# Patient Record
Sex: Male | Born: 1950 | Race: White | Hispanic: No | Marital: Married | Smoking: Former smoker
Health system: Southern US, Community
[De-identification: ages and names within clinical notes are randomized; demographics above are authoritative.]

## PROBLEM LIST (undated history)

## (undated) DIAGNOSIS — I10 Essential (primary) hypertension: Secondary | ICD-10-CM

## (undated) DIAGNOSIS — K429 Umbilical hernia without obstruction or gangrene: Secondary | ICD-10-CM

## (undated) DIAGNOSIS — E039 Hypothyroidism, unspecified: Secondary | ICD-10-CM

## (undated) DIAGNOSIS — E785 Hyperlipidemia, unspecified: Secondary | ICD-10-CM

## (undated) DIAGNOSIS — M1990 Unspecified osteoarthritis, unspecified site: Secondary | ICD-10-CM

## (undated) DIAGNOSIS — T7840XA Allergy, unspecified, initial encounter: Secondary | ICD-10-CM

## (undated) DIAGNOSIS — K469 Unspecified abdominal hernia without obstruction or gangrene: Secondary | ICD-10-CM

## (undated) DIAGNOSIS — I2510 Atherosclerotic heart disease of native coronary artery without angina pectoris: Secondary | ICD-10-CM

## (undated) DIAGNOSIS — N183 Chronic kidney disease, stage 3 unspecified: Secondary | ICD-10-CM

## (undated) HISTORY — DX: Hypothyroidism, unspecified: E03.9

## (undated) HISTORY — DX: Unspecified osteoarthritis, unspecified site: M19.90

## (undated) HISTORY — DX: Umbilical hernia without obstruction or gangrene: K42.9

## (undated) HISTORY — PX: KNEE CARTILAGE SURGERY: SHX688

## (undated) HISTORY — DX: Atherosclerotic heart disease of native coronary artery without angina pectoris: I25.10

## (undated) HISTORY — DX: Hyperlipidemia, unspecified: E78.5

## (undated) HISTORY — DX: Allergy, unspecified, initial encounter: T78.40XA

## (undated) HISTORY — DX: Chronic kidney disease, stage 3 unspecified: N18.30

## (undated) HISTORY — DX: Essential (primary) hypertension: I10

## (undated) HISTORY — DX: Unspecified abdominal hernia without obstruction or gangrene: K46.9

---

## 2006-11-14 ENCOUNTER — Emergency Department: Payer: Self-pay | Admitting: Emergency Medicine

## 2006-11-14 ENCOUNTER — Other Ambulatory Visit: Payer: Self-pay

## 2015-01-17 DIAGNOSIS — K429 Umbilical hernia without obstruction or gangrene: Secondary | ICD-10-CM

## 2015-01-17 HISTORY — DX: Umbilical hernia without obstruction or gangrene: K42.9

## 2015-03-04 ENCOUNTER — Encounter: Payer: Self-pay | Admitting: Family Medicine

## 2015-03-04 ENCOUNTER — Ambulatory Visit (INDEPENDENT_AMBULATORY_CARE_PROVIDER_SITE_OTHER): Payer: Medicare Other | Admitting: Family Medicine

## 2015-03-04 ENCOUNTER — Ambulatory Visit
Admission: RE | Admit: 2015-03-04 | Discharge: 2015-03-04 | Disposition: A | Discharge status: Home / Self Care | Payer: Medicare Other | Source: Ambulatory Visit | Attending: Family Medicine | Admitting: Family Medicine

## 2015-03-04 VITALS — BP 196/100 | HR 69 | Temp 97.5°F | Ht 71.6 in | Wt 229.0 lb

## 2015-03-04 DIAGNOSIS — K429 Umbilical hernia without obstruction or gangrene: Secondary | ICD-10-CM

## 2015-03-04 DIAGNOSIS — Z1159 Encounter for screening for other viral diseases: Secondary | ICD-10-CM | POA: Diagnosis not present

## 2015-03-04 DIAGNOSIS — Z13220 Encounter for screening for lipoid disorders: Secondary | ICD-10-CM | POA: Diagnosis not present

## 2015-03-04 DIAGNOSIS — I10 Essential (primary) hypertension: Secondary | ICD-10-CM

## 2015-03-04 DIAGNOSIS — Z125 Encounter for screening for malignant neoplasm of prostate: Secondary | ICD-10-CM

## 2015-03-04 DIAGNOSIS — Z114 Encounter for screening for human immunodeficiency virus [HIV]: Secondary | ICD-10-CM | POA: Diagnosis not present

## 2015-03-04 DIAGNOSIS — M160 Bilateral primary osteoarthritis of hip: Secondary | ICD-10-CM | POA: Diagnosis not present

## 2015-03-04 DIAGNOSIS — M25551 Pain in right hip: Secondary | ICD-10-CM | POA: Diagnosis present

## 2015-03-04 MED ORDER — MELOXICAM 15 MG PO TABS: 15.0000 mg | ORAL_TABLET | Freq: Every day | ORAL | Status: DC

## 2015-03-04 MED ORDER — LISINOPRIL 10 MG PO TABS: 10.0000 mg | ORAL_TABLET | Freq: Every day | ORAL | Status: DC

## 2015-03-04 NOTE — Assessment & Plan Note (Signed)
Concern for significant arthritis. Will obtain x-ray and start exercises and mobic. If significant arthritis, will refer to orthopedist.

## 2015-03-04 NOTE — Progress Notes (Signed)
BP 196/100 mmHg  Pulse 69  Temp(Src) 97.5 F (36.4 C)  Ht 5' 11.6" (1.819 m)  Wt 229 lb (103.874 kg)  BMI 31.39 kg/m2  SpO2 96%   Subjective:    Patient ID: Kenneth Johnston, male    DOB: 1951-01-12, 65 y.o.   MRN: [id number]  HPI: MCKADE Johnston is a 65 y.o. male who presents today to establish care. Hasn't seen a doctor in a very very long time.  Chief Complaint  Patient presents with  . Back Pain  . Hip Pain   HIP PAIN Duration: 4-5 months Involved hip: right and into the back Mechanism of injury: unknown Location: posterior Onset: sudden  Severity: 7/10  Quality: tight, sharp and tearing Frequency: constant Radiation: yes into his back Aggravating factors: walking and bending   Alleviating factors: chiropractry and laying in the recliner  Status: worse Treatments attempted: rest, ice, heat, APAP, ibuprofen, aleve and chiropractor   Relief with NSAIDs?: no Weakness with weight bearing: no Weakness with walking: no Paresthesias / decreased sensation: no Swelling: no Redness:no Fevers: no  ELEVATED BLOOD PRESSURE Duration of elevated BP: unknown BP monitoring frequency: not checking Previous BP meds: none Recent stressors: no Family history of hypertension: no Recurrent headaches: no Visual changes: no Palpitations: no  Dyspnea: yes Chest pain: no Lower extremity edema: no Dizzy/lightheaded: yes going from sitting to standing Transient ischemic attacks:no  HERNIA Duration: x 8 years Location:  belly button Painful: no Discomfort: yes Bulge: yes Quality:  tearing Onset: sudden Severity: moderate Context: bigger and better Aggravating factors: lifting  Relevant past medical, surgical, family and social history reviewed and updated as indicated. Interim medical history since our last visit reviewed. Allergies and medications reviewed and updated.  Review of Systems  Constitutional: Negative.   Respiratory: Negative.   Cardiovascular:  Negative.   Musculoskeletal: Positive for back pain, arthralgias and gait problem. Negative for myalgias, joint swelling, neck pain and neck stiffness.  Psychiatric/Behavioral: Negative.     Per HPI unless specifically indicated above     Objective:    BP 196/100 mmHg  Pulse 69  Temp(Src) 97.5 F (36.4 C)  Ht 5' 11.6" (1.819 m)  Wt 229 lb (103.874 kg)  BMI 31.39 kg/m2  SpO2 96%  Wt Readings from Last 3 Encounters:  03/04/15 229 lb (103.874 kg)    Physical Exam  Constitutional: He is oriented to person, place, and time. He appears well-developed and well-nourished. No distress.  HENT:  Head: Normocephalic and atraumatic.  Right Ear: Hearing normal.  Left Ear: Hearing normal.  Nose: Nose normal.  Eyes: Conjunctivae and lids are normal. Right eye exhibits no discharge. Left eye exhibits no discharge. No scleral icterus.  Cardiovascular: Normal rate, regular rhythm, normal heart sounds and intact distal pulses.  Exam reveals no gallop and no friction rub.   No murmur heard. Pulmonary/Chest: Effort normal and breath sounds normal. No respiratory distress. He has no wheezes. He has no rales. He exhibits no tenderness.  Abdominal:  Hernia at the belly button   Musculoskeletal: Normal range of motion.  Neurological: He is alert and oriented to person, place, and time.  Skin: Skin is warm, dry and intact. No rash noted. No erythema. No pallor.  Psychiatric: He has a normal mood and affect. His speech is normal and behavior is normal. Judgment and thought content normal. Cognition and memory are normal.  Nursing note and vitals reviewed. Hip Exam: Right     Tenderness to palpation:  Greater trochanter: no      Anterior superior iliac spine: yes     Anterior hip: yes     Iliac crest: yes     Iliac tubercle: yes     Pubic tubercle: yes     SI joint: yes      Range of Motion:     Flexion: Decreased    Extension: Decreased    Abduction: Decreased    Adduction: Decreased     Internal rotation: Decreased    External rotation: Decreased     Muscle Strength:  5/5 bilaterally     Special Tests:    Ober test: negative    FABER test:positive    No results found for this or any previous visit.    Assessment & Plan:   Problem List Items Addressed This Visit      Cardiovascular and Mediastinum   HTN (hypertension)    Poorly controlled today. Will start him on lisinopril and recheck in 2 weeks. Labs checked today.      Relevant Medications   lisinopril (PRINIVIL,ZESTRIL) 10 MG tablet   Other Relevant Orders   CBC with Differential/Platelet   Comprehensive metabolic panel   Microalbumin, Urine Waived   TSH     Other   Pain in right hip - Primary    Concern for significant arthritis. Will obtain x-ray and start exercises and mobic. If significant arthritis, will refer to orthopedist.      Relevant Orders   DG HIP UNILAT WITH PELVIS 2-3 VIEWS RIGHT   Umbilical hernia    Not strangulated. Would like to see surgery. Referral generated today.      Relevant Orders   Ambulatory referral to General Surgery    Other Visit Diagnoses    Screening for HIV without presence of risk factors        Labs drawn today. No risk. Await results.     Need for hepatitis C screening test        Labs drawn today. No risk. Await results.     Relevant Orders    HIV antibody    Hepatitis C Antibody    Screening for prostate cancer        Labs drawn today.  Await results.     Relevant Orders    PSA    Screening for cholesterol level        Labs drawn today. Await results.     Relevant Orders    Lipid Panel w/o Chol/HDL Ratio        Follow up plan: Return in about 2 weeks (around 03/18/2015) for recheck BP.

## 2015-03-04 NOTE — Patient Instructions (Signed)
Generic Hip Exercises RANGE OF MOTION (ROM) AND STRETCHING EXERCISES  These exercises may help you when beginning to rehabilitate your injury. Doing them too aggressively can worsen your condition. Complete them slowly and gently. Your symptoms may resolve with or without further involvement from your physician, physical therapist or athletic trainer. While completing these exercises, remember:   Restoring tissue flexibility helps normal motion to return to the joints. This allows healthier, less painful movement and activity.  An effective stretch should be held for at least 30 seconds.  A stretch should never be painful. You should only feel a gentle lengthening or release in the stretched tissue. If these stretches worsen your symptoms even when done gently, consult your physician, physical therapist or athletic trainer. STRETCH - Hamstrings, Supine   Lie on your back. Loop a belt or towel over the ball of your right / left foot.  Straighten your right / left knee and slowly pull on the belt to raise your leg. Do not allow the right / left knee to bend. Keep your opposite leg flat on the floor.  Raise the leg until you feel a gentle stretch behind your right / left knee or thigh. Hold this position for __________ seconds. Repeat __________ times. Complete this stretch __________ times per day.  STRETCH - Hip Rotators   Lie on your back on a firm surface. Grasp your right / left knee with your right / left hand and your ankle with your opposite hand.  Keeping your hips and shoulders firmly planted, gently pull your right / left knee and rotate your lower leg toward your opposite shoulder until you feel a stretch in your buttocks.  Hold this stretch for __________ seconds. Repeat this stretch __________ times. Complete this stretch __________ times per day. STRETCH - Hamstrings/Adductors, V-Sit   Sit on the floor with your legs extended in a large "V," keeping your knees straight.  With  your head and chest upright, bend at your waist reaching for your right foot to stretch your left adductors.  You should feel a stretch in your left inner thigh. Hold for __________ seconds.  Return to the upright position to relax your leg muscles.  Continuing to keep your chest upright, bend straight forward at your waist to stretch your hamstrings.  You should feel a stretch behind both of your thighs and/or knees. Hold for __________ seconds.  Return to the upright position to relax your leg muscles.  Repeat steps 2 through 4 for opposite leg. Repeat __________ times. Complete this exercise __________ times per day.  STRETCHING - Hip Flexors, Lunge  Half kneel with your right / left knee on the floor and your opposite knee bent and directly over your ankle.  Keep good posture with your head over your shoulders. Tighten your buttocks to point your tailbone downward; this will prevent your back from arching too much.  You should feel a gentle stretch in the front of your thigh and/or hip. If you do not feel any resistance, slightly slide your opposite foot forward and then slowly lunge forward so your knee once again lines up over your ankle. Be sure your tailbone remains pointed downward.  Hold this stretch for __________ seconds. Repeat __________ times. Complete this stretch __________ times per day. STRENGTHENING EXERCISES These exercises may help you when beginning to rehabilitate your injury. They may resolve your symptoms with or without further involvement from your physician, physical therapist or athletic trainer. While completing these exercises, remember:     Muscles can gain both the endurance and the strength needed for everyday activities through controlled exercises.  Complete these exercises as instructed by your physician, physical therapist or athletic trainer. Progress the resistance and repetitions only as guided.  You may experience muscle soreness or fatigue, but  the pain or discomfort you are trying to eliminate should never worsen during these exercises. If this pain does worsen, stop and make certain you are following the directions exactly. If the pain is still present after adjustments, discontinue the exercise until you can discuss the trouble with your clinician. STRENGTH - Hip Extensors, Bridge   Lie on your back on a firm surface. Bend your knees and place your feet flat on the floor.  Tighten your buttocks muscles and lift your bottom off the floor until your trunk is level with your thighs. You should feel the muscles in your buttocks and back of your thighs working. If you do not feel these muscles, slide your feet 1-2 inches further away from your buttocks.  Hold this position for __________ seconds.  Slowly lower your hips to the starting position and allow your buttock muscles relax completely before beginning the next repetition.  If this exercise is too easy, you may cross your arms over your chest. Repeat __________ times. Complete this exercise __________ times per day.  STRENGTH - Hip Abductors, Straight Leg Raises  Be aware of your form throughout the entire exercise so that you exercise the correct muscles. Sloppy form means that you are not strengthening the correct muscles.  Lie on your side so that your head, shoulders, knee and hip line up. You may bend your lower knee to help maintain your balance. Your right / left leg should be on top.  Roll your hips slightly forward, so that your hips are stacked directly over each other and your right / left knee is facing forward.  Lift your top leg up 4-6 inches, leading with your heel. Be sure that your foot does not drift forward or that your knee does not roll toward the ceiling.  Hold this position for __________ seconds. You should feel the muscles in your outer hip lifting (you may not notice this until your leg begins to tire).  Slowly lower your leg to the starting position.  Allow the muscles to fully relax before beginning the next repetition. Repeat __________ times. Complete this exercise __________ times per day.  STRENGTH - Hip Adductors, Straight Leg Raises   Lie on your side so that your head, shoulders, knee and hip line up. You may place your upper foot in front to help maintain your balance. Your right / left leg should be on the bottom.  Roll your hips slightly forward, so that your hips are stacked directly over each other and your right / left knee is facing forward.  Tense the muscles in your inner thigh and lift your bottom leg 4-6 inches. Hold this position for __________ seconds.  Slowly lower your leg to the starting position. Allow the muscles to fully relax before beginning the next repetition. Repeat __________ times. Complete this exercise __________ times per day.  STRENGTH - Quadriceps, Straight Leg Raises  Quality counts! Watch for signs that the quadriceps muscle is working to insure you are strengthening the correct muscles and not "cheating" by substituting with healthier muscles.  Lay on your back with your right / left leg extended and your opposite knee bent.  Tense the muscles in the front of your right /   left thigh. You should see either your knee cap slide up or increased dimpling just above the knee. Your thigh may even quiver.  Tighten these muscles even more and raise your leg 4 to 6 inches off the floor. Hold for right / left seconds.  Keeping these muscles tense, lower your leg.  Relax the muscles slowly and completely in between each repetition. Repeat __________ times. Complete this exercise __________ times per day.  STRENGTH - Hip Abductors, Standing  Tie one end of a rubber exercise band/tubing to a secure surface (table, pole) and tie a loop at the other end.  Place the loop around your right / left ankle. Keeping your ankle with the band directly opposite of the secured end, step away until there is tension in  the tube/band.  Hold onto a chair as needed for balance.  Keeping your back upright, your shoulders over your hips, and your toes pointing forward, lift your right / left leg out to your side. Be sure to lift your leg with your hip muscles. Do not "throw" your leg or tip your body to lift your leg.  Slowly and with control, return to the starting position. Repeat exercise __________ times. Complete this exercise __________ times per day.  STRENGTH - Quadriceps, Squats  Stand in a door frame so that your feet and knees are in line with the frame.  Use your hands for balance, not support, on the frame.  Slowly lower your weight, bending at the hips and knees. Keep your lower legs upright so that they are parallel with the door frame. Squat only within the range that does not increase your knee pain. Never let your hips drop below your knees.  Slowly return upright, pushing with your legs, not pulling with your hands.   This information is not intended to replace advice given to you by your health care provider. Make sure you discuss any questions you have with your health care provider.   Document Released: 01/20/2005 Document Revised: 01/23/2014 Document Reviewed: 04/16/2008 Elsevier Interactive Patient Education 2016 [employer]  

## 2015-03-04 NOTE — Assessment & Plan Note (Signed)
Not strangulated. Would like to see surgery. Referral generated today.

## 2015-03-04 NOTE — Assessment & Plan Note (Signed)
Poorly controlled today. Will start him on lisinopril and recheck in 2 weeks. Labs checked today.

## 2015-03-05 ENCOUNTER — Telehealth: Payer: Self-pay | Admitting: Family Medicine

## 2015-03-05 ENCOUNTER — Telehealth: Payer: Self-pay | Admitting: Gastroenterology

## 2015-03-05 DIAGNOSIS — M160 Bilateral primary osteoarthritis of hip: Secondary | ICD-10-CM

## 2015-03-05 LAB — PSA: Prostate Specific Ag, Serum: 1.7 ng/mL (ref 0.0–4.0)

## 2015-03-05 LAB — MICROALBUMIN, URINE WAIVED
CREATININE, URINE WAIVED: 200 mg/dL (ref 10–300)
MICROALB, UR WAIVED: 80 mg/L — AB (ref 0–19)

## 2015-03-05 LAB — LIPID PANEL W/O CHOL/HDL RATIO
CHOLESTEROL TOTAL: 146 mg/dL (ref 100–199)
HDL: 34 mg/dL — ABNORMAL LOW (ref >39)
LDL Calculated: 87 mg/dL (ref 0–99)
Triglycerides: 126 mg/dL (ref 0–149)
VLDL CHOLESTEROL CAL: 25 mg/dL (ref 5–40)

## 2015-03-05 LAB — COMPREHENSIVE METABOLIC PANEL
A/G RATIO: 1.8 (ref 1.1–2.5)
ALK PHOS: 76 IU/L (ref 39–117)
ALT: 15 IU/L (ref 0–44)
AST: 15 IU/L (ref 0–40)
Albumin: 4.4 g/dL (ref 3.6–4.8)
BILIRUBIN TOTAL: 0.3 mg/dL (ref 0.0–1.2)
BUN/Creatinine Ratio: 11 (ref 10–22)
BUN: 12 mg/dL (ref 8–27)
CHLORIDE: 102 mmol/L (ref 96–106)
CO2: 22 mmol/L (ref 18–29)
Calcium: 9.4 mg/dL (ref 8.6–10.2)
Creatinine, Ser: 1.07 mg/dL (ref 0.76–1.27)
GFR calc Af Amer: 83 mL/min/{1.73_m2} (ref >59)
GFR calc non Af Amer: 72 mL/min/{1.73_m2} (ref >59)
GLOBULIN, TOTAL: 2.5 g/dL (ref 1.5–4.5)
Glucose: 91 mg/dL (ref 65–99)
POTASSIUM: 4 mmol/L (ref 3.5–5.2)
SODIUM: 144 mmol/L (ref 134–144)
Total Protein: 6.9 g/dL (ref 6.0–8.5)

## 2015-03-05 LAB — CBC WITH DIFFERENTIAL/PLATELET
BASOS: 1 %
Basophils Absolute: 0 10*3/uL (ref 0.0–0.2)
EOS (ABSOLUTE): 0.1 10*3/uL (ref 0.0–0.4)
EOS: 1 %
HEMATOCRIT: 43.8 % (ref 37.5–51.0)
Hemoglobin: 15.1 g/dL (ref 12.6–17.7)
IMMATURE GRANULOCYTES: 0 %
Immature Grans (Abs): 0 10*3/uL (ref 0.0–0.1)
LYMPHS ABS: 1.5 10*3/uL (ref 0.7–3.1)
Lymphs: 19 %
MCH: 28.6 pg (ref 26.6–33.0)
MCHC: 34.5 g/dL (ref 31.5–35.7)
MCV: 83 fL (ref 79–97)
MONOS ABS: 0.6 10*3/uL (ref 0.1–0.9)
Monocytes: 7 %
NEUTROS PCT: 72 %
Neutrophils Absolute: 6 10*3/uL (ref 1.4–7.0)
PLATELETS: 216 10*3/uL (ref 150–379)
RBC: 5.28 x10E6/uL (ref 4.14–5.80)
RDW: 14.3 % (ref 12.3–15.4)
WBC: 8.2 10*3/uL (ref 3.4–10.8)

## 2015-03-05 LAB — HIV ANTIBODY (ROUTINE TESTING W REFLEX): HIV SCREEN 4TH GENERATION: NONREACTIVE

## 2015-03-05 LAB — HEPATITIS C ANTIBODY

## 2015-03-05 LAB — TSH: TSH: 2.21 u[IU]/mL (ref 0.450–4.500)

## 2015-03-05 NOTE — Telephone Encounter (Signed)
I have called patient to make an appointment with general surgery for Umbilical hernia without obstruction and without gangrene per referral. No answer. I have left a detailed message.

## 2015-03-05 NOTE — Telephone Encounter (Signed)
Please let him know that his labs came back normal. His x-ray shows a lot of arthritis, so I put in the referral for him to see the orthopedist. He should be hearing from them shortly. Thanks!

## 2015-03-05 NOTE — Telephone Encounter (Signed)
Patient notified

## 2015-03-10 ENCOUNTER — Encounter (INDEPENDENT_AMBULATORY_CARE_PROVIDER_SITE_OTHER): Payer: Self-pay

## 2015-03-10 ENCOUNTER — Encounter: Payer: Self-pay | Admitting: Surgery

## 2015-03-10 ENCOUNTER — Ambulatory Visit (INDEPENDENT_AMBULATORY_CARE_PROVIDER_SITE_OTHER): Payer: Medicare Other | Admitting: Surgery

## 2015-03-10 VITALS — BP 188/100 | HR 62 | Temp 98.0°F | Ht 71.0 in | Wt 229.0 lb

## 2015-03-10 DIAGNOSIS — K429 Umbilical hernia without obstruction or gangrene: Secondary | ICD-10-CM | POA: Diagnosis not present

## 2015-03-10 DIAGNOSIS — K469 Unspecified abdominal hernia without obstruction or gangrene: Secondary | ICD-10-CM | POA: Insufficient documentation

## 2015-03-10 DIAGNOSIS — K4020 Bilateral inguinal hernia, without obstruction or gangrene, not specified as recurrent: Secondary | ICD-10-CM

## 2015-03-10 NOTE — Progress Notes (Signed)
Subjective:     Patient ID: Kenneth Johnston, male   DOB: 1950/02/20, 65 y.o.   MRN: [id number]  HPI   65 year old male who comes in today complaining of a umbilical hernia as well as some pain in the right groin. Patient states that he's had this hernia for approximately 7 years. Patient states that occasionally it will cause him pain and bulge out and has some discoloration but always goes back and at the end of the day. Patient states that about 2 months ago he had picked up something heavy from the ground and irritated the umbilical area as well as felt something pop in his right groin. His right groin he has had similar discomfort fillings although not acute pain and it comes up whenever he strains. Patient denies ever having bulging out in the right groin and having depressed back in. Patient denies having any pain that goes down to the testicle or down his leg. Patient had a colonoscopy somewhere between 8-10 years ago and he did not have polyps at that time does not have a family history of colon cancer and has not had any changes in his bowels. Patient denies any fever chills malaise weight loss shortness of breath chest pain abdominal pain nausea vomiting diarrhea constipation or dysuria.  Past Medical History  Diagnosis Date  . Umbilical hernia 2017  . Arthritis   . Allergy    Past Surgical History  Procedure Laterality Date  . Knee cartilage surgery Bilateral    Family History  Problem Relation Age of Onset  . Kidney failure Mother   . Aneurysm Father     brain  . Kidney disease Paternal Grandmother   . Heart attack Paternal Grandfather    Social History   Social History  . Marital Status: Married    Spouse Name: N/A  . Number of Children: N/A  . Years of Education: N/A   Social History Main Topics  . Smoking status: Former Smoker    Quit date: 03/04/1995  . Smokeless tobacco: Never Used  . Alcohol Use: Yes     Comment: on occasion  . Drug Use: No  . Sexual Activity:  Yes   Other Topics Concern  . None   Social History Narrative    Current outpatient prescriptions:  .  lisinopril (PRINIVIL,ZESTRIL) 10 MG tablet, Take 1 tablet (10 mg total) by mouth daily., Disp: 30 tablet, Rfl: 1 No Known Allergies     Review of Systems  Constitutional: Negative for fever, chills, activity change, appetite change, fatigue and unexpected weight change.  HENT: Negative for congestion, postnasal drip and sore throat.   Respiratory: Negative for cough, shortness of breath and wheezing.   Cardiovascular: Negative for chest pain, palpitations and leg swelling.  Gastrointestinal: Negative for nausea, vomiting, abdominal pain, diarrhea, constipation, blood in stool and abdominal distention.  Endocrine: Negative for cold intolerance, heat intolerance, polydipsia, polyphagia and polyuria.  Genitourinary: Positive for testicular pain. Negative for dysuria, hematuria, flank pain and difficulty urinating.  Musculoskeletal: Positive for back pain and arthralgias. Negative for myalgias, joint swelling, gait problem, neck pain and neck stiffness.  Skin: Negative for color change, pallor, rash and wound.  Allergic/Immunologic: Negative for environmental allergies and food allergies.  Neurological: Negative for dizziness, weakness and light-headedness.  Hematological: Negative for adenopathy. Does not bruise/bleed easily.  Psychiatric/Behavioral: Negative for decreased concentration and agitation. The patient is not nervous/anxious.   All other systems reviewed and are negative.      Filed Vitals:  03/10/15 0841  BP: 188/100  Pulse: 62  Temp: 98 F (36.7 C)     Objective:   Physical Exam  Constitutional: He is oriented to person, place, and time. He appears well-developed and well-nourished. No distress.  HENT:  Head: Normocephalic and atraumatic.  Right Ear: External ear normal.  Left Ear: External ear normal.  Nose: Nose normal.  Mouth/Throat: Oropharynx is clear  and moist.  Eyes: Conjunctivae and EOM are normal. Pupils are equal, round, and reactive to light. No scleral icterus.  Neck: Normal range of motion. Neck supple. No JVD present. No tracheal deviation present.  Cardiovascular: Normal rate, regular rhythm, normal heart sounds and intact distal pulses.  Exam reveals no gallop and no friction rub.   No murmur heard. Pulmonary/Chest: Effort normal and breath sounds normal. No respiratory distress. He has no wheezes. He has no rales.  Abdominal: Soft. Bowel sounds are normal. He exhibits no distension and no mass. There is no tenderness. There is no rebound and no guarding.  2cm umbilical hernia easily reducible, slight tenderness with direct pressure  Genitourinary: Penis normal. No penile tenderness.  Right groin with small 3cm defect felt with coughing/straining, no incarceration  Left groin with small 2cm defect felt with coughing, no incarceration   Scrotum and testicles normal without any lesions Penis normal, no lesions.   Musculoskeletal: Normal range of motion. He exhibits no edema or tenderness.  Neurological: He is alert and oriented to person, place, and time. No cranial nerve deficit.  Skin: Skin is warm and dry. No rash noted. No erythema. No pallor.  Psychiatric: He has a normal mood and affect. His behavior is normal. Judgment and thought content normal.  Vitals reviewed.      Assessment:     65 yr old with Incarcerated Umbilical hernia and Bilateral inguinal hernias    Plan:      I have personally reviewed the patient's past medical history only having hypertension with his doctor Dr. Laural Benes is working on just recently started him on lisinopril. His blood pressure has improved minimally but he has no other symptoms. He is to see her in 2 weeks. I have reviewed his  Labs without any abnormalities.   He does have an incarcerated umbilical hernia that bothers him as well as a right groin hernia has been causing him symptoms. I  discussed with him that given his job with high physical activity would recommend repairing both of these. I discussed the risk, benefits, alternatives and expected outcomes including nonoperative management and observation. I discussed the risk of bleeding , infection , recurrence, need for mesh placement, mesh infection which would require removal , mesh migration and risk of anesthesia of heart attack stroke and blood clots.   I also discussed with the patient that sensitive been 10 years since his last colonoscopy I would like for him to have one prior to repairing the hernias. Since his GI doctor is now deceased will refer him to my partner Dr. Servando Snare.   We'll have him return in 3 weeks to see if his hypertension has improved and to set up the surgery

## 2015-03-18 ENCOUNTER — Ambulatory Visit (INDEPENDENT_AMBULATORY_CARE_PROVIDER_SITE_OTHER): Payer: Medicare Other | Admitting: Family Medicine

## 2015-03-18 ENCOUNTER — Encounter: Payer: Self-pay | Admitting: Family Medicine

## 2015-03-18 VITALS — BP 148/90 | HR 60 | Temp 97.3°F | Ht 71.6 in | Wt 228.0 lb

## 2015-03-18 DIAGNOSIS — I10 Essential (primary) hypertension: Secondary | ICD-10-CM

## 2015-03-18 MED ORDER — LISINOPRIL 5 MG PO TABS: 7.5000 mg | ORAL_TABLET | Freq: Every day | ORAL | Status: DC

## 2015-03-18 MED ORDER — MELOXICAM 15 MG PO TABS: 15.0000 mg | ORAL_TABLET | Freq: Every day | ORAL | Status: DC

## 2015-03-18 NOTE — Progress Notes (Signed)
BP 148/90 mmHg  Pulse 60  Temp(Src) 97.3 F (36.3 C)  Ht 5' 11.6" (1.819 m)  Wt 228 lb (103.42 kg)  BMI 31.26 kg/m2  SpO2 96%   Subjective:    Patient ID: Kenneth Johnston, male    DOB: 07/18/1950, 65 y.o.   MRN: [id number]  HPI: Kenneth Johnston is a 65 y.o. male  Chief Complaint  Patient presents with  . Hypertension   HYPERTENSION- Only taking  daily Hypertension status: better  Satisfied with current treatment? yes Duration of hypertension: unknown BP monitoring frequency:  not checking BP medication side effects:  no Medication compliance: excellent compliance Aspirin: no Recurrent headaches: no Visual changes: no Palpitations: no Dyspnea: no Chest pain: no Lower extremity edema: no Dizzy/lightheaded: no  Relevant past medical, surgical, family and social history reviewed and updated as indicated. Interim medical history since our last visit reviewed. Allergies and medications reviewed and updated.  Review of Systems  Constitutional: Negative.   Respiratory: Negative.   Cardiovascular: Negative.   Psychiatric/Behavioral: Negative.     Per HPI unless specifically indicated above     Objective:    BP 148/90 mmHg  Pulse 60  Temp(Src) 97.3 F (36.3 C)  Ht 5' 11.6" (1.819 m)  Wt 228 lb (103.42 kg)  BMI 31.26 kg/m2  SpO2 96%  Wt Readings from Last 3 Encounters:  03/18/15 228 lb (103.42 kg)  03/10/15 229 lb (103.874 kg)  03/04/15 229 lb (103.874 kg)    Physical Exam  Constitutional: He is oriented to person, place, and time. He appears well-developed and well-nourished. No distress.  HENT:  Head: Normocephalic and atraumatic.  Right Ear: Hearing normal.  Left Ear: Hearing normal.  Nose: Nose normal.  Eyes: Conjunctivae and lids are normal. Right eye exhibits no discharge. Left eye exhibits no discharge. No scleral icterus.  Cardiovascular: Normal rate, regular rhythm, normal heart sounds and intact distal pulses.  Exam reveals no gallop and no  friction rub.   No murmur heard. Pulmonary/Chest: Effort normal and breath sounds normal. No respiratory distress. He has no wheezes. He has no rales. He exhibits no tenderness.  Musculoskeletal: Normal range of motion.  Neurological: He is alert and oriented to person, place, and time.  Skin: Skin is warm, dry and intact. No rash noted. He is not diaphoretic. No erythema. No pallor.  Psychiatric: He has a normal mood and affect. His speech is normal and behavior is normal. Judgment and thought content normal. Cognition and memory are normal.  Nursing note and vitals reviewed.   Results for orders placed or performed in visit on 03/04/15  CBC with Differential/Platelet  Result Value Ref Range   WBC 8.2 3.4 - 10.8 x10E3/uL   RBC 5.28 4.14 - 5.80 x10E6/uL   Hemoglobin 15.1 12.6 - 17.7 g/dL   Hematocrit 19.1 47.8 - 51.0 %   MCV 83 79 - 97 fL   MCH 28.6 26.6 - 33.0 pg   MCHC 34.5 31.5 - 35.7 g/dL   RDW 29.5 62.1 - 30.8 %   Platelets 216 150 - 379 x10E3/uL   Neutrophils 72 %   Lymphs 19 %   Monocytes 7 %   Eos 1 %   Basos 1 %   Neutrophils Absolute 6.0 1.4 - 7.0 x10E3/uL   Lymphocytes Absolute 1.5 0.7 - 3.1 x10E3/uL   Monocytes Absolute 0.6 0.1 - 0.9 x10E3/uL   EOS (ABSOLUTE) 0.1 0.0 - 0.4 x10E3/uL   Basophils Absolute 0.0 0.0 - 0.2 x10E3/uL  Immature Granulocytes 0 %   Immature Grans (Abs) 0.0 0.0 - 0.1 x10E3/uL  Comprehensive metabolic panel  Result Value Ref Range   Glucose 91 65 - 99 mg/dL   BUN 12 8 - 27 mg/dL   Creatinine, Ser 4.09 0.76 - 1.27 mg/dL   GFR calc non Af Amer 72 >59 mL/min/1.73   GFR calc Af Amer 83 >59 mL/min/1.73   BUN/Creatinine Ratio 11 10 - 22   Sodium 144 134 - 144 mmol/L   Potassium 4.0 3.5 - 5.2 mmol/L   Chloride 102 96 - 106 mmol/L   CO2 22 18 - 29 mmol/L   Calcium 9.4 8.6 - 10.2 mg/dL   Total Protein 6.9 6.0 - 8.5 g/dL   Albumin 4.4 3.6 - 4.8 g/dL   Globulin, Total 2.5 1.5 - 4.5 g/dL   Albumin/Globulin Ratio 1.8 1.1 - 2.5   Bilirubin Total  0.3 0.0 - 1.2 mg/dL   Alkaline Phosphatase 76 39 - 117 IU/L   AST 15 0 - 40 IU/L   ALT 15 0 - 44 IU/L  HIV antibody  Result Value Ref Range   HIV Screen 4th Generation wRfx Non Reactive Non Reactive  Hepatitis C Antibody  Result Value Ref Range   Hep C Virus Ab <0.1 0.0 - 0.9 s/co ratio  Lipid Panel w/o Chol/HDL Ratio  Result Value Ref Range   Cholesterol, Total 146 100 - 199 mg/dL   Triglycerides 811 0 - 149 mg/dL   HDL 34 (L) >91 mg/dL   VLDL Cholesterol Cal 25 5 - 40 mg/dL   LDL Calculated 87 0 - 99 mg/dL  PSA  Result Value Ref Range   Prostate Specific Ag, Serum 1.7 0.0 - 4.0 ng/mL  Microalbumin, Urine Waived  Result Value Ref Range   Microalb, Ur Waived 80 (H) 0 - 19 mg/L   Creatinine, Urine Waived 200 10 - 300 mg/dL   Microalb/Creat Ratio 30-300 (H) <30 mg/g  TSH  Result Value Ref Range   TSH 2.210 0.450 - 4.500 uIU/mL      Assessment & Plan:   Problem List Items Addressed This Visit      Cardiovascular and Mediastinum   HTN (hypertension) - Primary    Under better control,but still elevated. Will increase to 7.5mg  of lisinopril and check back in 1 month. Checking BMP today.      Relevant Medications   lisinopril (PRINIVIL,ZESTRIL) 5 MG tablet   Other Relevant Orders   Basic metabolic panel       Follow up plan: Return in about 4 weeks (around 04/15/2015) for Wellness exam.

## 2015-03-18 NOTE — Assessment & Plan Note (Addendum)
Under better control,but still elevated. Will increase to 7.5mg  of lisinopril and check back in 1 month. Checking BMP today.

## 2015-03-19 ENCOUNTER — Telehealth: Payer: Self-pay | Admitting: Family Medicine

## 2015-03-19 DIAGNOSIS — M545 Low back pain: Secondary | ICD-10-CM | POA: Diagnosis not present

## 2015-03-19 DIAGNOSIS — I10 Essential (primary) hypertension: Secondary | ICD-10-CM

## 2015-03-19 DIAGNOSIS — M25559 Pain in unspecified hip: Secondary | ICD-10-CM | POA: Diagnosis not present

## 2015-03-19 DIAGNOSIS — G8929 Other chronic pain: Secondary | ICD-10-CM | POA: Diagnosis not present

## 2015-03-19 LAB — BASIC METABOLIC PANEL
BUN / CREAT RATIO: 11 (ref 10–22)
BUN: 15 mg/dL (ref 8–27)
CO2: 23 mmol/L (ref 18–29)
Calcium: 9.3 mg/dL (ref 8.6–10.2)
Chloride: 101 mmol/L (ref 96–106)
Creatinine, Ser: 1.31 mg/dL — ABNORMAL HIGH (ref 0.76–1.27)
GFR calc Af Amer: 65 mL/min/{1.73_m2} (ref >59)
GFR, EST NON AFRICAN AMERICAN: 56 mL/min/{1.73_m2} — AB (ref >59)
Glucose: 91 mg/dL (ref 65–99)
POTASSIUM: 4 mmol/L (ref 3.5–5.2)
SODIUM: 142 mmol/L (ref 134–144)

## 2015-03-19 NOTE — Telephone Encounter (Signed)
Please let him know that his kidney function was up a little bit, probably because he wasn't drinking enough water. I'd like him to come back just for a lab visit in 2 weeks (order in) and have it repeated after drinking 2 big glasses of water. Thanks!

## 2015-03-19 NOTE — Telephone Encounter (Signed)
Patient notified and lab appointment scheduled. 

## 2015-04-02 ENCOUNTER — Other Ambulatory Visit: Payer: Medicare Other

## 2015-04-02 DIAGNOSIS — I10 Essential (primary) hypertension: Secondary | ICD-10-CM

## 2015-04-03 LAB — BASIC METABOLIC PANEL
BUN / CREAT RATIO: 11 (ref 10–22)
BUN: 14 mg/dL (ref 8–27)
CO2: 26 mmol/L (ref 18–29)
CREATININE: 1.29 mg/dL — AB (ref 0.76–1.27)
Calcium: 9.2 mg/dL (ref 8.6–10.2)
Chloride: 102 mmol/L (ref 96–106)
GFR, EST AFRICAN AMERICAN: 66 mL/min/{1.73_m2} (ref >59)
GFR, EST NON AFRICAN AMERICAN: 57 mL/min/{1.73_m2} — AB (ref >59)
GLUCOSE: 110 mg/dL — AB (ref 65–99)
Potassium: 3.9 mmol/L (ref 3.5–5.2)
SODIUM: 143 mmol/L (ref 134–144)

## 2015-04-05 ENCOUNTER — Telehealth: Payer: Self-pay | Admitting: Family Medicine

## 2015-04-05 MED ORDER — AMLODIPINE BESYLATE 2.5 MG PO TABS: 2.5000 mg | ORAL_TABLET | Freq: Every day | ORAL | Status: DC

## 2015-04-05 NOTE — Telephone Encounter (Signed)
Patient notified

## 2015-04-05 NOTE — Telephone Encounter (Signed)
Please let him know that his kidney function got a little better, but not enough. I'd like him to stop his lisinopril and start the new medicine I'm sending through to his pharmacy and we'll recheck his BP and labs at an appointment in 4 weeks (if you could schedule that it'd be great!)

## 2015-04-20 ENCOUNTER — Other Ambulatory Visit: Payer: Self-pay | Admitting: Family Medicine

## 2015-04-30 ENCOUNTER — Encounter: Payer: Self-pay | Admitting: Family Medicine

## 2015-04-30 ENCOUNTER — Ambulatory Visit (INDEPENDENT_AMBULATORY_CARE_PROVIDER_SITE_OTHER): Payer: Medicare Other | Admitting: Family Medicine

## 2015-04-30 VITALS — BP 152/80 | HR 64 | Temp 98.0°F | Ht 71.6 in | Wt 229.2 lb

## 2015-04-30 DIAGNOSIS — I10 Essential (primary) hypertension: Secondary | ICD-10-CM | POA: Diagnosis not present

## 2015-04-30 MED ORDER — AMLODIPINE BESYLATE 5 MG PO TABS: 5.0000 mg | ORAL_TABLET | Freq: Every day | ORAL | Status: DC

## 2015-04-30 MED ORDER — MELOXICAM 15 MG PO TABS: 15.0000 mg | ORAL_TABLET | Freq: Every day | ORAL | Status: DC

## 2015-04-30 NOTE — Progress Notes (Signed)
BP 152/80 mmHg  Pulse 64  Temp(Src) 98 F (36.7 C)  Ht 5' 11.6" (1.819 m)  Wt 229 lb 3.2 oz (103.964 kg)  BMI 31.42 kg/m2  SpO2 97%   Subjective:    Patient ID: Kenneth Johnston, male    DOB: December 31, 1950, 65 y.o.   MRN: [id number]  HPI: Kenneth Johnston is a 65 y.o. male  Chief Complaint  Patient presents with  . Hypertension   HYPERTENSION Hypertension status: uncontrolled  Satisfied with current treatment? yes Duration of hypertension: chronic BP monitoring frequency:  not checking BP medication side effects:  no Medication compliance: excellent compliance Aspirin: no Recurrent headaches: no Visual changes: no Palpitations: no Dyspnea: no Chest pain: no Lower extremity edema: no Dizzy/lightheaded: no   Relevant past medical, surgical, family and social history reviewed and updated as indicated. Interim medical history since our last visit reviewed. Allergies and medications reviewed and updated.  Review of Systems  Constitutional: Negative.   Respiratory: Negative.   Cardiovascular: Negative.   Psychiatric/Behavioral: Negative.     Per HPI unless specifically indicated above     Objective:    BP 152/80 mmHg  Pulse 64  Temp(Src) 98 F (36.7 C)  Ht 5' 11.6" (1.819 m)  Wt 229 lb 3.2 oz (103.964 kg)  BMI 31.42 kg/m2  SpO2 97%  Wt Readings from Last 3 Encounters:  04/30/15 229 lb 3.2 oz (103.964 kg)  03/18/15 228 lb (103.42 kg)  03/10/15 229 lb (103.874 kg)    Physical Exam  Constitutional: He is oriented to person, place, and time. He appears well-developed and well-nourished. No distress.  HENT:  Head: Normocephalic and atraumatic.  Right Ear: Hearing normal.  Left Ear: Hearing normal.  Nose: Nose normal.  Eyes: Conjunctivae and lids are normal. Right eye exhibits no discharge. Left eye exhibits no discharge. No scleral icterus.  Cardiovascular: Normal rate, regular rhythm, normal heart sounds and intact distal pulses.  Exam reveals no gallop and  no friction rub.   No murmur heard. Pulmonary/Chest: Effort normal and breath sounds normal. No respiratory distress. He has no wheezes. He has no rales. He exhibits no tenderness.  Musculoskeletal: Normal range of motion.  Neurological: He is alert and oriented to person, place, and time.  Skin: Skin is warm, dry and intact. No rash noted. No erythema. No pallor.  Psychiatric: He has a normal mood and affect. His speech is normal and behavior is normal. Judgment and thought content normal. Cognition and memory are normal.  Nursing note and vitals reviewed.   Results for orders placed or performed in visit on 04/02/15  Basic metabolic panel  Result Value Ref Range   Glucose 110 (H) 65 - 99 mg/dL   BUN 14 8 - 27 mg/dL   Creatinine, Ser 4.09 (H) 0.76 - 1.27 mg/dL   GFR calc non Af Amer 57 (L) >59 mL/min/1.73   GFR calc Af Amer 66 >59 mL/min/1.73   BUN/Creatinine Ratio 11 10 - 22   Sodium 143 134 - 144 mmol/L   Potassium 3.9 3.5 - 5.2 mmol/L   Chloride 102 96 - 106 mmol/L   CO2 26 18 - 29 mmol/L   Calcium 9.2 8.6 - 10.2 mg/dL      Assessment & Plan:   Problem List Items Addressed This Visit      Cardiovascular and Mediastinum   HTN (hypertension) - Primary    Not under great control. Will increase to  daily and recheck in 1 month. Call with any  concerns. Continue DASH diet, labs rechecked today.      Relevant Medications   amLODipine (NORVASC) 5 MG tablet   Other Relevant Orders   Basic metabolic panel       Follow up plan: Return in about 4 weeks (around 05/28/2015) for BP check.

## 2015-04-30 NOTE — Assessment & Plan Note (Signed)
Not under great control. Will increase to 5mg  daily and recheck in 1 month. Call with any concerns. Continue DASH diet, labs rechecked today.

## 2015-04-30 NOTE — Patient Instructions (Signed)
DASH Eating Plan  DASH stands for "Dietary Approaches to Stop Hypertension." The DASH eating plan is a healthy eating plan that has been shown to reduce high blood pressure (hypertension). Additional health benefits may include reducing the risk of type 2 diabetes mellitus, heart disease, and stroke. The DASH eating plan may also help with weight loss.  WHAT DO I NEED TO KNOW ABOUT THE DASH EATING PLAN?  For the DASH eating plan, you will follow these general guidelines:  · Choose foods with a percent daily value for sodium of less than 5% (as listed on the food label).  · Use salt-free seasonings or herbs instead of table salt or sea salt.  · Check with your health care provider or pharmacist before using salt substitutes.  · Eat lower-sodium products, often labeled as "lower sodium" or "no salt added."  · Eat fresh foods.  · Eat more vegetables, fruits, and low-fat dairy products.  · Choose whole grains. Look for the word "whole" as the first word in the ingredient list.  · Choose fish and skinless chicken or turkey more often than red meat. Limit fish, poultry, and meat to 6 oz (170 g) each day.  · Limit sweets, desserts, sugars, and sugary drinks.  · Choose heart-healthy fats.  · Limit cheese to 1 oz (28 g) per day.  · Eat more home-cooked food and less restaurant, buffet, and fast food.  · Limit fried foods.  · Cook foods using methods other than frying.  · Limit canned vegetables. If you do use them, rinse them well to decrease the sodium.  · When eating at a restaurant, ask that your food be prepared with less salt, or no salt if possible.  WHAT FOODS CAN I EAT?  Seek help from a dietitian for individual calorie needs.  Grains  Whole grain or whole wheat bread. Brown rice. Whole grain or whole wheat pasta. Quinoa, bulgur, and whole grain cereals. Low-sodium cereals. Corn or whole wheat flour tortillas. Whole grain cornbread. Whole grain crackers. Low-sodium crackers.  Vegetables  Fresh or frozen vegetables  (raw, steamed, roasted, or grilled). Low-sodium or reduced-sodium tomato and vegetable juices. Low-sodium or reduced-sodium tomato sauce and paste. Low-sodium or reduced-sodium canned vegetables.   Fruits  All fresh, canned (in natural juice), or frozen fruits.  Meat and Other Protein Products  Ground beef (85% or leaner), grass-fed beef, or beef trimmed of fat. Skinless chicken or turkey. Ground chicken or turkey. Pork trimmed of fat. All fish and seafood. Eggs. Dried beans, peas, or lentils. Unsalted nuts and seeds. Unsalted canned beans.  Dairy  Low-fat dairy products, such as skim or 1% milk, 2% or reduced-fat cheeses, low-fat ricotta or cottage cheese, or plain low-fat yogurt. Low-sodium or reduced-sodium cheeses.  Fats and Oils  Tub margarines without trans fats. Light or reduced-fat mayonnaise and salad dressings (reduced sodium). Avocado. Safflower, olive, or canola oils. Natural peanut or almond butter.  Other  Unsalted popcorn and pretzels.  The items listed above may not be a complete list of recommended foods or beverages. Contact your dietitian for more options.  WHAT FOODS ARE NOT RECOMMENDED?  Grains  White bread. White pasta. White rice. Refined cornbread. Bagels and croissants. Crackers that contain trans fat.  Vegetables  Creamed or fried vegetables. Vegetables in a cheese sauce. Regular canned vegetables. Regular canned tomato sauce and paste. Regular tomato and vegetable juices.  Fruits  Dried fruits. Canned fruit in light or heavy syrup. Fruit juice.  Meat and Other Protein   Products  Fatty cuts of meat. Ribs, chicken wings, bacon, sausage, bologna, salami, chitterlings, fatback, hot dogs, bratwurst, and packaged luncheon meats. Salted nuts and seeds. Canned beans with salt.  Dairy  Whole or 2% milk, cream, half-and-half, and cream cheese. Whole-fat or sweetened yogurt. Full-fat cheeses or blue cheese. Nondairy creamers and whipped toppings. Processed cheese, cheese spreads, or cheese  curds.  Condiments  Onion and garlic salt, seasoned salt, table salt, and sea salt. Canned and packaged gravies. Worcestershire sauce. Tartar sauce. Barbecue sauce. Teriyaki sauce. Soy sauce, including reduced sodium. Steak sauce. Fish sauce. Oyster sauce. Cocktail sauce. Horseradish. Ketchup and mustard. Meat flavorings and tenderizers. Bouillon cubes. Hot sauce. Tabasco sauce. Marinades. Taco seasonings. Relishes.  Fats and Oils  Butter, stick margarine, lard, shortening, ghee, and bacon fat. Coconut, palm kernel, or palm oils. Regular salad dressings.  Other  Pickles and olives. Salted popcorn and pretzels.  The items listed above may not be a complete list of foods and beverages to avoid. Contact your dietitian for more information.  WHERE CAN I FIND MORE INFORMATION?  National Heart, Lung, and [employer] Institute: www.nhlbi.nih.gov/health/health-topics/topics/dash/     This information is not intended to replace advice given to you by your health care provider. Make sure you discuss any questions you have with your health care provider.     Document Released: 12/22/2010 Document Revised: 01/23/2014 Document Reviewed: 11/06/2012  Elsevier Interactive Patient Education ©2016 [employer]

## 2015-05-01 LAB — BASIC METABOLIC PANEL
BUN/Creatinine Ratio: 15 (ref 10–24)
BUN: 17 mg/dL (ref 8–27)
CO2: 25 mmol/L (ref 18–29)
CREATININE: 1.14 mg/dL (ref 0.76–1.27)
Calcium: 9.3 mg/dL (ref 8.6–10.2)
Chloride: 102 mmol/L (ref 96–106)
GFR calc Af Amer: 77 mL/min/{1.73_m2} (ref >59)
GFR, EST NON AFRICAN AMERICAN: 67 mL/min/{1.73_m2} (ref >59)
Glucose: 120 mg/dL — ABNORMAL HIGH (ref 65–99)
Potassium: 4 mmol/L (ref 3.5–5.2)
SODIUM: 143 mmol/L (ref 134–144)

## 2015-05-03 ENCOUNTER — Encounter: Payer: Self-pay | Admitting: Family Medicine

## 2015-06-22 ENCOUNTER — Other Ambulatory Visit: Payer: Self-pay | Admitting: Family Medicine

## 2015-11-04 DIAGNOSIS — M9903 Segmental and somatic dysfunction of lumbar region: Secondary | ICD-10-CM | POA: Diagnosis not present

## 2015-11-04 DIAGNOSIS — M5137 Other intervertebral disc degeneration, lumbosacral region: Secondary | ICD-10-CM | POA: Diagnosis not present

## 2015-11-04 DIAGNOSIS — M545 Low back pain: Secondary | ICD-10-CM | POA: Diagnosis not present

## 2015-11-05 DIAGNOSIS — M545 Low back pain: Secondary | ICD-10-CM | POA: Diagnosis not present

## 2015-11-05 DIAGNOSIS — M9903 Segmental and somatic dysfunction of lumbar region: Secondary | ICD-10-CM | POA: Diagnosis not present

## 2015-11-05 DIAGNOSIS — M5137 Other intervertebral disc degeneration, lumbosacral region: Secondary | ICD-10-CM | POA: Diagnosis not present

## 2016-01-11 ENCOUNTER — Other Ambulatory Visit: Payer: Self-pay | Admitting: Family Medicine

## 2016-01-21 ENCOUNTER — Other Ambulatory Visit: Payer: Self-pay | Admitting: Family Medicine

## 2016-01-23 ENCOUNTER — Other Ambulatory Visit: Payer: Self-pay | Admitting: Family Medicine

## 2016-01-24 ENCOUNTER — Other Ambulatory Visit: Payer: Self-pay | Admitting: Family Medicine

## 2016-02-14 ENCOUNTER — Other Ambulatory Visit: Payer: Self-pay | Admitting: Family Medicine

## 2016-02-15 ENCOUNTER — Other Ambulatory Visit: Payer: Self-pay | Admitting: Family Medicine

## 2016-02-22 ENCOUNTER — Other Ambulatory Visit: Payer: Self-pay | Admitting: Family Medicine

## 2016-04-03 ENCOUNTER — Other Ambulatory Visit: Payer: Self-pay | Admitting: Family Medicine

## 2016-04-04 ENCOUNTER — Other Ambulatory Visit: Payer: Self-pay | Admitting: Family Medicine

## 2016-04-04 NOTE — Telephone Encounter (Signed)
  Last routine OV: 04/30/15 Next OV: None on file.   Please note. Pt had 90 day supply filled on 01/24/16, and a 30 day supply on 02/22/16

## 2016-04-12 ENCOUNTER — Other Ambulatory Visit: Payer: Self-pay | Admitting: Family Medicine

## 2016-04-25 ENCOUNTER — Telehealth: Payer: Self-pay

## 2016-04-25 NOTE — Telephone Encounter (Signed)
Attempted to reach to schedule for Medicare Wellness. Message left for pt to return call.   

## 2016-05-09 ENCOUNTER — Other Ambulatory Visit: Payer: Self-pay | Admitting: Family Medicine

## 2016-05-09 NOTE — Telephone Encounter (Signed)
Left message for patient to call and schedule an appointment.

## 2016-05-09 NOTE — Telephone Encounter (Signed)
Needs appointment

## 2016-05-10 NOTE — Telephone Encounter (Signed)
Called and left a message for patient to return my call to schedule an office visit

## 2016-05-15 NOTE — Telephone Encounter (Signed)
Recieved a fax from the pharmacy for a refill, it was denied, will wait until patient calls to schedule a follow up appointment.

## 2016-05-16 ENCOUNTER — Ambulatory Visit (INDEPENDENT_AMBULATORY_CARE_PROVIDER_SITE_OTHER): Payer: Medicare Other | Admitting: Family Medicine

## 2016-05-16 ENCOUNTER — Encounter: Payer: Self-pay | Admitting: Family Medicine

## 2016-05-16 ENCOUNTER — Other Ambulatory Visit: Payer: Self-pay | Admitting: Family Medicine

## 2016-05-16 VITALS — BP 192/100 | HR 57 | Temp 98.1°F | Ht 72.1 in | Wt 232.8 lb

## 2016-05-16 DIAGNOSIS — Z0000 Encounter for general adult medical examination without abnormal findings: Secondary | ICD-10-CM

## 2016-05-16 DIAGNOSIS — K429 Umbilical hernia without obstruction or gangrene: Secondary | ICD-10-CM | POA: Diagnosis not present

## 2016-05-16 DIAGNOSIS — I10 Essential (primary) hypertension: Secondary | ICD-10-CM | POA: Diagnosis not present

## 2016-05-16 DIAGNOSIS — M160 Bilateral primary osteoarthritis of hip: Secondary | ICD-10-CM | POA: Diagnosis not present

## 2016-05-16 DIAGNOSIS — Z1211 Encounter for screening for malignant neoplasm of colon: Secondary | ICD-10-CM

## 2016-05-16 DIAGNOSIS — Z13220 Encounter for screening for lipoid disorders: Secondary | ICD-10-CM | POA: Diagnosis not present

## 2016-05-16 DIAGNOSIS — K469 Unspecified abdominal hernia without obstruction or gangrene: Secondary | ICD-10-CM | POA: Diagnosis not present

## 2016-05-16 DIAGNOSIS — N390 Urinary tract infection, site not specified: Secondary | ICD-10-CM | POA: Diagnosis not present

## 2016-05-16 DIAGNOSIS — Z125 Encounter for screening for malignant neoplasm of prostate: Secondary | ICD-10-CM

## 2016-05-16 MED ORDER — NAPROXEN 500 MG PO TABS: 500.0000 mg | ORAL_TABLET | Freq: Two times a day (BID) | ORAL | 6 refills | Status: DC

## 2016-05-16 MED ORDER — LISINOPRIL 10 MG PO TABS: 10.0000 mg | ORAL_TABLET | Freq: Every day | ORAL | 1 refills | Status: DC

## 2016-05-16 MED ORDER — AMLODIPINE BESYLATE 5 MG PO TABS: 10.0000 mg | ORAL_TABLET | Freq: Every day | ORAL | 1 refills | Status: DC

## 2016-05-16 NOTE — Assessment & Plan Note (Signed)
Needs BP under control to have surgery- will get BP under control and then get him back into general surgery.

## 2016-05-16 NOTE — Patient Instructions (Addendum)
Preventative Services:  Health Risk Assessment and Personalized Prevention Plan: Done today Bone Mass Measurements: N/A CVD Screening: Done today Colon Cancer Screening: Cologuard ordered today Depression Screening: Done today Diabetes Screening: Done today Glaucoma Screening: See your eye doctor Hepatitis B vaccine: N/A Hepatitis C screening: Up to date HIV Screening: up to date Flu Vaccine: Get in October Lung cancer Screening: N/A Obesity Screening: Done today Pneumonia Vaccines (2): Declined STI Screening: N/A PSA screening: Done today   Health Maintenance, Male A healthy lifestyle and preventive care is important for your health and wellness. Ask your health care provider about what schedule of regular examinations is right for you. What should I know about weight and diet?  Eat a Healthy Diet  Eat plenty of vegetables, fruits, whole grains, low-fat dairy products, and lean protein.  Do not eat a lot of foods high in solid fats, added sugars, or salt. Maintain a Healthy Weight  Regular exercise can help you achieve or maintain a healthy weight. You should:  Do at least 150 minutes of exercise each week. The exercise should increase your heart rate and make you sweat (moderate-intensity exercise).  Do strength-training exercises at least twice a week. Watch Your Levels of Cholesterol and Blood Lipids  Have your blood tested for lipids and cholesterol every 5 years starting at 68 years of age. If you are at high risk for heart disease, you should start having your blood tested when you are 68 years old. You may need to have your cholesterol levels checked more often if:  Your lipid or cholesterol levels are high.  You are older than 68 years of age.  You are at high risk for heart disease. What should I know about cancer screening? Many types of cancers can be detected early and may often be prevented. Lung Cancer  You should be screened every year for lung cancer  if:  You are a current smoker who has smoked for at least 30 years.  You are a former smoker who has quit within the past 15 years.  Talk to your health care provider about your screening options, when you should start screening, and how often you should be screened. Colorectal Cancer  Routine colorectal cancer screening usually begins at 68 years of age and should be repeated every 5-10 years until you are 68 years old. You may need to be screened more often if early forms of precancerous polyps or small growths are found. Your health care provider may recommend screening at an earlier age if you have risk factors for colon cancer.  Your health care provider may recommend using home test kits to check for hidden blood in the stool.  A small camera at the end of a tube can be used to examine your colon (sigmoidoscopy or colonoscopy). This checks for the earliest forms of colorectal cancer. Prostate and Testicular Cancer  Depending on your age and overall health, your health care provider may do certain tests to screen for prostate and testicular cancer.  Talk to your health care provider about any symptoms or concerns you have about testicular or prostate cancer. Skin Cancer  Check your skin from head to toe regularly.  Tell your health care provider about any new moles or changes in moles, especially if:  There is a change in a mole's size, shape, or color.  You have a mole that is larger than a pencil eraser.  Always use sunscreen. Apply sunscreen liberally and repeat throughout the day.  Protect  yourself by wearing long sleeves, pants, a wide-brimmed hat, and sunglasses when outside. What should I know about heart disease, diabetes, and high blood pressure?  If you are 42-80 years of age, have your blood pressure checked every 3-5 years. If you are 80 years of age or older, have your blood pressure checked every year. You should have your blood pressure measured twice-once when  you are at a hospital or clinic, and once when you are not at a hospital or clinic. Record the average of the two measurements. To check your blood pressure when you are not at a hospital or clinic, you can use:  An automated blood pressure machine at a pharmacy.  A home blood pressure monitor.  Talk to your health care provider about your target blood pressure.  If you are between 25-53 years old, ask your health care provider if you should take aspirin to prevent heart disease.  Have regular diabetes screenings by checking your fasting blood sugar level.  If you are at a normal weight and have a low risk for diabetes, have this test once every three years after the age of 2.  If you are overweight and have a high risk for diabetes, consider being tested at a younger age or more often.  A one-time screening for abdominal aortic aneurysm (AAA) by ultrasound is recommended for men aged 65-75 years who are current or former smokers. What should I know about preventing infection? Hepatitis B  If you have a higher risk for hepatitis B, you should be screened for this virus. Talk with your health care provider to find out if you are at risk for hepatitis B infection. Hepatitis C  Blood testing is recommended for:  Everyone born from [birth year] through 1967.  Anyone with known risk factors for hepatitis C. Sexually Transmitted Diseases (STDs)  You should be screened each year for STDs including gonorrhea and chlamydia if:  You are sexually active and are younger than 68 years of age.  You are older than 68 years of age and your health care provider tells you that you are at risk for this type of infection.  Your sexual activity has changed since you were last screened and you are at an increased risk for chlamydia or gonorrhea. Ask your health care provider if you are at risk.  Talk with your health care provider about whether you are at high risk of being infected with HIV. Your health care  provider may recommend a prescription medicine to help prevent HIV infection. What else can I do?  Schedule regular health, dental, and eye exams.  Stay current with your vaccines (immunizations).  Do not use any tobacco products, such as cigarettes, chewing tobacco, and e-cigarettes. If you need help quitting, ask your health care provider.  Limit alcohol intake to no more than 2 drinks per day. One drink equals 12 ounces of beer, 5 ounces of wine, or 1 ounces of hard liquor.  Do not use street drugs.  Do not share needles.  Ask your health care provider for help if you need support or information about quitting drugs.  Tell your health care provider if you often feel depressed.  Tell your health care provider if you have ever been abused or do not feel safe at home. This information is not intended to replace advice given to you by your health care provider. Make sure you discuss any questions you have with your health care provider. Document Released: 07/01/2007 Document Revised: 09/01/2015  Document Reviewed: 10/06/2014 Elsevier Interactive Patient Education  2017 [employer].

## 2016-05-16 NOTE — Assessment & Plan Note (Signed)
Under poor control. Will restart his medicine and recheck in 2 weeks. Call with any concerns.

## 2016-05-16 NOTE — Assessment & Plan Note (Signed)
Needs BP under control to have surgery- will get BP under control and then get him back into general surgery.  

## 2016-05-16 NOTE — Progress Notes (Signed)
BP (!) 192/100   Pulse (!) 57   Temp 98.1 F (36.7 C)   Ht 6' 0.1" (1.831 m)   Wt 232 lb 12.8 oz (105.6 kg)   SpO2 97%   BMI 31.49 kg/m    Subjective:    Patient ID: Kenneth Johnston, male    DOB: 10/05/1950, 66 y.o.   MRN: [id number]  HPI: Kenneth Johnston is a 66 y.o. male presenting on 05/16/2016 for comprehensive medical examination. Current medical complaints include:  HYPERTENSION- has not been taking any of his blood pressure medicine. He ran out and is not sure when.  Hypertension status: exacerbated  Satisfied with current treatment? no Duration of hypertension: chronic BP monitoring frequency:  not checking BP medication side effects:  no Medication compliance: poor compliance Previous BP meds: lisinopril, amlodipine Aspirin: no Recurrent headaches: no Visual changes: no Palpitations: no Dyspnea: no Chest pain: no Lower extremity edema: no Dizzy/lightheaded: no  Interim Problems from his last visit: no  Functional Status Survey: Is the patient deaf or have difficulty hearing?: No Does the patient have difficulty seeing, even when wearing glasses/contacts?: No Does the patient have difficulty concentrating, remembering, or making decisions?: No Does the patient have difficulty walking or climbing stairs?: No Does the patient have difficulty dressing or bathing?: No Does the patient have difficulty doing errands alone such as visiting a doctor's office or shopping?: No  FALL RISK: Fall Risk  05/16/2016 04/30/2015  Falls in the past year? Yes No  Number falls in past yr: 1 -  Injury with Fall? Yes -    Depression Screen Depression screen [facility] 2/9 05/16/2016 04/30/2015 03/18/2015  Decreased Interest 0 0 0  Down, Depressed, Hopeless 0 0 0  PHQ - 2 Score 0 0 0    Advanced Directives SEE APPROPRIATE AREA OF CHART  Past Medical History:  Past Medical History:  Diagnosis Date  . Allergy   . Arthritis   . Umbilical hernia 2017    Surgical History:  Past  Surgical History:  Procedure Laterality Date  . KNEE CARTILAGE SURGERY Bilateral     Medications:  No current outpatient prescriptions on file prior to visit.   No current facility-administered medications on file prior to visit.     Allergies:  Allergies  Allergen Reactions  . Lisinopril Other (See Comments)    Elevated creatinine    Social History:  Social History   Social History  . Marital status: Married    Spouse name: N/A  . Number of children: N/A  . Years of education: N/A   Occupational History  . Not on file.   Social History Main Topics  . Smoking status: Former Smoker    Quit date: 03/04/1995  . Smokeless tobacco: Never Used  . Alcohol use Yes     Comment: on occasion  . Drug use: No  . Sexual activity: Yes   Other Topics Concern  . Not on file   Social History Narrative  . No narrative on file   History  Smoking Status  . Former Smoker  . Quit date: 03/04/1995  Smokeless Tobacco  . Never Used   History  Alcohol Use  . Yes    Comment: on occasion    Family History:  Family History  Problem Relation Age of Onset  . Kidney failure Mother   . Aneurysm Father     brain  . Kidney disease Paternal Grandmother   . Heart attack Paternal Grandfather     Past  medical history, surgical history, medications, allergies, family history and social history reviewed with patient today and changes made to appropriate areas of the chart.   Review of Systems  Constitutional: Negative.   HENT: Negative.   Eyes: Negative.   Respiratory: Negative.   Cardiovascular: Negative.   Gastrointestinal: Negative.   Genitourinary: Negative.   Musculoskeletal: Negative.   Skin: Negative.   Neurological: Negative.   Endo/Heme/Allergies: Positive for environmental allergies. Negative for polydipsia. Does not bruise/bleed easily.  Psychiatric/Behavioral: Negative.    All other ROS negative except what is listed above and in the HPI.      Objective:    BP  (!) 192/100   Pulse (!) 57   Temp 98.1 F (36.7 C)   Ht 6' 0.1" (1.831 m)   Wt 232 lb 12.8 oz (105.6 kg)   SpO2 97%   BMI 31.49 kg/m   Wt Readings from Last 3 Encounters:  05/16/16 232 lb 12.8 oz (105.6 kg)  04/30/15 229 lb 3.2 oz (104 kg)  03/18/15 228 lb (103.4 kg)     Hearing Screening             Right ear:   Fail    Left ear:   Visual Acuity Screening   Right eye Left eye Both eyes  Without correction:  With correction:       Physical Exam  Constitutional: He is oriented to person, place, and time. He appears well-developed and well-nourished. No distress.  HENT:  Head: Normocephalic and atraumatic.  Right Ear: Hearing and external ear normal.  Left Ear: Hearing and external ear normal.  Nose: Nose normal.  Mouth/Throat: Oropharynx is clear and moist. No oropharyngeal exudate.  Eyes: Conjunctivae, EOM and lids are normal. Pupils are equal, round, and reactive to light. Right eye exhibits no discharge. Left eye exhibits no discharge. No scleral icterus.  Neck: Normal range of motion. Neck supple. No JVD present. No tracheal deviation present. No thyromegaly present.  Cardiovascular: Normal rate, regular rhythm, normal heart sounds and intact distal pulses.  Exam reveals no gallop and no friction rub.   No murmur heard. Pulmonary/Chest: Effort normal and breath sounds normal. No stridor. No respiratory distress. He has no wheezes. He has no rales. He exhibits no tenderness.  Abdominal: Soft. Bowel sounds are normal. He exhibits no distension and no mass. There is no tenderness. There is no rebound and no guarding.  Genitourinary:  Genitourinary Comments: Deferred at patient's request  Musculoskeletal: Normal range of motion. He exhibits no edema, tenderness or deformity.  Lymphadenopathy:    He has no cervical adenopathy.  Neurological: He is alert and oriented to person,  place, and time. He has normal reflexes. He displays normal reflexes. No cranial nerve deficit. He exhibits normal muscle tone. Coordination normal.  Skin: Skin is warm, dry and intact. No rash noted. He is not diaphoretic. No erythema. No pallor.  Psychiatric: He has a normal mood and affect. His speech is normal and behavior is normal. Judgment and thought content normal. Cognition and memory are normal.  Nursing note and vitals reviewed.   6CIT Screen 05/16/2016  What Year? 0 points  What month? 0 points  What time? 0 points  Count back from 20 0 points  Months in reverse 0 points  Repeat phrase 4 points  Total Score 4     Results for orders placed or performed in visit  on 04/30/15  Basic metabolic panel  Result Value Ref Range   Glucose 120 (H) 65 - 99 mg/dL   BUN 17 8 - 27 mg/dL   Creatinine, Ser 1.19 0.76 - 1.27 mg/dL   GFR calc non Af Amer 67 >59 mL/min/1.73   GFR calc Af Amer 77 >59 mL/min/1.73   BUN/Creatinine Ratio 15 10 - 24   Sodium 143 134 - 144 mmol/L   Potassium 4.0 3.5 - 5.2 mmol/L   Chloride 102 96 - 106 mmol/L   CO2 25 18 - 29 mmol/L   Calcium 9.3 8.6 - 10.2 mg/dL      Assessment & Plan:   Problem List Items Addressed This Visit      Cardiovascular and Mediastinum   HTN (hypertension)    Under poor control. Will restart his medicine and recheck in 2 weeks. Call with any concerns.       Relevant Medications   amLODipine (NORVASC) 5 MG tablet   lisinopril (PRINIVIL,ZESTRIL) 10 MG tablet   Other Relevant Orders   CBC with Differential/Platelet   Comprehensive metabolic panel   Microalbumin, Urine Waived   TSH   UA/M w/rflx Culture, Routine     Musculoskeletal and Integument   Arthritis of both hips    Will change him from meloxicam to naproxen. Call with any concerns. Recheck 2 weeks.       Relevant Medications   naproxen (NAPROSYN) 500 MG tablet     Other   Umbilical hernia    Needs BP under control to have surgery- will get BP under control  and then get him back into general surgery.       Hernia of abdominal cavity    Needs BP under control to have surgery- will get BP under control and then get him back into general surgery.        Other Visit Diagnoses    Medicare annual wellness visit, subsequent    -  Primary   Preventative care discussed today as below. Call with any concerns. Await results.    Screening for prostate cancer       Labs drawn today. Await results.    Relevant Orders   PSA   Screening for cholesterol level       Labs drawn today. Await results.    Relevant Orders   Lipid Panel w/o Chol/HDL Ratio   Screening for colon cancer       Will do cologuard. Ordered today.   Relevant Orders   Cologuard       Preventative Services:  Health Risk Assessment and Personalized Prevention Plan: Done today Bone Mass Measurements: N/A CVD Screening: Done today Colon Cancer Screening: Cologuard ordered today Depression Screening: Done today Diabetes Screening: Done today Glaucoma Screening: See your eye doctor Hepatitis B vaccine: N/A Hepatitis C screening: Up to date HIV Screening: up to date Flu Vaccine: Get in October Lung cancer Screening: N/A Obesity Screening: Done today Pneumonia Vaccines (2): Declined STI Screening: N/A PSA screening: Done today  Discussed aspirin prophylaxis for myocardial infarction prevention and decision was it was not indicated  LABORATORY TESTING:  Health maintenance labs ordered today as discussed above.   The natural history of prostate cancer and ongoing controversy regarding screening and potential treatment outcomes of prostate cancer has been discussed with the patient. The meaning of a false positive PSA and a false negative PSA has been discussed. He indicates understanding of the limitations of this screening test and wishes to proceed with screening PSA  testing.   IMMUNIZATIONS:   - Tdap: Tetanus vaccination status reviewed: Declined. - Influenza: Postponed  to flu season - Pneumovax: Refused - Prevnar: Refused - Zostavax vaccine: Will check with insurance  SCREENING: - Colonoscopy: Will do cologuard, ordered today.  Discussed with patient purpose of the colonoscopy is to detect colon cancer at curable precancerous or early stages   PATIENT COUNSELING:    Sexuality: Discussed sexually transmitted diseases, partner selection, use of condoms, avoidance of unintended pregnancy  and contraceptive alternatives.   Advised to avoid cigarette smoking.  I discussed with the patient that most people either abstain from alcohol or drink within safe limits (<=14/week and <=4 drinks/occasion for males, <=7/weeks and <= 3 drinks/occasion for females) and that the risk for alcohol disorders and other health effects rises proportionally with the number of drinks per week and how often a drinker exceeds daily limits.  Discussed cessation/primary prevention of drug use and availability of treatment for abuse.   Diet: Encouraged to adjust caloric intake to maintain  or achieve ideal body weight, to reduce intake of dietary saturated fat and total fat, to limit sodium intake by avoiding high sodium foods and not adding table salt, and to maintain adequate dietary potassium and calcium preferably from fresh fruits, vegetables, and low-fat dairy products.    stressed the importance of regular exercise  Injury prevention: Discussed safety belts, safety helmets, smoke detector, smoking near bedding or upholstery.   Dental health: Discussed importance of regular tooth brushing, flossing, and dental visits.   Follow up plan: NEXT PREVENTATIVE PHYSICAL DUE IN 1 YEAR. Return in about 2 weeks (around 05/30/2016) for BP follow up.

## 2016-05-16 NOTE — Assessment & Plan Note (Signed)
Will change him from meloxicam to naproxen. Call with any concerns. Recheck 2 weeks.

## 2016-05-16 NOTE — Addendum Note (Signed)
Addended by: [name] on: 05/16/2016 11:08 AM   Modules accepted: Orders

## 2016-05-17 ENCOUNTER — Telehealth: Payer: Self-pay | Admitting: Family Medicine

## 2016-05-17 LAB — CBC WITH DIFFERENTIAL/PLATELET
BASOS ABS: 0 10*3/uL (ref 0.0–0.2)
Basos: 1 %
EOS (ABSOLUTE): 0.1 10*3/uL (ref 0.0–0.4)
EOS: 2 %
HEMATOCRIT: 45.4 % (ref 37.5–51.0)
Hemoglobin: 15.5 g/dL (ref 13.0–17.7)
IMMATURE GRANULOCYTES: 0 %
Immature Grans (Abs): 0 10*3/uL (ref 0.0–0.1)
Lymphocytes Absolute: 1.3 10*3/uL (ref 0.7–3.1)
Lymphs: 25 %
MCH: 29.5 pg (ref 26.6–33.0)
MCHC: 34.1 g/dL (ref 31.5–35.7)
MCV: 87 fL (ref 79–97)
MONOS ABS: 0.3 10*3/uL (ref 0.1–0.9)
Monocytes: 5 %
NEUTROS PCT: 67 %
Neutrophils Absolute: 3.5 10*3/uL (ref 1.4–7.0)
PLATELETS: 236 10*3/uL (ref 150–379)
RBC: 5.25 x10E6/uL (ref 4.14–5.80)
RDW: 14.3 % (ref 12.3–15.4)
WBC: 5.1 10*3/uL (ref 3.4–10.8)

## 2016-05-17 LAB — COMPREHENSIVE METABOLIC PANEL
ALT: 14 IU/L (ref 0–44)
AST: 12 IU/L (ref 0–40)
Albumin/Globulin Ratio: 1.7 (ref 1.2–2.2)
Albumin: 4.3 g/dL (ref 3.6–4.8)
Alkaline Phosphatase: 62 IU/L (ref 39–117)
BUN/Creatinine Ratio: 18 (ref 10–24)
BUN: 22 mg/dL (ref 8–27)
Bilirubin Total: 0.3 mg/dL (ref 0.0–1.2)
CO2: 24 mmol/L (ref 18–29)
CREATININE: 1.19 mg/dL (ref 0.76–1.27)
Calcium: 9.6 mg/dL (ref 8.6–10.2)
Chloride: 109 mmol/L — ABNORMAL HIGH (ref 96–106)
GFR calc Af Amer: 73 mL/min/{1.73_m2} (ref >59)
GFR, EST NON AFRICAN AMERICAN: 63 mL/min/{1.73_m2} (ref >59)
GLUCOSE: 100 mg/dL — AB (ref 65–99)
Globulin, Total: 2.6 g/dL (ref 1.5–4.5)
Potassium: 4.6 mmol/L (ref 3.5–5.2)
Sodium: 148 mmol/L — ABNORMAL HIGH (ref 134–144)
Total Protein: 6.9 g/dL (ref 6.0–8.5)

## 2016-05-17 LAB — LIPID PANEL W/O CHOL/HDL RATIO
CHOLESTEROL TOTAL: 158 mg/dL (ref 100–199)
HDL: 31 mg/dL — AB (ref >39)
LDL CALC: 104 mg/dL — AB (ref 0–99)
Triglycerides: 114 mg/dL (ref 0–149)
VLDL CHOLESTEROL CAL: 23 mg/dL (ref 5–40)

## 2016-05-17 LAB — TSH: TSH: 3.57 u[IU]/mL (ref 0.450–4.500)

## 2016-05-17 LAB — PSA: Prostate Specific Ag, Serum: 2 ng/mL (ref 0.0–4.0)

## 2016-05-17 NOTE — Telephone Encounter (Signed)
Patient notified

## 2016-05-17 NOTE — Telephone Encounter (Signed)
Please let him know that his labs came back normal, but his urine looks like he might have an infection. We're waiting on the culture and we'll let him know when it comes back.

## 2016-05-19 ENCOUNTER — Telehealth: Payer: Self-pay | Admitting: Family Medicine

## 2016-05-19 MED ORDER — CIPROFLOXACIN HCL 500 MG PO TABS: 500.0000 mg | ORAL_TABLET | Freq: Two times a day (BID) | ORAL | 0 refills | Status: DC

## 2016-05-19 NOTE — Telephone Encounter (Signed)
Please let him know that his urine grew out bacteria, so it looks like he has an infection. I've sent him through an antibiotic to his pharmacy and I'd like him to take it please. Thanks!

## 2016-05-19 NOTE — Telephone Encounter (Signed)
Patient notified

## 2016-05-20 LAB — UA/M W/RFLX CULTURE, ROUTINE
Bilirubin, UA: NEGATIVE
Glucose, UA: NEGATIVE
Ketones, UA: NEGATIVE
Nitrite, UA: POSITIVE — AB
PH UA: 5.5 (ref 5.0–7.5)
Specific Gravity, UA: >1.03 — ABNORMAL HIGH (ref 1.005–1.030)
UUROB: 0.2 mg/dL (ref 0.2–1.0)

## 2016-05-20 LAB — MICROSCOPIC EXAMINATION: WBC, UA: >30 /hpf — AB (ref 0–?)

## 2016-05-20 LAB — MICROALBUMIN, URINE WAIVED
Creatinine, Urine Waived: 300 mg/dL (ref 10–300)
Microalb, Ur Waived: 150 mg/L — ABNORMAL HIGH (ref 0–19)

## 2016-05-20 LAB — URINE CULTURE, REFLEX

## 2016-05-22 ENCOUNTER — Telehealth: Payer: Self-pay | Admitting: Family Medicine

## 2016-05-22 MED ORDER — NITROFURANTOIN MONOHYD MACRO 100 MG PO CAPS: 100.0000 mg | ORAL_CAPSULE | Freq: Two times a day (BID) | ORAL | 0 refills | Status: DC

## 2016-05-22 NOTE — Telephone Encounter (Signed)
Patient notified

## 2016-05-22 NOTE — Telephone Encounter (Signed)
Please let him know that the bacteria he's growing out was resistant to the antibiotic, so I sent him through a different one and he should stop that one. Thanks!

## 2016-05-22 NOTE — Telephone Encounter (Signed)
Called patient, no answer, left message for patient to return my call.  

## 2016-05-30 ENCOUNTER — Ambulatory Visit (INDEPENDENT_AMBULATORY_CARE_PROVIDER_SITE_OTHER): Payer: Medicare Other | Admitting: Family Medicine

## 2016-05-30 ENCOUNTER — Encounter: Payer: Self-pay | Admitting: Family Medicine

## 2016-05-30 VITALS — BP 161/82 | HR 64 | Temp 98.4°F | Wt 234.7 lb

## 2016-05-30 DIAGNOSIS — N3001 Acute cystitis with hematuria: Secondary | ICD-10-CM

## 2016-05-30 DIAGNOSIS — I10 Essential (primary) hypertension: Secondary | ICD-10-CM

## 2016-05-30 NOTE — Assessment & Plan Note (Signed)
Better- will check labs to watch electrolytes and if creatinine doing OK will increase lisinopril to 20. Continue current regimen.

## 2016-05-30 NOTE — Progress Notes (Signed)
BP (!) 161/82 (BP Location: Left Arm, Patient Position: Sitting, Cuff Size: Large)   Pulse 64   Temp 98.4 F (36.9 C)   Wt 234 lb 11.2 oz (106.5 kg)   SpO2 97%   BMI 31.74 kg/m    Subjective:    Patient ID: Kenneth Johnston, male    DOB: Jun 10, 1950, 66 y.o.   MRN: [id number]  HPI: Kenneth Johnston is a 66 y.o. male  Chief Complaint  Patient presents with  . Hypertension   HYPERTENSION Hypertension status: better  Satisfied with current treatment? no Duration of hypertension: chronic BP monitoring frequency:  not checking BP medication side effects:  no Medication compliance: poor compliance Previous BP meds: amlodipine, lisinopril Aspirin: no Recurrent headaches: no Visual changes: no Palpitations: no Dyspnea: no Chest pain: no Lower extremity edema: no Dizzy/lightheaded: no   Relevant past medical, surgical, family and social history reviewed and updated as indicated. Interim medical history since our last visit reviewed. Allergies and medications reviewed and updated.  Review of Systems  Constitutional: Negative.   Respiratory: Negative.   Cardiovascular: Negative.   Psychiatric/Behavioral: Negative.     Per HPI unless specifically indicated above     Objective:    BP (!) 161/82 (BP Location: Left Arm, Patient Position: Sitting, Cuff Size: Large)   Pulse 64   Temp 98.4 F (36.9 C)   Wt 234 lb 11.2 oz (106.5 kg)   SpO2 97%   BMI 31.74 kg/m   Wt Readings from Last 3 Encounters:  05/30/16 234 lb 11.2 oz (106.5 kg)  05/16/16 232 lb 12.8 oz (105.6 kg)  04/30/15 229 lb 3.2 oz (104 kg)    Physical Exam  Constitutional: He is oriented to person, place, and time. He appears well-developed and well-nourished. No distress.  HENT:  Head: Normocephalic and atraumatic.  Right Ear: Hearing normal.  Left Ear: Hearing normal.  Nose: Nose normal.  Eyes: Conjunctivae and lids are normal. Right eye exhibits no discharge. Left eye exhibits no discharge. No  scleral icterus.  Cardiovascular: Normal rate, regular rhythm, normal heart sounds and intact distal pulses.  Exam reveals no gallop and no friction rub.   No murmur heard. Pulmonary/Chest: Effort normal. No respiratory distress. He has no wheezes. He has no rales. He exhibits no tenderness.  Musculoskeletal: Normal range of motion.  Neurological: He is alert and oriented to person, place, and time.  Skin: Skin is warm, dry and intact. No rash noted. No erythema. No pallor.  Psychiatric: He has a normal mood and affect. His speech is normal and behavior is normal. Judgment and thought content normal. Cognition and memory are normal.  Nursing note and vitals reviewed.   Results for orders placed or performed in visit on 05/16/16  Microscopic Examination  Result Value Ref Range   WBC, UA >30 (A) 0 - 5 /hpf   RBC, UA 3-5 (A) 0 - 2 /hpf   Epithelial Cells (non renal) 0-10 0 - 10 /hpf   Mucus, UA Present (A) Not Estab.   Bacteria, UA Few None seen/Few  CBC with Differential/Platelet  Result Value Ref Range   WBC 5.1 3.4 - 10.8 x10E3/uL   RBC 5.25 4.14 - 5.80 x10E6/uL   Hemoglobin 15.5 13.0 - 17.7 g/dL   Hematocrit 62.945.4 52.837.5 - 51.0 %   MCV 87 79 - 97 fL   MCH 29.5 26.6 - 33.0 pg   MCHC 34.1 31.5 - 35.7 g/dL   RDW 41.314.3 24.412.3 - 01.015.4 %  Platelets 236 150 - 379 x10E3/uL   Neutrophils 67 Not Estab. %   Lymphs 25 Not Estab. %   Monocytes 5 Not Estab. %   Eos 2 Not Estab. %   Basos 1 Not Estab. %   Neutrophils Absolute 3.5 1.4 - 7.0 x10E3/uL   Lymphocytes Absolute 1.3 0.7 - 3.1 x10E3/uL   Monocytes Absolute 0.3 0.1 - 0.9 x10E3/uL   EOS (ABSOLUTE) 0.1 0.0 - 0.4 x10E3/uL   Basophils Absolute 0.0 0.0 - 0.2 x10E3/uL   Immature Granulocytes 0 Not Estab. %   Immature Grans (Abs) 0.0 0.0 - 0.1 x10E3/uL  Comprehensive metabolic panel  Result Value Ref Range   Glucose 100 (H) 65 - 99 mg/dL   BUN 22 8 - 27 mg/dL   Creatinine, Ser 1.61 0.76 - 1.27 mg/dL   GFR calc non Af Amer 63 >59  mL/min/1.73   GFR calc Af Amer 73 >59 mL/min/1.73   BUN/Creatinine Ratio 18 10 - 24   Sodium 148 (H) 134 - 144 mmol/L   Potassium 4.6 3.5 - 5.2 mmol/L   Chloride 109 (H) 96 - 106 mmol/L   CO2 24 18 - 29 mmol/L   Calcium 9.6 8.6 - 10.2 mg/dL   Total Protein 6.9 6.0 - 8.5 g/dL   Albumin 4.3 3.6 - 4.8 g/dL   Globulin, Total 2.6 1.5 - 4.5 g/dL   Albumin/Globulin Ratio 1.7 1.2 - 2.2   Bilirubin Total 0.3 0.0 - 1.2 mg/dL   Alkaline Phosphatase 62 39 - 117 IU/L   AST 12 0 - 40 IU/L   ALT 14 0 - 44 IU/L  Lipid Panel w/o Chol/HDL Ratio  Result Value Ref Range   Cholesterol, Total 158 100 - 199 mg/dL   Triglycerides 096 0 - 149 mg/dL   HDL 31 (L) >04 mg/dL   VLDL Cholesterol Cal 23 5 - 40 mg/dL   LDL Calculated 540 (H) 0 - 99 mg/dL  Microalbumin, Urine Waived  Result Value Ref Range   Microalb, Ur Waived 150 (H) 0 - 19 mg/L   Creatinine, Urine Waived 300 10 - 300 mg/dL   Microalb/Creat Ratio 30-300 (H) <30 mg/g  PSA  Result Value Ref Range   Prostate Specific Ag, Serum 2.0 0.0 - 4.0 ng/mL  TSH  Result Value Ref Range   TSH 3.570 0.450 - 4.500 uIU/mL  UA/M w/rflx Culture, Routine  Result Value Ref Range   Specific Gravity, UA >1.030 (H) 1.005 - 1.030   pH, UA 5.5 5.0 - 7.5   Color, UA Yellow Yellow   Appearance Ur Turbid (A) Clear   Leukocytes, UA 1+ (A) Negative   Protein, UA 2+ (A) Negative/Trace   Glucose, UA Negative Negative   Ketones, UA Negative Negative   RBC, UA 3+ (A) Negative   Bilirubin, UA Negative Negative   Urobilinogen, Ur 0.2 0.2 - 1.0 mg/dL   Nitrite, UA Positive (A) Negative   Microscopic Examination See below:    Urinalysis Reflex Comment   Urine Culture, Routine  Result Value Ref Range   Urine Culture, Routine Final report (A)    Urine Culture result 1 Comment (A)    ANTIMICROBIAL SUSCEPTIBILITY Comment       Assessment & Plan:   Problem List Items Addressed This Visit      Cardiovascular and Mediastinum   HTN (hypertension) - Primary     Better- will check labs to watch electrolytes and if creatinine doing OK will increase lisinopril to 20. Continue current regimen.  Relevant Orders   Basic metabolic panel    Other Visit Diagnoses    Acute cystitis with hematuria       Rechecking UA today- await results.    Relevant Orders   UA/M w/rflx Culture, Routine       Follow up plan: Return 2-3 weeks, for Recheck BP.

## 2016-05-31 ENCOUNTER — Telehealth: Payer: Self-pay | Admitting: Family Medicine

## 2016-05-31 LAB — BASIC METABOLIC PANEL WITH GFR
BUN/Creatinine Ratio: 13 (ref 10–24)
BUN: 13 mg/dL (ref 8–27)
CO2: 24 mmol/L (ref 18–29)
Calcium: 9.4 mg/dL (ref 8.6–10.2)
Chloride: 101 mmol/L (ref 96–106)
Creatinine, Ser: 1.04 mg/dL (ref 0.76–1.27)
GFR calc Af Amer: 85 mL/min/1.73
GFR calc non Af Amer: 74 mL/min/1.73
Glucose: 122 mg/dL — ABNORMAL HIGH (ref 65–99)
Potassium: 4 mmol/L (ref 3.5–5.2)
Sodium: 140 mmol/L (ref 134–144)

## 2016-05-31 MED ORDER — LISINOPRIL 20 MG PO TABS: 20.0000 mg | ORAL_TABLET | Freq: Every day | ORAL | 3 refills | Status: DC

## 2016-05-31 MED ORDER — LISINOPRIL 20 MG PO TABS: 10.0000 mg | ORAL_TABLET | Freq: Every day | ORAL | 3 refills | Status: DC

## 2016-05-31 NOTE — Telephone Encounter (Signed)
Kenneth Johnston, can you please let him know that his labs were normal and have him increase his lisinopril to 2 pills a day, I'll send through the 20mg  and I'll see him in 3-4 weeks.

## 2016-05-31 NOTE — Telephone Encounter (Signed)
Called and let patient know what Dr. Laural BenesJohnson said labs and medication. Will route back to Dr. [facility]Johnson [facility]

## 2016-06-02 LAB — UA/M W/RFLX CULTURE, ROUTINE
BILIRUBIN UA: NEGATIVE
GLUCOSE, UA: NEGATIVE
KETONES UA: NEGATIVE
Nitrite, UA: NEGATIVE
PROTEIN UA: NEGATIVE
Specific Gravity, UA: 1.015 (ref 1.005–1.030)
Urobilinogen, Ur: 0.2 mg/dL (ref 0.2–1.0)
pH, UA: 6 (ref 5.0–7.5)

## 2016-06-02 LAB — URINE CULTURE, REFLEX

## 2016-06-02 LAB — MICROSCOPIC EXAMINATION: Bacteria, UA: NONE SEEN

## 2016-06-13 ENCOUNTER — Ambulatory Visit (INDEPENDENT_AMBULATORY_CARE_PROVIDER_SITE_OTHER): Payer: Medicare Other | Admitting: Family Medicine

## 2016-06-13 ENCOUNTER — Encounter: Payer: Self-pay | Admitting: Family Medicine

## 2016-06-13 VITALS — BP 156/78 | HR 64 | Temp 98.4°F | Wt 232.8 lb

## 2016-06-13 DIAGNOSIS — M65352 Trigger finger, left little finger: Secondary | ICD-10-CM | POA: Diagnosis not present

## 2016-06-13 DIAGNOSIS — I10 Essential (primary) hypertension: Secondary | ICD-10-CM | POA: Diagnosis not present

## 2016-06-13 MED ORDER — AMLODIPINE BESYLATE 5 MG PO TABS: 10.0000 mg | ORAL_TABLET | Freq: Every day | ORAL | 1 refills | Status: DC

## 2016-06-13 MED ORDER — LISINOPRIL 10 MG PO TABS: 10.0000 mg | ORAL_TABLET | Freq: Two times a day (BID) | ORAL | 1 refills | Status: DC

## 2016-06-13 NOTE — Assessment & Plan Note (Signed)
Referral to hand surgeon made today.

## 2016-06-13 NOTE — Progress Notes (Signed)
BP (!) 156/78   Pulse 64   Temp 98.4 F (36.9 C)   Wt 232 lb 12.8 oz (105.6 kg)   SpO2 96%   BMI 31.49 kg/m    Subjective:    Patient ID: Kenneth Johnston, male    DOB: 1950-06-08, 66 y.o.   MRN: [id number]  HPI: Kenneth Johnston is a 66 y.o. male  Chief Complaint  Patient presents with  . Hypertension    Patient states that he is not taking Amlodipine, and that when he takes the 20mg  Lisinopril he gets a headache about 10 minutes later, he states that this did not happen when he was taking 10mg  BID  . Dizziness    Patient states that he was in the attic working last week, when he came down he was light headed   HYPERTENSION- starts noticing that he gets a headache about 10 minutes after he takes the medicine, didn't happen with 10mg  BID, has not been taking the amlodipine Hypertension status: stable  Satisfied with current treatment? 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 He would like to do something about this.   Relevant past medical, surgical, family and social history reviewed and updated as indicated. Interim medical history since our last visit reviewed. Allergies and medications reviewed and updated.  Review of Systems  Constitutional: Negative.   Respiratory: Negative.   Cardiovascular: Negative.   Musculoskeletal: Positive for arthralgias. Negative for back pain, gait problem, joint swelling, myalgias, neck pain and neck stiffness.  Psychiatric/Behavioral: Negative.     Per HPI unless specifically indicated above       Objective:    BP (!) 156/78   Pulse 64   Temp 98.4 F (36.9 C)   Wt 232 lb 12.8 oz (105.6 kg)   SpO2 96%   BMI 31.49 kg/m   Wt Readings from Last 3 Encounters:  06/13/16 232 lb 12.8 oz (105.6 kg)  05/30/16 234 lb 11.2 oz (106.5 kg)  05/16/16 232 lb 12.8 oz (105.6 kg)    Physical Exam  Constitutional: He is oriented to person, place, and time. He appears well-developed and well-nourished. No distress.  HENT:  Head: Normocephalic and atraumatic.  Right Ear: Hearing normal.  Left Ear: Hearing normal.  Nose: Nose normal.  Eyes: Conjunctivae and lids are normal. Right eye exhibits no discharge. Left eye exhibits no discharge. No scleral icterus.  Cardiovascular: Normal rate, regular rhythm, normal heart sounds and intact distal pulses.  Exam reveals no gallop and no friction rub.   No murmur heard. Pulmonary/Chest: Effort normal and breath sounds normal. No respiratory distress. He has no wheezes. He has no rales. He exhibits no tenderness.  Musculoskeletal: Normal range of motion.  Neurological: He is alert and oriented to person, place, and time.  Skin: Skin is warm, dry and intact. No rash noted. He is not diaphoretic. No erythema. No pallor.  Psychiatric: He has a normal mood and affect. His speech is normal and behavior is normal. Judgment and thought content normal. Cognition and memory are normal.  Nursing note and vitals reviewed.   Results  for orders placed or performed in visit on 05/30/16  Microscopic Examination  Result Value Ref Range   WBC, UA 0-5 0 - 5 /hpf   RBC, UA 0-2 0 - 2 /hpf   Epithelial Cells (non renal) 0-10 0 - 10 /hpf   Bacteria, UA None seen None seen/Few  Basic metabolic panel  Result Value Ref Range   Glucose 122 (H) 65 - 99 mg/dL   BUN 13 8 - 27 mg/dL   Creatinine, Ser 1.61 0.76 - 1.27 mg/dL   GFR calc non Af Amer 74 >59 mL/min/1.73   GFR calc Af Amer 85 >59 mL/min/1.73   BUN/Creatinine Ratio 13 10 - 24   Sodium 140 134 - 144 mmol/L    Potassium 4.0 3.5 - 5.2 mmol/L   Chloride 101 96 - 106 mmol/L   CO2 24 18 - 29 mmol/L   Calcium 9.4 8.6 - 10.2 mg/dL  UA/M w/rflx Culture, Routine  Result Value Ref Range   Specific Gravity, UA 1.015 1.005 - 1.030   pH, UA 6.0 5.0 - 7.5   Color, UA Yellow Yellow   Appearance Ur Clear Clear   Leukocytes, UA 1+ (A) Negative   Protein, UA Negative Negative/Trace   Glucose, UA Negative Negative   Ketones, UA Negative Negative   RBC, UA Trace (A) Negative   Bilirubin, UA Negative Negative   Urobilinogen, Ur 0.2 0.2 - 1.0 mg/dL   Nitrite, UA Negative Negative   Microscopic Examination See below:    Urinalysis Reflex Comment   Urine Culture, Routine  Result Value Ref Range   Urine Culture, Routine Final report    Urine Culture result 1 Comment       Assessment & Plan:   Problem List Items Addressed This Visit      Cardiovascular and Mediastinum   HTN (hypertension) - Primary    Not under good control feels better on 10mg  lisinopril BID- restart that, add amlodipine. Recheck 3 weeks, Checking BMP today.      Relevant Medications   lisinopril (PRINIVIL,ZESTRIL) 10 MG tablet   amLODipine (NORVASC) 5 MG tablet   Other Relevant Orders   Basic metabolic panel     Musculoskeletal and Integument   Trigger little finger of left hand    Referral to hand surgeon made today.      Relevant Orders   Ambulatory referral to Hand Surgery       Follow up plan: Return 3 weeks, for Follow up BP with BMP.

## 2016-06-13 NOTE — Patient Instructions (Addendum)
Trigger Finger Trigger finger (stenosing tenosynovitis) is a condition that causes a finger to get stuck in a bent position. Each finger has a tough, cord-like tissue that connects muscle to bone (tendon), and each tendon is surrounded by a tunnel of tissue (tendon sheath). To move your finger, your tendon needs to slide freely through the sheath. Trigger finger happens when the tendon or the sheath thickens, making it difficult to move your finger. Trigger finger can affect any finger or a thumb. It may affect more than one finger. Mild cases may clear up with rest and medicine. Severe cases require more treatment. What are the causes? Trigger finger is caused by a thickened finger tendon or tendon sheath. The cause of this thickening is not known. What increases the risk? The following factors may make you more likely to develop this condition:  Doing activities that require a strong grip.  Having rheumatoid arthritis, gout, or diabetes.  Being 40-60 years old.  Being a woman.  What are the signs or symptoms? Symptoms of this condition include:  Pain when bending or straightening your finger.  Tenderness or swelling where your finger attaches to the palm of your hand.  A lump in the palm of your hand or on the inside of your finger.  Hearing a popping sound when you try to straighten your finger.  Feeling a popping, catching, or locking sensation when you try to straighten your finger.  Being unable to straighten your finger.  How is this diagnosed? This condition is diagnosed based on your symptoms and a physical exam. How is this treated? This condition may be treated by:  Resting your finger and avoiding activities that make symptoms worse.  Wearing a finger splint to keep your finger in a slightly bent position.  Taking NSAIDs to relieve pain and swelling.  Injecting medicine (steroids) into the tendon sheath to reduce swelling and irritation. Injections may need to be  repeated.  Having surgery to open the tendon sheath. This may be done if other treatments do not work and you cannot straighten your finger. You may need physical therapy after surgery.  Follow these instructions at home:  Use moist heat to help reduce pain and swelling as told by your health care provider.  Rest your finger and avoid activities that make pain worse. Return to normal activities as told by your health care provider.  If you have a splint, wear it as told by your health care provider.  Take over-the-counter and prescription medicines only as told by your health care provider.  Keep all follow-up visits as told by your health care provider. This is important. Contact a health care provider if:  Your symptoms are not improving with home care. Summary  Trigger finger (stenosing tenosynovitis) causes your finger to get stuck in a bent position, and it can make it difficult and painful to straighten your finger.  This condition develops when a finger tendon or tendon sheath thickens.  Treatment starts with resting, wearing a splint, and taking NSAIDs.  In severe cases, surgery to open the tendon sheath may be needed. This information is not intended to replace advice given to you by your health care provider. Make sure you discuss any questions you have with your health care provider. Document Released: 10/23/2003 Document Revised: 12/14/2015 Document Reviewed: 12/14/2015 Elsevier Interactive Patient Education  2017 [employer]  

## 2016-06-13 NOTE — Assessment & Plan Note (Signed)
Not under good control feels better on 10mg  lisinopril BID- restart that, add amlodipine. Recheck 3 weeks, Checking BMP today.

## 2016-06-14 ENCOUNTER — Encounter: Payer: Self-pay | Admitting: Family Medicine

## 2016-06-14 LAB — BASIC METABOLIC PANEL
BUN / CREAT RATIO: 12 (ref 10–24)
BUN: 15 mg/dL (ref 8–27)
CO2: 21 mmol/L (ref 18–29)
CREATININE: 1.26 mg/dL (ref 0.76–1.27)
Calcium: 9.2 mg/dL (ref 8.6–10.2)
Chloride: 104 mmol/L (ref 96–106)
GFR, EST AFRICAN AMERICAN: 68 mL/min/{1.73_m2} (ref >59)
GFR, EST NON AFRICAN AMERICAN: 59 mL/min/{1.73_m2} — AB (ref >59)
Glucose: 112 mg/dL — ABNORMAL HIGH (ref 65–99)
POTASSIUM: 4.1 mmol/L (ref 3.5–5.2)
SODIUM: 141 mmol/L (ref 134–144)

## 2016-06-19 DIAGNOSIS — M720 Palmar fascial fibromatosis [Dupuytren]: Secondary | ICD-10-CM | POA: Insufficient documentation

## 2016-06-19 DIAGNOSIS — G5620 Lesion of ulnar nerve, unspecified upper limb: Secondary | ICD-10-CM | POA: Insufficient documentation

## 2016-06-19 DIAGNOSIS — G5622 Lesion of ulnar nerve, left upper limb: Secondary | ICD-10-CM | POA: Diagnosis not present

## 2016-07-03 ENCOUNTER — Other Ambulatory Visit: Payer: Self-pay | Admitting: Family Medicine

## 2016-07-03 DIAGNOSIS — Z1212 Encounter for screening for malignant neoplasm of rectum: Secondary | ICD-10-CM | POA: Diagnosis not present

## 2016-07-03 DIAGNOSIS — Z1211 Encounter for screening for malignant neoplasm of colon: Secondary | ICD-10-CM | POA: Diagnosis not present

## 2016-07-03 LAB — COLOGUARD: Cologuard: NEGATIVE

## 2016-07-05 ENCOUNTER — Encounter: Payer: Self-pay | Admitting: Family Medicine

## 2016-07-05 ENCOUNTER — Ambulatory Visit (INDEPENDENT_AMBULATORY_CARE_PROVIDER_SITE_OTHER): Payer: Medicare Other | Admitting: Family Medicine

## 2016-07-05 VITALS — BP 136/70 | HR 61 | Temp 97.7°F | Wt 239.1 lb

## 2016-07-05 DIAGNOSIS — G8929 Other chronic pain: Secondary | ICD-10-CM | POA: Diagnosis not present

## 2016-07-05 DIAGNOSIS — M542 Cervicalgia: Secondary | ICD-10-CM

## 2016-07-05 DIAGNOSIS — M545 Low back pain: Secondary | ICD-10-CM | POA: Diagnosis not present

## 2016-07-05 DIAGNOSIS — I10 Essential (primary) hypertension: Secondary | ICD-10-CM | POA: Diagnosis not present

## 2016-07-05 MED ORDER — AMLODIPINE BESYLATE 5 MG PO TABS: 5.0000 mg | ORAL_TABLET | Freq: Every day | ORAL | 1 refills | Status: DC

## 2016-07-05 MED ORDER — LISINOPRIL 20 MG PO TABS: 20.0000 mg | ORAL_TABLET | Freq: Every day | ORAL | 1 refills | Status: DC

## 2016-07-05 MED ORDER — MELOXICAM 15 MG PO TABS: 15.0000 mg | ORAL_TABLET | Freq: Every day | ORAL | 3 refills | Status: DC

## 2016-07-05 NOTE — Assessment & Plan Note (Signed)
Under good control on recheck. Continue current regimen. Continue to monitor. Call if going up. Recheck 6 months.

## 2016-07-05 NOTE — Progress Notes (Signed)
BP 136/70   Pulse 61   Temp 97.7 F (36.5 C)   Wt 239 lb 1 oz (108.4 kg)   SpO2 97%   BMI 32.33 kg/m    Subjective:    Patient ID: Kenneth Johnston, male    DOB: 04-18-1950, 66 y.o.   MRN: [id number]  HPI: Kenneth Johnston is a 66 y.o. male  Chief Complaint  Patient presents with  . Hypertension   HYPERTENSION Hypertension status: better  Satisfied with current treatment? yes Duration of hypertension: chronic BP monitoring frequency:  daily BP range: 140s at home BP medication side effects:  no Medication compliance: excellent compliance Previous BP meds: lisinopril, amlodipine Aspirin: no Recurrent headaches: no Visual changes: no Palpitations: no Dyspnea: no Chest pain: no Lower extremity edema: no Dizzy/lightheaded: no  Low back and neck pain. Has had surgery in the past, would like to see [facility] for evaluation for the "laser"  Relevant past medical, surgical, family and social history reviewed and updated as indicated. Interim medical history since our last visit reviewed. Allergies and medications reviewed and updated.  Review of Systems  Constitutional: Negative.   Respiratory: Negative.   Cardiovascular: Negative.   Musculoskeletal: Positive for back pain, myalgias, neck pain and neck stiffness. Negative for arthralgias, gait problem and joint swelling.  Psychiatric/Behavioral: Negative.     Per HPI unless specifically indicated above     Objective:    BP 136/70   Pulse 61   Temp 97.7 F (36.5 C)   Wt 239 lb 1 oz (108.4 kg)   SpO2 97%   BMI 32.33 kg/m   Wt Readings from Last 3 Encounters:  07/05/16 239 lb 1 oz (108.4 kg)  06/13/16 232 lb 12.8 oz (105.6 kg)  05/30/16 234 lb 11.2 oz (106.5 kg)    Physical Exam  Constitutional: He is oriented to person, place, and time. He appears well-developed and well-nourished. No distress.  HENT:  Head: Normocephalic and atraumatic.  Right Ear: Hearing normal.  Left Ear: Hearing normal.  Nose: Nose  normal.  Eyes: Conjunctivae and lids are normal. Right eye exhibits no discharge. Left eye exhibits no discharge. No scleral icterus.  Cardiovascular: Normal rate, regular rhythm, normal heart sounds and intact distal pulses.  Exam reveals no gallop and no friction rub.   No murmur heard. Pulmonary/Chest: Effort normal and breath sounds normal. No respiratory distress. He has no wheezes. He has no rales. He exhibits no tenderness.  Musculoskeletal: Normal range of motion.  Neurological: He is alert and oriented to person, place, and time.  Skin: Skin is warm, dry and intact. No rash noted. He is not diaphoretic. No erythema. No pallor.  Psychiatric: He has a normal mood and affect. His speech is normal and behavior is normal. Judgment and thought content normal. Cognition and memory are normal.  Nursing note and vitals reviewed.   Results for orders placed or performed in visit on 06/13/16  Basic metabolic panel  Result Value Ref Range   Glucose 112 (H) 65 - 99 mg/dL   BUN 15 8 - 27 mg/dL   Creatinine, Ser 6.441.26 0.76 - 1.27 mg/dL   GFR calc non Af Amer 59 (L) >59 mL/min/1.73   GFR calc Af Amer 68 >59 mL/min/1.73   BUN/Creatinine Ratio 12 10 - 24   Sodium 141 134 - 144 mmol/L   Potassium 4.1 3.5 - 5.2 mmol/L   Chloride 104 96 - 106 mmol/L   CO2 21 18 - 29 mmol/L  Calcium 9.2 8.6 - 10.2 mg/dL      Assessment & Plan:   Problem List Items Addressed This Visit      Cardiovascular and Mediastinum   HTN (hypertension) - Primary    Under good control on recheck. Continue current regimen. Continue to monitor. Call if going up. Recheck 6 months.       Relevant Medications   lisinopril (PRINIVIL,ZESTRIL) 20 MG tablet   amLODipine (NORVASC) 5 MG tablet   Other Relevant Orders   Basic metabolic panel    Other Visit Diagnoses    Chronic neck pain       Hx of surgery- would like to see [facility] for evaluation for the "laser surgery". Referral generated today.   Relevant Medications    GABAPENTIN PO   meloxicam (MOBIC) 15 MG tablet   Other Relevant Orders   Ambulatory referral to Neurosurgery   Chronic bilateral low back pain without sciatica       Hx of surgery- would like to see [facility] for evaluation for the "laser surgery". Referral generated today.   Relevant Medications   meloxicam (MOBIC) 15 MG tablet   Other Relevant Orders   Ambulatory referral to Neurosurgery       Follow up plan: Return in about 6 months (around 01/04/2017) for Follow up BP.

## 2016-07-06 LAB — BASIC METABOLIC PANEL
BUN / CREAT RATIO: 18 (ref 10–24)
BUN: 27 mg/dL (ref 8–27)
CHLORIDE: 105 mmol/L (ref 96–106)
CO2: 24 mmol/L (ref 20–29)
CREATININE: 1.53 mg/dL — AB (ref 0.76–1.27)
Calcium: 9.5 mg/dL (ref 8.6–10.2)
GFR calc Af Amer: 54 mL/min/{1.73_m2} — ABNORMAL LOW (ref >59)
GFR calc non Af Amer: 46 mL/min/{1.73_m2} — ABNORMAL LOW (ref >59)
GLUCOSE: 99 mg/dL (ref 65–99)
Potassium: 4.7 mmol/L (ref 3.5–5.2)
SODIUM: 143 mmol/L (ref 134–144)

## 2016-07-07 ENCOUNTER — Telehealth: Payer: Self-pay | Admitting: Family Medicine

## 2016-07-07 DIAGNOSIS — N289 Disorder of kidney and ureter, unspecified: Secondary | ICD-10-CM

## 2016-07-07 NOTE — Telephone Encounter (Signed)
Pt.notified

## 2016-07-07 NOTE — Telephone Encounter (Signed)
Called and left message for patient to return my call.

## 2016-07-07 NOTE — Telephone Encounter (Signed)
Please let Kenneth Johnston know that his kidney function went up a little bit again- this may be due to the heat, so I just want him to stop back for a blood test in 3 weeks and drink LOTS AND LOTS of water. Thanks!

## 2016-07-10 ENCOUNTER — Encounter: Payer: Self-pay | Admitting: Family Medicine

## 2016-07-18 ENCOUNTER — Other Ambulatory Visit: Payer: Self-pay | Admitting: Family Medicine

## 2016-08-29 DIAGNOSIS — M542 Cervicalgia: Secondary | ICD-10-CM | POA: Diagnosis not present

## 2016-08-29 DIAGNOSIS — M545 Low back pain: Secondary | ICD-10-CM | POA: Diagnosis not present

## 2016-08-29 DIAGNOSIS — G8929 Other chronic pain: Secondary | ICD-10-CM | POA: Diagnosis not present

## 2016-08-29 DIAGNOSIS — M48062 Spinal stenosis, lumbar region with neurogenic claudication: Secondary | ICD-10-CM | POA: Diagnosis not present

## 2016-09-01 ENCOUNTER — Other Ambulatory Visit: Payer: Self-pay | Admitting: Student

## 2016-09-01 DIAGNOSIS — G9519 Other vascular myelopathies: Secondary | ICD-10-CM

## 2016-09-01 DIAGNOSIS — M48062 Spinal stenosis, lumbar region with neurogenic claudication: Secondary | ICD-10-CM

## 2016-09-13 ENCOUNTER — Ambulatory Visit: Payer: Medicare Other

## 2016-09-20 ENCOUNTER — Ambulatory Visit: Payer: Medicare Other

## 2016-09-20 DIAGNOSIS — N289 Disorder of kidney and ureter, unspecified: Secondary | ICD-10-CM

## 2016-09-21 LAB — BASIC METABOLIC PANEL WITH GFR
BUN/Creatinine Ratio: 16 (ref 10–24)
BUN: 22 mg/dL (ref 8–27)
CO2: 21 mmol/L (ref 20–29)
Calcium: 9.3 mg/dL (ref 8.6–10.2)
Chloride: 103 mmol/L (ref 96–106)
Creatinine, Ser: 1.39 mg/dL — ABNORMAL HIGH (ref 0.76–1.27)
GFR calc Af Amer: 60 mL/min/1.73
GFR calc non Af Amer: 52 mL/min/1.73 — ABNORMAL LOW
Glucose: 108 mg/dL — ABNORMAL HIGH (ref 65–99)
Potassium: 4.3 mmol/L (ref 3.5–5.2)
Sodium: 143 mmol/L (ref 134–144)

## 2016-09-22 ENCOUNTER — Telehealth: Payer: Self-pay | Admitting: Family Medicine

## 2016-09-22 MED ORDER — LISINOPRIL 10 MG PO TABS: 10.0000 mg | ORAL_TABLET | Freq: Every day | ORAL | 3 refills | Status: DC

## 2016-09-22 MED ORDER — AMLODIPINE BESYLATE 10 MG PO TABS: 10.0000 mg | ORAL_TABLET | Freq: Every day | ORAL | 3 refills | Status: DC

## 2016-09-22 NOTE — Telephone Encounter (Signed)
Called and discussed results with Maisie Fushomas. Creatinine still elevated. Will cut down his lisinopril to 10mg  daily and increase his amlodipine to 10mg  daily. Will get him back in about a month to recheck his BP and labs.   Rosey Batheresa, please get him scheduled for an appointment about 2-4 weeks from now. Thanks!

## 2016-09-25 NOTE — Telephone Encounter (Signed)
Patient made appt for 10/09/16 in the A.M.

## 2016-10-09 ENCOUNTER — Ambulatory Visit (INDEPENDENT_AMBULATORY_CARE_PROVIDER_SITE_OTHER): Payer: Medicare Other | Admitting: Family Medicine

## 2016-10-09 ENCOUNTER — Encounter: Payer: Self-pay | Admitting: Family Medicine

## 2016-10-09 VITALS — BP 136/74 | HR 70 | Temp 98.2°F | Wt 241.1 lb

## 2016-10-09 DIAGNOSIS — I10 Essential (primary) hypertension: Secondary | ICD-10-CM

## 2016-10-09 DIAGNOSIS — G8929 Other chronic pain: Secondary | ICD-10-CM

## 2016-10-09 DIAGNOSIS — M5442 Lumbago with sciatica, left side: Secondary | ICD-10-CM

## 2016-10-09 DIAGNOSIS — N289 Disorder of kidney and ureter, unspecified: Secondary | ICD-10-CM | POA: Diagnosis not present

## 2016-10-09 MED ORDER — CYCLOBENZAPRINE HCL 10 MG PO TABS: 10.0000 mg | ORAL_TABLET | Freq: Every day | ORAL | 0 refills | Status: DC

## 2016-10-09 NOTE — Progress Notes (Signed)
BP 136/74   Pulse 70   Temp 98.2 F (36.8 C)   Wt 241 lb 1 oz (109.3 kg)   SpO2 97%   BMI 32.60 kg/m    Subjective:    Patient ID: Kenneth Johnston, male    DOB: 1950/10/23, 66 y.o.   MRN: [id number]  HPI: Kenneth Johnston is a 66 y.o. male  Chief Complaint  Patient presents with  . Hypertension   HYPERTENSION Hypertension status: stable  Satisfied with current treatment? yes Duration of hypertension: chronic BP monitoring frequency:  a few times a week BP range: 120s/80s BP medication side effects:  no Medication compliance: excellent compliance Previous BP meds: lisinopril, amlodipine Aspirin: no Recurrent headaches: no Visual changes: no Palpitations: no Dyspnea: no Chest pain: no Lower extremity edema: no Dizzy/lightheaded: no  BACK PAIN- has some bad osteophytes. Saw ortho and they didn't want to do surgery- recommended PT and considering laser Duration: chronic Mechanism of injury: unknown Location: bilateral and low back Onset: gradual Severity: severe Quality: dull and aching Frequency: constant Radiation: L leg above the knee Aggravating factors: lifting, movement, walking and bending Alleviating factors: rest, ice, heat, laying, NSAIDs and APAP Status: worse Treatments attempted: chiropractor, rest, ice, heat, APAP, ibuprofen and aleve  Relief with NSAIDs?: mild Nighttime pain:  no Paresthesias / decreased sensation:  no Bowel / bladder incontinence:  no Fevers:  no Dysuria / urinary frequency:  no  Relevant past medical, surgical, family and social history reviewed and updated as indicated. Interim medical history since our last visit reviewed. Allergies and medications reviewed and updated.  Review of Systems  Constitutional: Positive for diaphoresis. Negative for activity change, appetite change, chills, fatigue, fever and unexpected weight change.  Respiratory: Positive for shortness of breath. Negative for apnea, cough, choking, chest  tightness, wheezing and stridor.   Cardiovascular: Negative.   Musculoskeletal: Positive for arthralgias and back pain. Negative for gait problem, joint swelling, myalgias, neck pain and neck stiffness.  Psychiatric/Behavioral: Negative.     Per HPI unless specifically indicated above     Objective:    BP 136/74   Pulse 70   Temp 98.2 F (36.8 C)   Wt 241 lb 1 oz (109.3 kg)   SpO2 97%   BMI 32.60 kg/m   Wt Readings from Last 3 Encounters:  10/09/16 241 lb 1 oz (109.3 kg)  07/05/16 239 lb 1 oz (108.4 kg)  06/13/16 232 lb 12.8 oz (105.6 kg)    Physical Exam  Constitutional: He is oriented to person, place, and time. He appears well-developed and well-nourished. No distress.  HENT:  Head: Normocephalic and atraumatic.  Right Ear: Hearing normal.  Left Ear: Hearing normal.  Nose: Nose normal.  Eyes: Conjunctivae and lids are normal. Right eye exhibits no discharge. Left eye exhibits no discharge. No scleral icterus.  Cardiovascular: Normal rate, regular rhythm, normal heart sounds and intact distal pulses.  Exam reveals no gallop and no friction rub.   No murmur heard. Pulmonary/Chest: Effort normal and breath sounds normal. No respiratory distress. He has no wheezes. He has no rales. He exhibits no tenderness.  Neurological: He is alert and oriented to person, place, and time.  Skin: Skin is warm, dry and intact. No rash noted. He is not diaphoretic. No erythema. No pallor.  Psychiatric: He has a normal mood and affect. His speech is normal and behavior is normal. Judgment and thought content normal. Cognition and memory are normal.  Nursing note and vitals reviewed. Back  Exam:    Inspection:  Normal spinal curvature.  No deformity, ecchymosis, erythema, or lesions     Palpation:     Midline spinal tenderness: no      Paralumbar tenderness: yes Right     Parathoracic tenderness: no      Buttocks tenderness: no     Range of Motion:      Flexion: Fingers to Knees       Extension:Decreased     Lateral bending:Decreased    Rotation:Decreased    Neuro Exam:Lower extremity DTRs normal & symmetric.  Strength and sensation intact.    Special Tests:      Straight leg raise:negative  Results for orders placed or performed in visit on 09/20/16  Basic metabolic panel  Result Value Ref Range   Glucose 108 (H) 65 - 99 mg/dL   BUN 22 8 - 27 mg/dL   Creatinine, Ser 6.04 (H) 0.76 - 1.27 mg/dL   GFR calc non Af Amer 52 (L) >59 mL/min/1.73   GFR calc Af Amer 60 >59 mL/min/1.73   BUN/Creatinine Ratio 16 10 - 24   Sodium 143 134 - 144 mmol/L   Potassium 4.3 3.5 - 5.2 mmol/L   Chloride 103 96 - 106 mmol/L   CO2 21 20 - 29 mmol/L   Calcium 9.3 8.6 - 10.2 mg/dL      Assessment & Plan:   Problem List Items Addressed This Visit      Cardiovascular and Mediastinum   HTN (hypertension) - Primary    Under good control on recheck. Awaiting BMP. Call with any concerns.       Relevant Orders   Basic metabolic panel    Other Visit Diagnoses    Chronic bilateral low back pain with left-sided sciatica       With muscle spasm today. Referral to ortho made. Start flexeril. Consider PT. See chiropractry. Call with any concerns.    Relevant Medications   gabapentin (NEURONTIN) 400 MG capsule   cyclobenzaprine (FLEXERIL) 10 MG tablet   Other Relevant Orders   Ambulatory referral to Orthopedic Surgery   Abnormal kidney function       Rechecking BMP today- if creatinine still up, will stop lisinopril. Await results.    Relevant Orders   Basic metabolic panel       Follow up plan: Return in about 6 months (around 04/08/2017) for follow up BP.

## 2016-10-09 NOTE — Assessment & Plan Note (Signed)
Under good control on recheck. Awaiting BMP. Call with any concerns.

## 2016-10-10 ENCOUNTER — Telehealth: Payer: Self-pay | Admitting: Family Medicine

## 2016-10-10 DIAGNOSIS — M5137 Other intervertebral disc degeneration, lumbosacral region: Secondary | ICD-10-CM | POA: Diagnosis not present

## 2016-10-10 DIAGNOSIS — M545 Low back pain: Secondary | ICD-10-CM | POA: Diagnosis not present

## 2016-10-10 DIAGNOSIS — M9903 Segmental and somatic dysfunction of lumbar region: Secondary | ICD-10-CM | POA: Diagnosis not present

## 2016-10-10 LAB — BASIC METABOLIC PANEL
BUN / CREAT RATIO: 14 (ref 10–24)
BUN: 18 mg/dL (ref 8–27)
CO2: 21 mmol/L (ref 20–29)
CREATININE: 1.25 mg/dL (ref 0.76–1.27)
Calcium: 9.6 mg/dL (ref 8.6–10.2)
Chloride: 106 mmol/L (ref 96–106)
GFR, EST AFRICAN AMERICAN: 68 mL/min/{1.73_m2} (ref >59)
GFR, EST NON AFRICAN AMERICAN: 59 mL/min/{1.73_m2} — AB (ref >59)
Glucose: 125 mg/dL — ABNORMAL HIGH (ref 65–99)
Potassium: 4 mmol/L (ref 3.5–5.2)
Sodium: 143 mmol/L (ref 134–144)

## 2016-10-10 NOTE — Telephone Encounter (Signed)
Patient notified

## 2016-10-10 NOTE — Telephone Encounter (Signed)
Please let him know that his kidney function is better- so we'll keep him on his same regimen and see him in 6 months. Thanks!

## 2016-10-11 DIAGNOSIS — M5137 Other intervertebral disc degeneration, lumbosacral region: Secondary | ICD-10-CM | POA: Diagnosis not present

## 2016-10-11 DIAGNOSIS — M545 Low back pain: Secondary | ICD-10-CM | POA: Diagnosis not present

## 2016-10-11 DIAGNOSIS — M9903 Segmental and somatic dysfunction of lumbar region: Secondary | ICD-10-CM | POA: Diagnosis not present

## 2016-10-12 DIAGNOSIS — M9903 Segmental and somatic dysfunction of lumbar region: Secondary | ICD-10-CM | POA: Diagnosis not present

## 2016-10-12 DIAGNOSIS — M5137 Other intervertebral disc degeneration, lumbosacral region: Secondary | ICD-10-CM | POA: Diagnosis not present

## 2016-10-12 DIAGNOSIS — M545 Low back pain: Secondary | ICD-10-CM | POA: Diagnosis not present

## 2016-10-18 ENCOUNTER — Telehealth: Payer: Self-pay | Admitting: Family Medicine

## 2016-10-18 DIAGNOSIS — R0609 Other forms of dyspnea: Principal | ICD-10-CM

## 2016-10-18 NOTE — Telephone Encounter (Signed)
Patient getting out of breat quickly and sweating more profusely in the sun. Patient would like to get a stress test scheduled.  Please Advise.  Thank you

## 2016-10-19 NOTE — Telephone Encounter (Signed)
Routing to provider  

## 2016-10-19 NOTE — Telephone Encounter (Signed)
Kenneth Johnston- just put in this referral, it's not urgent, but if we could do it sooner rather than later that'd be awesome. Thanks!

## 2016-10-20 NOTE — Telephone Encounter (Signed)
Patient scheduled for October 18

## 2016-11-01 NOTE — Progress Notes (Signed)
Cardiology Office Note  Date:  11/02/2016   ID:  Kenneth Johnston, DOB October 04, 1950, MRN [id number]  PCP:  [name], DO   Chief Complaint  Patient presents with  . other    C/o sob, unusual sweating, headaches and bilateral arm pain . Meds reviewed verbally with pt.    HPI:  Kenneth Johnston is a 66 year old gentleman with past medical history of Smoking pipes, stopped 20 years ago Obesity Hypertension Chronic  back pain Who presents by referral from Dr. Laural Benes for evaluation of his shortness of breath on exertion  He reports that he is worked in Pharmacist, hospital for many years, Holiday representative Leads a sedentary lifestyle, not working as much Often will take 6 or 8 weeks off from work, it does not do much No regular exercise program  Reports that over the summer he had persistence of shortness of breath Often associated with working in the heat One episode was working in a attic for several hours at a time, got short of breath when bringing all the tools down the stairs and putting him in his truck Had to sit in the truck air conditioning  Another job was walking back and forth to his truck in the heat, had shortness of breath had to sit in the truck air conditioning again to recover  Symptoms of shortness of breath on exertion but notable over the past 6 months At rest he denies any symptoms Chronic back and neck discomfort  Reports having significant sweating when working in the heat  Denies any diabetes No cholesterol problems, most recent cholesterol 150 Reports blood pressure at home is typically well controlled Recently 132/74 1 of the blood pressure in the 140 range  EKG personally reviewed by myself on todays visit Shows normal sinus rhythm with rate 75 bpm APCs no significant ST or T wave changes   PMH:   has a past medical history of Allergy; Arthritis; Hypertension; and Umbilical hernia (2017).  PSH:    Past Surgical History:  Procedure  Laterality Date  . KNEE CARTILAGE SURGERY Bilateral     Current Outpatient Prescriptions  Medication Sig Dispense Refill  . amLODipine (NORVASC) 10 MG tablet Take 1 tablet (10 mg total) by mouth daily. 30 tablet 3  . cyclobenzaprine (FLEXERIL) 10 MG tablet Take 1 tablet (10 mg total) by mouth at bedtime. 30 tablet 0  . gabapentin (NEURONTIN) 400 MG capsule TK ONE C PO TID    . lisinopril (PRINIVIL,ZESTRIL) 10 MG tablet Take 1 tablet (10 mg total) by mouth daily. 30 tablet 3  . meloxicam (MOBIC) 15 MG tablet Take 1 tablet (15 mg total) by mouth daily. 90 tablet 3   No current facility-administered medications for this visit.      Allergies:   Patient has no known allergies.   Social History:  The patient  reports that he quit smoking about 21 years ago. He has never used smokeless tobacco. He reports that he drinks alcohol. He reports that he does not use drugs.   Family History:   family history includes Aneurysm in his father; Heart attack in his paternal grandfather; Kidney disease in his paternal grandmother; Kidney failure in his mother.    Review of Systems: Review of Systems  Constitutional: Negative.   Respiratory: Positive for shortness of breath.   Cardiovascular: Negative.   Gastrointestinal: Negative.   Musculoskeletal: Negative.   Neurological: Negative.   Psychiatric/Behavioral: Negative.   All other systems reviewed and are negative.  PHYSICAL EXAM: VS:  BP (!) 152/78 (BP Location: Right Arm, Patient Position: Sitting, Cuff Size: Large)   Pulse 75   Ht 6\' 1"  (1.854 m)   Wt 244 lb 4 oz (110.8 kg)   BMI 32.22 kg/m  , BMI Body mass index is 32.22 kg/m. GEN: Well nourished, well developed, in no acute distress  HEENT: normal  Neck: no JVD, carotid bruits, or masses Cardiac: RRR; no murmurs, rubs, or gallops,no edema  Respiratory:  clear to auscultation bilaterally, normal work of breathing GI: soft, nontender, nondistended, + BS MS: no deformity or  atrophy  Skin: warm and dry, no rash Neuro:  Strength and sensation are intact Psych: euthymic mood, full affect    Recent Labs: 05/16/2016: ALT 14; Hemoglobin 15.5; Platelets 236; TSH 3.570 10/09/2016: BUN 18; Creatinine, Ser 1.25; Potassium 4.0; Sodium 143    Lipid Panel Lab Results  Component Value Date   CHOL 158 05/16/2016   HDL 31 (L) 05/16/2016   LDLCALC 104 (H) 05/16/2016   TRIG 114 05/16/2016      Wt Readings from Last 3 Encounters:  11/02/16 244 lb 4 oz (110.8 kg)  10/09/16 241 lb 1 oz (109.3 kg)  07/05/16 239 lb 1 oz (108.4 kg)       ASSESSMENT AND PLAN:  Shortness of breath - Plan: EKG 12-Lead, CT CARDIAC SCORING Suspect secondary to deconditioning and working in the extreme heat for his job A few risk factors for coronary disease, cholesterol is low, no diabetes, remote smoking of a pipe.  Denies any family history  .  Discussed various treatment options including types of stress testing with nuclear and echo stress.  Also discussed other screening options such as CT coronary calcium scoring He is willing to proceed with CT coronary calcium scoring for risk stratification If score is very low, less likely to have underlying ischemia If score is markedly elevated, may warrant further workup with stress testing Recommended a regular walking program for conditioning  Essential hypertension Blood pressure elevated on today's visit , recommended he monitor blood pressure at home and call us if numbers continue to run high  no changes made to the medications.  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 We have [place] [facility] and made an appointment for consideration of surgery  Chronic neck pain Chronic neck and lower back pain Previously seen by orthopedics We have provided him with names of [place]surgeons in [place] for second opinion  History of smoking Remote history of smoking a pipe,  stopped 20 years ago  Disposition:   F/U as needed We will call him with the results of his CT coronary calcium scoring   Total encounter time more than 60 minutes  Greater than 50% was spent in counseling and coordination of care with the patient    Orders Placed This Encounter  Procedures  . CT CARDIAC SCORING  . EKG 12-Lead     Signed, Dossie Arbourim Gollan, M.D., Ph.D. 11/02/2016  [facility] [place] [phone]

## 2016-11-02 ENCOUNTER — Ambulatory Visit (INDEPENDENT_AMBULATORY_CARE_PROVIDER_SITE_OTHER): Payer: Medicare Other | Admitting: Cardiovascular Disease

## 2016-11-02 ENCOUNTER — Encounter: Payer: Self-pay | Admitting: Cardiovascular Disease

## 2016-11-02 VITALS — BP 152/78 | HR 75 | Ht 73.0 in | Wt 244.2 lb

## 2016-11-02 DIAGNOSIS — M65352 Trigger finger, left little finger: Secondary | ICD-10-CM | POA: Diagnosis not present

## 2016-11-02 DIAGNOSIS — Z87891 Personal history of nicotine dependence: Secondary | ICD-10-CM

## 2016-11-02 DIAGNOSIS — R0602 Shortness of breath: Secondary | ICD-10-CM | POA: Diagnosis not present

## 2016-11-02 DIAGNOSIS — M542 Cervicalgia: Secondary | ICD-10-CM

## 2016-11-02 DIAGNOSIS — G8929 Other chronic pain: Secondary | ICD-10-CM | POA: Diagnosis not present

## 2016-11-02 DIAGNOSIS — I10 Essential (primary) hypertension: Secondary | ICD-10-CM

## 2016-11-02 NOTE — Patient Instructions (Addendum)
[facility] [phone]  Monday November 5th at 2:30 PM  Referral for Dupuytren's Contracture Dr. Melvyn Novasrtmann  Medication Instructions:   No medication changes made  Labwork:  No new labs needed  Testing/Procedures:  We will order a CT coronary calcium score for shortness of breath Cost $150.00 Call [phone]   Follow-Up: It was a pleasure seeing you in the office today. Please call us if you have new issues that need to be addressed before your next appt.  [phone]  Your physician wants you to follow-up in: We will call you with the results  If you need a refill on your cardiac medications before your next appointment, please call your pharmacy.

## 2016-11-20 DIAGNOSIS — M720 Palmar fascial fibromatosis [Dupuytren]: Secondary | ICD-10-CM | POA: Diagnosis not present

## 2016-12-05 DIAGNOSIS — M9903 Segmental and somatic dysfunction of lumbar region: Secondary | ICD-10-CM | POA: Diagnosis not present

## 2016-12-05 DIAGNOSIS — M720 Palmar fascial fibromatosis [Dupuytren]: Secondary | ICD-10-CM | POA: Diagnosis not present

## 2016-12-05 DIAGNOSIS — M545 Low back pain: Secondary | ICD-10-CM | POA: Diagnosis not present

## 2016-12-05 DIAGNOSIS — M5137 Other intervertebral disc degeneration, lumbosacral region: Secondary | ICD-10-CM | POA: Diagnosis not present

## 2017-01-04 ENCOUNTER — Ambulatory Visit: Payer: Medicare Other | Admitting: Family Medicine

## 2017-01-27 ENCOUNTER — Other Ambulatory Visit: Payer: Self-pay | Admitting: Family Medicine

## 2017-02-06 ENCOUNTER — Ambulatory Visit (INDEPENDENT_AMBULATORY_CARE_PROVIDER_SITE_OTHER)
Admission: RE | Admit: 2017-02-06 | Discharge: 2017-02-06 | Disposition: A | Discharge status: Home / Self Care | Payer: Self-pay | Source: Ambulatory Visit | Attending: Cardiovascular Disease | Admitting: Cardiovascular Disease

## 2017-02-06 DIAGNOSIS — R0602 Shortness of breath: Secondary | ICD-10-CM

## 2017-02-13 ENCOUNTER — Other Ambulatory Visit: Payer: Self-pay

## 2017-02-13 DIAGNOSIS — Z9189 Other specified personal risk factors, not elsewhere classified: Secondary | ICD-10-CM

## 2017-02-14 ENCOUNTER — Telehealth: Payer: Self-pay | Admitting: *Deleted

## 2017-02-14 DIAGNOSIS — R931 Abnormal findings on diagnostic imaging of heart and coronary circulation: Secondary | ICD-10-CM

## 2017-02-14 MED ORDER — ROSUVASTATIN CALCIUM 5 MG PO TABS: 5.0000 mg | ORAL_TABLET | Freq: Every day | ORAL | 3 refills | Status: DC

## 2017-02-14 NOTE — Telephone Encounter (Signed)
-----   Message from Antonieta Ibaimothy J Gollan, MD sent at 02/12/2017  6:44 PM EST ----- Heavy coronary calcium noted Would recommend stress testing, Myoview Consider scheduling this week given heavy distal left main calcification Okay to schedule at any time he is available this week

## 2017-02-14 NOTE — Telephone Encounter (Signed)
Reviewed results and recommendations with patient and he verbalized understanding. Scheduled him for stress test to be done on Tuesday 02/20/17 at 08:00AM with arrival time of 07:45AM [facility]. Reviewed all instructions for his stress testing and he verbalized understanding. Also reviewed medication Crestor 5 mg once daily and sent that in to his pharmacy of choice. He had no further questions at this time and advised to give us a call back if needed.

## 2017-02-20 ENCOUNTER — Encounter
Admission: RE | Admit: 2017-02-20 | Discharge: 2017-02-20 | Disposition: A | Discharge status: Home / Self Care | Payer: Medicare Other | Source: Ambulatory Visit | Attending: Cardiovascular Disease | Admitting: Cardiovascular Disease

## 2017-02-20 DIAGNOSIS — R931 Abnormal findings on diagnostic imaging of heart and coronary circulation: Secondary | ICD-10-CM

## 2017-02-20 DIAGNOSIS — Z9189 Other specified personal risk factors, not elsewhere classified: Secondary | ICD-10-CM | POA: Insufficient documentation

## 2017-02-20 DIAGNOSIS — R9439 Abnormal result of other cardiovascular function study: Secondary | ICD-10-CM | POA: Insufficient documentation

## 2017-02-20 LAB — NM MYOCAR MULTI W/SPECT W/WALL MOTION / EF
CHL CUP MPHR: 152 {beats}/min
CHL CUP RESTING HR STRESS: 72 {beats}/min
CSEPHR: 58 %
CSEPPHR: 89 {beats}/min
Estimated workload: 1 METS
Exercise duration (min): 0 min
Exercise duration (sec): 0 s
LVDIAVOL: 54 mL (ref 62–150)
LVSYSVOL: 19 mL
NUC STRESS TID: 0.68

## 2017-02-20 MED ORDER — TECHNETIUM TC 99M TETROFOSMIN IV KIT
30.7640 | PACK | Freq: Once | INTRAVENOUS | Status: AC | PRN
  Administered 2017-02-20: 30.764 via INTRAVENOUS

## 2017-02-20 MED ORDER — TECHNETIUM TC 99M TETROFOSMIN IV KIT
12.5600 | PACK | Freq: Once | INTRAVENOUS | Status: AC | PRN
  Administered 2017-02-20: 12.56 via INTRAVENOUS

## 2017-02-20 MED ORDER — REGADENOSON 0.4 MG/5ML IV SOLN
0.4000 mg | Freq: Once | INTRAVENOUS | Status: AC
  Administered 2017-02-20: 0.4 mg via INTRAVENOUS
  Filled 2017-02-20: qty 5

## 2017-02-23 ENCOUNTER — Encounter: Payer: Self-pay | Admitting: Family Medicine

## 2017-03-09 ENCOUNTER — Telehealth: Payer: Self-pay

## 2017-03-09 NOTE — Telephone Encounter (Signed)
Called to schedule medicare annual wellness visit with N[facility]ronek[facility]LP[facility]. Can be scheduled anytime after 05/17/2017

## 2017-04-04 ENCOUNTER — Other Ambulatory Visit: Payer: Self-pay | Admitting: Family Medicine

## 2017-04-05 ENCOUNTER — Other Ambulatory Visit: Payer: Self-pay | Admitting: Family Medicine

## 2017-04-09 ENCOUNTER — Ambulatory Visit: Payer: Medicare Other | Admitting: Family Medicine

## 2017-04-11 ENCOUNTER — Ambulatory Visit: Payer: Medicare Other | Admitting: Family Medicine

## 2017-04-17 ENCOUNTER — Encounter: Payer: Self-pay | Admitting: Family Medicine

## 2017-04-17 ENCOUNTER — Ambulatory Visit: Payer: Medicare Other | Admitting: Family Medicine

## 2017-04-17 VITALS — BP 129/77 | HR 67 | Temp 97.6°F | Wt 247.1 lb

## 2017-04-17 DIAGNOSIS — E782 Mixed hyperlipidemia: Secondary | ICD-10-CM | POA: Diagnosis not present

## 2017-04-17 DIAGNOSIS — M47812 Spondylosis without myelopathy or radiculopathy, cervical region: Secondary | ICD-10-CM | POA: Insufficient documentation

## 2017-04-17 DIAGNOSIS — I10 Essential (primary) hypertension: Secondary | ICD-10-CM

## 2017-04-17 DIAGNOSIS — E785 Hyperlipidemia, unspecified: Secondary | ICD-10-CM | POA: Insufficient documentation

## 2017-04-17 MED ORDER — AMLODIPINE BESYLATE 10 MG PO TABS
ORAL_TABLET | ORAL | 3 refills | Status: DC
Start: 2017-04-17 — End: 2018-06-21

## 2017-04-17 MED ORDER — CYCLOBENZAPRINE HCL 10 MG PO TABS: ORAL_TABLET | ORAL | 6 refills | Status: DC

## 2017-04-17 MED ORDER — ROSUVASTATIN CALCIUM 5 MG PO TABS: 5.0000 mg | ORAL_TABLET | Freq: Every day | ORAL | 3 refills | Status: DC

## 2017-04-17 MED ORDER — LISINOPRIL 10 MG PO TABS: ORAL_TABLET | ORAL | 3 refills | Status: DC

## 2017-04-17 MED ORDER — MELOXICAM 15 MG PO TABS: 15.0000 mg | ORAL_TABLET | Freq: Every day | ORAL | 3 refills | Status: DC

## 2017-04-17 NOTE — Assessment & Plan Note (Signed)
S/P fusion. Surgeon recommended him looking into laser surgery. We will refer to neurosurgery in [place]- referral made today. Continue PRN flexeril and mobic. Call with any concerns.

## 2017-04-17 NOTE — Assessment & Plan Note (Signed)
Under good control. Continue current regimen. Continue to monitor. Call with any concerns. Checking labs today. Await results. Refills given today.

## 2017-04-17 NOTE — Assessment & Plan Note (Signed)
Under good control. Continue current regimen. Continue to monitor. Call with any concerns. Checking labs today. Await results. Refills given today. 

## 2017-04-17 NOTE — Progress Notes (Signed)
BP 129/77 (BP Location: Left Arm, Patient Position: Sitting, Cuff Size: Large)   Pulse 67   Temp 97.6 F (36.4 C) (Oral)   Wt 247 lb 1 oz (112.1 kg)   SpO2 99%   BMI 32.60 kg/m    Subjective:    Patient ID: Kenneth Regalhomas A Diveley, male    DOB: [birth year], 67 y.o.   MRN: [id number]  HPI: Kenneth Johnston is a 67 y.o. male  Chief Complaint  Patient presents with  . Follow-up    blood pressure   HYPERTENSION / HYPERLIPIDEMIA Satisfied with current treatment? yes Duration of hypertension: chronic BP monitoring frequency: not checking BP medication side effects: no Past BP meds: lisinopril, amlodipine Duration of hyperlipidemia: chronic Cholesterol medication side effects: no Cholesterol supplements: none Past cholesterol medications: crestor Medication compliance: excellent compliance Aspirin: no Recent stressors: no Recurrent headaches: no Visual changes: no Palpitations: no Dyspnea: no Chest pain: no Lower extremity edema: no Dizzy/lightheaded: no  Continues with pain in his neck and upper back. Just started back on your flexeril and it's helping. Mobic keeps him moving, but doesn't take the pain away.  Has known djd in his neck. They recommended to look into laser surgery for his neck.   Relevant past medical, surgical, family and social history reviewed and updated as indicated. Interim medical history since our last visit reviewed. Allergies and medications reviewed and updated.  Review of Systems  Constitutional: Negative.   Respiratory: Negative.   Cardiovascular: Negative.   Musculoskeletal: Positive for arthralgias, myalgias, neck pain and neck stiffness. Negative for back pain, gait problem and joint swelling.  Skin: Negative.   Neurological: Negative.   Psychiatric/Behavioral: Negative.     Per HPI unless specifically indicated above     Objective:    BP 129/77 (BP Location: Left Arm, Patient Position: Sitting, Cuff Size: Large)   Pulse 67   Temp  97.6 F (36.4 C) (Oral)   Wt 247 lb 1 oz (112.1 kg)   SpO2 99%   BMI 32.60 kg/m   Wt Readings from Last 3 Encounters:  04/17/17 247 lb 1 oz (112.1 kg)  11/02/16 244 lb 4 oz (110.8 kg)  10/09/16 241 lb 1 oz (109.3 kg)    Physical Exam  Constitutional: He is oriented to person, place, and time. He appears well-developed and well-nourished. No distress.  HENT:  Head: Normocephalic and atraumatic.  Right Ear: Hearing normal.  Left Ear: Hearing normal.  Nose: Nose normal.  Eyes: Conjunctivae and lids are normal. Right eye exhibits no discharge. Left eye exhibits no discharge. No scleral icterus.  Cardiovascular: Normal rate, regular rhythm, normal heart sounds and intact distal pulses. Exam reveals no gallop and no friction rub.  No murmur heard. Pulmonary/Chest: Effort normal and breath sounds normal. No respiratory distress. He has no wheezes. He has no rales. He exhibits no tenderness.  Musculoskeletal: Normal range of motion.  Neurological: He is alert and oriented to person, place, and time.  Skin: Skin is warm, dry and intact. No rash noted. He is not diaphoretic. No erythema. No pallor.  Psychiatric: He has a normal mood and affect. His speech is normal and behavior is normal. Judgment and thought content normal. Cognition and memory are normal.  Nursing note and vitals reviewed.   Results for orders placed or performed during the hospital encounter of 02/20/17  NM Myocar Multi W/Spect W/Wall Motion / EF  Result Value Ref Range   Rest HR 72 bpm   Rest BP 156/70 mmHg  Exercise duration (sec) 0 sec   Percent HR 58 %   Exercise duration (min) 0 min   Estimated workload 1.0 METS   Peak HR 89 bpm   Peak BP 125/63 mmHg   MPHR 152 bpm   TID 0.68    LV sys vol 19 mL   LV dias vol 54 62 - 150 mL      Assessment & Plan:   Problem List Items Addressed This Visit      Cardiovascular and Mediastinum   HTN (hypertension) - Primary    Under good control. Continue current  regimen. Continue to monitor. Call with any concerns. Checking labs today. Await results. Refills given today.      Relevant Medications   amLODipine (NORVASC) 10 MG tablet   lisinopril (PRINIVIL,ZESTRIL) 10 MG tablet   rosuvastatin (CRESTOR) 5 MG tablet   Other Relevant Orders   Comprehensive metabolic panel     Musculoskeletal and Integument   DJD (degenerative joint disease) of cervical spine    S/P fusion. Surgeon recommended him looking into laser surgery. We will refer to neurosurgery in [place]- referral made today. Continue PRN flexeril and mobic. Call with any concerns.       Relevant Medications   meloxicam (MOBIC) 15 MG tablet   cyclobenzaprine (FLEXERIL) 10 MG tablet   Other Relevant Orders   Ambulatory referral to Neurosurgery     Other   Hyperlipidemia    Under good control. Continue current regimen. Continue to monitor. Call with any concerns. Checking labs today. Await results. Refills given today.      Relevant Medications   amLODipine (NORVASC) 10 MG tablet   lisinopril (PRINIVIL,ZESTRIL) 10 MG tablet   rosuvastatin (CRESTOR) 5 MG tablet   Other Relevant Orders   Comprehensive metabolic panel   Lipid Panel w/o Chol/HDL Ratio       Follow up plan: Return in about 6 months (around 10/17/2017) for Physical.

## 2017-04-18 ENCOUNTER — Other Ambulatory Visit: Payer: Self-pay | Admitting: Family Medicine

## 2017-04-18 ENCOUNTER — Encounter: Payer: Self-pay | Admitting: Family Medicine

## 2017-04-18 DIAGNOSIS — N289 Disorder of kidney and ureter, unspecified: Secondary | ICD-10-CM

## 2017-04-18 LAB — COMPREHENSIVE METABOLIC PANEL
A/G RATIO: 1.5 (ref 1.2–2.2)
ALK PHOS: 55 IU/L (ref 39–117)
ALT: 16 IU/L (ref 0–44)
AST: 14 IU/L (ref 0–40)
Albumin: 4.1 g/dL (ref 3.6–4.8)
BILIRUBIN TOTAL: 0.5 mg/dL (ref 0.0–1.2)
BUN/Creatinine Ratio: 11 (ref 10–24)
BUN: 17 mg/dL (ref 8–27)
CHLORIDE: 103 mmol/L (ref 96–106)
CO2: 23 mmol/L (ref 20–29)
Calcium: 9.3 mg/dL (ref 8.6–10.2)
Creatinine, Ser: 1.48 mg/dL — ABNORMAL HIGH (ref 0.76–1.27)
GFR calc Af Amer: 55 mL/min/{1.73_m2} — ABNORMAL LOW (ref >59)
GFR, EST NON AFRICAN AMERICAN: 48 mL/min/{1.73_m2} — AB (ref >59)
GLOBULIN, TOTAL: 2.8 g/dL (ref 1.5–4.5)
Glucose: 77 mg/dL (ref 65–99)
POTASSIUM: 4.2 mmol/L (ref 3.5–5.2)
SODIUM: 145 mmol/L — AB (ref 134–144)
Total Protein: 6.9 g/dL (ref 6.0–8.5)

## 2017-04-18 LAB — LIPID PANEL W/O CHOL/HDL RATIO
Cholesterol, Total: 117 mg/dL (ref 100–199)
HDL: 31 mg/dL — AB (ref >39)
LDL Calculated: 51 mg/dL (ref 0–99)
TRIGLYCERIDES: 174 mg/dL — AB (ref 0–149)
VLDL Cholesterol Cal: 35 mg/dL (ref 5–40)

## 2017-05-09 ENCOUNTER — Other Ambulatory Visit: Payer: Self-pay | Admitting: Family Medicine

## 2017-05-17 ENCOUNTER — Ambulatory Visit (INDEPENDENT_AMBULATORY_CARE_PROVIDER_SITE_OTHER): Payer: Medicare Other

## 2017-05-17 VITALS — BP 120/70 | HR 58 | Temp 98.1°F | Resp 16 | Ht 72.0 in | Wt 247.4 lb

## 2017-05-17 DIAGNOSIS — Z0000 Encounter for general adult medical examination without abnormal findings: Secondary | ICD-10-CM | POA: Diagnosis not present

## 2017-05-17 NOTE — Progress Notes (Signed)
Subjective:   Kenneth Johnston is a 67 y.o. male who presents for Medicare Annual/Subsequent preventive examination.  Review of Systems:   Cardiac Risk Factors include: hypertension;male gender;advanced age (>4men, >58 women);dyslipidemia;obesity (BMI >30kg/m2)     Objective:    Vitals: BP 120/70 (BP Location: Left Arm, Patient Position: Sitting)   Pulse (!) 58   Temp 98.1 F (36.7 C) (Temporal)   Resp 16   Ht 6' (1.829 m)   Wt 247 lb 6.4 oz (112.2 kg)   BMI 33.55 kg/m   Body mass index is 33.55 kg/m.  Advanced Directives 05/17/2017 05/16/2016  Does Patient Have a Medical Advance Directive? Yes No  Type of Advance Directive Living will;Healthcare Power of Attorney -  Copy of Healthcare Power of Attorney in Chart? No - copy requested -  Would patient like information on creating a medical advance directive? - Yes (MAU/Ambulatory/Procedural Areas - Information given)    Tobacco Social History   Tobacco Use  Smoking Status Former Smoker  . Last attempt to quit: 03/04/1995  . Years since quitting: 22.2  Smokeless Tobacco Never Used     Counseling given: Not Answered   Clinical Intake:  Pre-visit preparation completed: Yes  Pain : No/denies pain        How often do you need to have someone help you when you read instructions, pamphlets, or other written materials from your doctor or pharmacy?: 1 - Never What is the last grade level you completed in school?: 1 year college   Interpreter Needed?: No  Information entered by :: Tiffany HIll,LPN   Past Medical History:  Diagnosis Date  . Allergy   . Arthritis   . Hypertension   . Umbilical hernia 2017   Past Surgical History:  Procedure Laterality Date  . KNEE CARTILAGE SURGERY Bilateral    Family History  Problem Relation Age of Onset  . Kidney failure Mother   . Aneurysm Father        brain  . Kidney disease Paternal Grandmother   . Heart attack Paternal Grandfather    Social History    Socioeconomic History  . Marital status: Married    Spouse name: Not on file  . Number of children: Not on file  . Years of education: Not on file  . Highest education level: Not on file  Occupational History  . Not on file  Social Needs  . Financial resource strain: Not hard at all  . Food insecurity:    Worry: Never true    Inability: Never true  . Transportation needs:    Medical: No    Non-medical: No  Tobacco Use  . Smoking status: Former Smoker    Last attempt to quit: 03/04/1995    Years since quitting: 22.2  . Smokeless tobacco: Never Used  Substance and Sexual Activity  . Alcohol use: Yes    Comment: very seldom   . Drug use: No  . Sexual activity: Yes  Lifestyle  . Physical activity:    Days per week: 0 days    Minutes per session: 0 min  . Stress: Not at all  Relationships  . Social connections:    Talks on phone: More than three times a week    Gets together: More than three times a week    Attends religious service: More than 4 times per year    Active member of club or organization: No    Attends meetings of clubs or organizations: Never  Relationship status: Married  Other Topics Concern  . Not on file  Social History Narrative   [employer]     Outpatient Encounter Medications as of 05/17/2017  Medication Sig  . amLODipine (NORVASC) 10 MG tablet TAKE 1 TABLET(10 MG) BY MOUTH DAILY  . cyclobenzaprine (FLEXERIL) 10 MG tablet TAKE 1 TABLET(10 MG) BY MOUTH AT BEDTIME  . gabapentin (NEURONTIN) 400 MG capsule TK ONE C PO TID  . lisinopril (PRINIVIL,ZESTRIL) 10 MG tablet TAKE 1 TABLET(10 MG) BY MOUTH DAILY  . meloxicam (MOBIC) 15 MG tablet Take 1 tablet (15 mg total) by mouth daily.  . rosuvastatin (CRESTOR) 5 MG tablet Take 1 tablet (5 mg total) by mouth daily.  . [DISCONTINUED] cyclobenzaprine (FLEXERIL) 10 MG tablet TAKE 1 TABLET(10 MG) BY MOUTH AT BEDTIME (Patient not taking: Reported on 05/17/2017)   No facility-administered encounter medications on  file as of 05/17/2017.     Activities of Daily Living In your present state of health, do you have any difficulty performing the following activities: 05/17/2017  Hearing? N  Vision? N  Difficulty concentrating or making decisions? N  Walking or climbing stairs? N  Dressing or bathing? N  Doing errands, shopping? N  Preparing Food and eating ? N  Using the Toilet? N  In the past six months, have you accidently leaked urine? N  Do you have problems with loss of bowel control? N  Managing your Medications? N  Managing your Finances? N  Housekeeping or managing your Housekeeping? N  Some recent data might be hidden    Patient Care Team: [name], DO as PCP - General (Family Medicine)   Assessment:   This is a routine wellness examination for [facility].  Exercise Activities and Dietary recommendations Current Exercise Habits: The patient does not participate in regular exercise at present, Exercise limited by: None identified  Goals    . DIET - INCREASE WATER INTAKE     Recommend drinking at least 6-8 glasses of water a day        Fall Risk Fall Risk  05/17/2017 05/16/2016 04/30/2015  Falls in the past year? No Yes No  Number falls in past yr: - 1 -  Injury with Fall? - Yes -   Is the patient's home free of loose throw rugs in walkways, pet beds, electrical cords, etc?   yes      Grab bars in the bathroom? no      Handrails on the stairs?   yes      Adequate lighting?   yes  Timed Get Up and Go Performed:  Completed in 8 seconds with no use of assistive devices, steady gait. No intervention needed at this time.   Depression Screen PHQ 2/9 Scores 05/17/2017 05/16/2016 04/30/2015 03/18/2015  PHQ - 2 Score 0 0 0 0    Cognitive Function     6CIT Screen 05/17/2017 05/16/2016  What Year? 0 points 0 points  What month? 0 points 0 points  What time? 0 points 0 points  Count back from 20 0 points 0 points  Months in reverse 0 points 0 points  Repeat phrase 0 points 4 points  Total  Score 0 4     There is no immunization history on file for this patient.  Qualifies for Shingles Vaccine? Yes, discussed shingrix vaccine   Screening Tests Health Maintenance  Topic Date Due  . TETANUS/TDAP  04/18/2018 (Originally 12/29/1969)  . PNA vac Low Risk Adult (1 of 2 - PCV13) 04/18/2018 (  Originally 12/28/2013)  . INFLUENZA VACCINE  08/16/2017  . COLONOSCOPY  06/03/2026  . Hepatitis C Screening  Completed   Cancer Screenings: Lung: Low Dose CT Chest recommended if Age 59-80 years, 30 pack-year currently smoking OR have quit w/in 15years. Patient does not qualify. Colorectal: completed 06/02/2016  Additional Screenings:  Hepatitis C Screening: completed 03/04/2015      Plan:    I have personally reviewed and addressed the Medicare Annual Wellness questionnaire and have noted the following in the patient's chart:  A. Medical and social history B. Use of alcohol, tobacco or illicit drugs  C. Current medications and supplements D. Functional ability and status E.  Nutritional status F.  Physical activity G. Advance directives H. List of other physicians I.  Hospitalizations, surgeries, and ER visits in previous 12 months J.  Vitals K. Screenings such as hearing and vision if needed, cognitive and depression L. Referrals and appointments   In addition, I have reviewed and discussed with patient certain preventive protocols, quality metrics, and best practice recommendations. A written personalized care plan for preventive services as well as general preventive health recommendations were provided to patient.   Signed,  Marin Roberts, LPN Nurse Health Advisor   Nurse Notes:none

## 2017-05-17 NOTE — Patient Instructions (Signed)
Kenneth Johnston , Thank you for taking time to come for your Medicare Wellness Visit. I appreciate your ongoing commitment to your health goals. Please review the following plan we discussed and let me know if I can assist you in the future.   Screening recommendations/referrals: Colonoscopy: completed 06/02/2016 Recommended yearly ophthalmology/optometry visit for glaucoma screening and checkup Recommended yearly dental visit for hygiene and checkup  Vaccinations: Influenza vaccine: due 09/2017 Pneumococcal vaccine: declined  Tdap vaccine: due, check with your insurance company for coverage  Shingles vaccine: shingrix eligible, check with your insurance company for coverage   Advanced directives: Please bring a copy of your health care power of attorney and living will to the office at your convenience.  Conditions/risks identified: recommend drinking at least 6-8 glasses of water a day.   Next appointment:Follow up on 10/18/2017 with Dr.Johnson. Follow up in one year for your annual wellness exam.   Preventive Care 65 Years and Older, Male Preventive care refers to lifestyle choices and visits with your health care provider that can promote health and wellness. What does preventive care include?  A yearly physical exam. This is also called an annual well check.  Dental exams once or twice a year.  Routine eye exams. Ask your health care provider how often you should have your eyes checked.  Personal lifestyle choices, including:  Daily care of your teeth and gums.  Regular physical activity.  Eating a healthy diet.  Avoiding tobacco and drug use.  Limiting alcohol use.  Practicing safe sex.  Taking low doses of aspirin every day.  Taking vitamin and mineral supplements as recommended by your health care provider. What happens during an annual well check? The services and screenings done by your health care provider during your annual well check will depend on your age,  overall health, lifestyle risk factors, and family history of disease. Counseling  Your health care provider may ask you questions about your:  Alcohol use.  Tobacco use.  Drug use.  Emotional well-being.  Home and relationship well-being.  Sexual activity.  Eating habits.  History of falls.  Memory and ability to understand (cognition).  Work and work Astronomer. Screening  You may have the following tests or measurements:  Height, weight, and BMI.  Blood pressure.  Lipid and cholesterol levels. These may be checked every 5 years, or more frequently if you are over 58 years old.  Skin check.  Lung cancer screening. You may have this screening every year starting at age 21 if you have a 30-pack-year history of smoking and currently smoke or have quit within the past 15 years.  Fecal occult blood test (FOBT) of the stool. You may have this test every year starting at age 72.  Flexible sigmoidoscopy or colonoscopy. You may have a sigmoidoscopy every 5 years or a colonoscopy every 10 years starting at age 31.  Prostate cancer screening. Recommendations will vary depending on your family history and other risks.  Hepatitis C blood test.  Hepatitis B blood test.  Sexually transmitted disease (STD) testing.  Diabetes screening. This is done by checking your blood sugar (glucose) after you have not eaten for a while (fasting). You may have this done every 1-3 years.  Abdominal aortic aneurysm (AAA) screening. You may need this if you are a current or former smoker.  Osteoporosis. You may be screened starting at age 76 if you are at high risk. Talk with your health care provider about your test results, treatment options, and if  necessary, the need for more tests. Vaccines  Your health care provider may recommend certain vaccines, such as:  Influenza vaccine. This is recommended every year.  Tetanus, diphtheria, and acellular pertussis (Tdap, Td) vaccine. You may  need a Td booster every 10 years.  Zoster vaccine. You may need this after age 57.  Pneumococcal 13-valent conjugate (PCV13) vaccine. One dose is recommended after age 74.  Pneumococcal polysaccharide (PPSV23) vaccine. One dose is recommended after age 23. Talk to your health care provider about which screenings and vaccines you need and how often you need them. This information is not intended to replace advice given to you by your health care provider. Make sure you discuss any questions you have with your health care provider. Document Released: 01/29/2015 Document Revised: 09/22/2015 Document Reviewed: 11/03/2014 Elsevier Interactive Patient Education  2017 [place] Prevention in the Home Falls can cause injuries. They can happen to people of all ages. There are many things you can do to make your home safe and to help prevent falls. What can I do on the outside of my home?  Regularly fix the edges of walkways and driveways and fix any cracks.  Remove anything that might make you trip as you walk through a door, such as a raised step or threshold.  Trim any bushes or trees on the path to your home.  Use bright outdoor lighting.  Clear any walking paths of anything that might make someone trip, such as rocks or tools.  Regularly check to see if handrails are loose or broken. Make sure that both sides of any steps have handrails.  Any raised decks and porches should have guardrails on the edges.  Have any leaves, snow, or ice cleared regularly.  Use sand or salt on walking paths during winter.  Clean up any spills in your garage right away. This includes oil or grease spills. What can I do in the bathroom?  Use night lights.  Install grab bars by the toilet and in the tub and shower. Do not use towel bars as grab bars.  Use non-skid mats or decals in the tub or shower.  If you need to sit down in the shower, use a plastic, non-slip stool.  Keep the floor  dry. Clean up any water that spills on the floor as soon as it happens.  Remove soap buildup in the tub or shower regularly.  Attach bath mats securely with double-sided non-slip rug tape.  Do not have throw rugs and other things on the floor that can make you trip. What can I do in the bedroom?  Use night lights.  Make sure that you have a light by your bed that is easy to reach.  Do not use any sheets or blankets that are too big for your bed. They should not hang down onto the floor.  Have a firm chair that has side arms. You can use this for support while you get dressed.  Do not have throw rugs and other things on the floor that can make you trip. What can I do in the kitchen?  Clean up any spills right away.  Avoid walking on wet floors.  Keep items that you use a lot in easy-to-reach places.  If you need to reach something above you, use a strong step stool that has a grab bar.  Keep electrical cords out of the way.  Do not use floor polish or wax that makes floors slippery. If you must  use wax, use non-skid floor wax.  Do not have throw rugs and other things on the floor that can make you trip. What can I do with my stairs?  Do not leave any items on the stairs.  Make sure that there are handrails on both sides of the stairs and use them. Fix handrails that are broken or loose. Make sure that handrails are as long as the stairways.  Check any carpeting to make sure that it is firmly attached to the stairs. Fix any carpet that is loose or worn.  Avoid having throw rugs at the top or bottom of the stairs. If you do have throw rugs, attach them to the floor with carpet tape.  Make sure that you have a light switch at the top of the stairs and the bottom of the stairs. If you do not have them, ask someone to add them for you. What else can I do to help prevent falls?  Wear shoes that:  Do not have high heels.  Have rubber bottoms.  Are comfortable and fit you  well.  Are closed at the toe. Do not wear sandals.  If you use a stepladder:  Make sure that it is fully opened. Do not climb a closed stepladder.  Make sure that both sides of the stepladder are locked into place.  Ask someone to hold it for you, if possible.  Clearly mark and make sure that you can see:  Any grab bars or handrails.  First and last steps.  Where the edge of each step is.  Use tools that help you move around (mobility aids) if they are needed. These include:  Canes.  Walkers.  Scooters.  Crutches.  Turn on the lights when you go into a dark area. Replace any light bulbs as soon as they burn out.  Set up your furniture so you have a clear path. Avoid moving your furniture around.  If any of your floors are uneven, fix them.  If there are any pets around you, be aware of where they are.  Review your medicines with your doctor. Some medicines can make you feel dizzy. This can increase your chance of falling. Ask your doctor what other things that you can do to help prevent falls. This information is not intended to replace advice given to you by your health care provider. Make sure you discuss any questions you have with your health care provider. Document Released: 10/29/2008 Document Revised: 06/10/2015 Document Reviewed: 02/06/2014 Elsevier Interactive Patient Education  2017 [employer].

## 2017-07-06 ENCOUNTER — Other Ambulatory Visit: Payer: Self-pay | Admitting: Family Medicine

## 2017-07-08 ENCOUNTER — Other Ambulatory Visit: Payer: Self-pay | Admitting: Family Medicine

## 2017-07-09 ENCOUNTER — Other Ambulatory Visit: Payer: Self-pay | Admitting: Family Medicine

## 2017-07-10 NOTE — Telephone Encounter (Signed)
[facility] [place]      LOV 04/17/17 with Evorn GongMegan  JohnsonMD    Last refill 4/ 2/19    [facility] [place]

## 2017-07-11 NOTE — Telephone Encounter (Signed)
Naproxen refill Last Refill:not on med list  Last OV: 05/16/16 PCP: Olevia PerchesMegan Johnson DO Pharmacy:[facility] [address]

## 2017-08-24 DIAGNOSIS — M5137 Other intervertebral disc degeneration, lumbosacral region: Secondary | ICD-10-CM | POA: Diagnosis not present

## 2017-08-24 DIAGNOSIS — M9903 Segmental and somatic dysfunction of lumbar region: Secondary | ICD-10-CM | POA: Diagnosis not present

## 2017-08-24 DIAGNOSIS — M545 Low back pain: Secondary | ICD-10-CM | POA: Diagnosis not present

## 2017-08-31 DIAGNOSIS — M545 Low back pain: Secondary | ICD-10-CM | POA: Diagnosis not present

## 2017-08-31 DIAGNOSIS — M9903 Segmental and somatic dysfunction of lumbar region: Secondary | ICD-10-CM | POA: Diagnosis not present

## 2017-08-31 DIAGNOSIS — M5137 Other intervertebral disc degeneration, lumbosacral region: Secondary | ICD-10-CM | POA: Diagnosis not present

## 2017-10-18 ENCOUNTER — Encounter: Payer: Medicare Other | Admitting: Family Medicine

## 2017-10-25 DIAGNOSIS — M9903 Segmental and somatic dysfunction of lumbar region: Secondary | ICD-10-CM | POA: Diagnosis not present

## 2017-10-25 DIAGNOSIS — M545 Low back pain: Secondary | ICD-10-CM | POA: Diagnosis not present

## 2017-10-25 DIAGNOSIS — M5137 Other intervertebral disc degeneration, lumbosacral region: Secondary | ICD-10-CM | POA: Diagnosis not present

## 2017-11-22 DIAGNOSIS — M9903 Segmental and somatic dysfunction of lumbar region: Secondary | ICD-10-CM | POA: Diagnosis not present

## 2017-11-22 DIAGNOSIS — M545 Low back pain: Secondary | ICD-10-CM | POA: Diagnosis not present

## 2017-11-22 DIAGNOSIS — M542 Cervicalgia: Secondary | ICD-10-CM | POA: Diagnosis not present

## 2017-11-22 DIAGNOSIS — M9901 Segmental and somatic dysfunction of cervical region: Secondary | ICD-10-CM | POA: Diagnosis not present

## 2018-01-31 ENCOUNTER — Encounter: Payer: Self-pay | Admitting: Family Medicine

## 2018-01-31 ENCOUNTER — Other Ambulatory Visit: Payer: Self-pay | Admitting: Family Medicine

## 2018-01-31 NOTE — Telephone Encounter (Signed)
Requested medication (s) are due for refill today: yes  Requested medication (s) are on the active medication list: yes  Last refill:  04/17/2017  Future visit scheduled: no  Notes to clinic:  Not delegated    Requested Prescriptions  Pending Prescriptions Disp Refills   cyclobenzaprine (FLEXERIL) 10 MG tablet [Pharmacy Med Name: CYCLOBENZAPRINE 10MG  TABLETS] 30 tablet 6    Sig: TAKE 1 TABLET(10 MG) BY MOUTH AT BEDTIME     Not Delegated - Analgesics:  Muscle Relaxants Failed - 01/31/2018  3:14 AM      Failed - This refill cannot be delegated      Failed - Valid encounter within last 6 months    Recent Outpatient Visits          9 months ago Essential hypertension   [facility] [place], Megan P, DO   1 year ago Essential hypertension   [facility] [place], [place], DO   1 year ago Essential hypertension   [facility] [place], [place], DO   1 year ago Essential hypertension   [facility] [place], [place], DO   1 year ago Essential hypertension   [facility] [place], Megan P, DO      Future Appointments            In 3 months [facility]

## 2018-01-31 NOTE — Telephone Encounter (Signed)
Over due for follow up

## 2018-01-31 NOTE — Telephone Encounter (Signed)
Called pt no answer, printing letter to mail.

## 2018-01-31 NOTE — Telephone Encounter (Signed)
Tried to call primary number again but no answer. Also tried to call wife's number who is listed on DPR but no answer. No VM left, no authorization to do so.

## 2018-02-05 ENCOUNTER — Ambulatory Visit (INDEPENDENT_AMBULATORY_CARE_PROVIDER_SITE_OTHER): Payer: Medicare Other | Admitting: Family Medicine

## 2018-02-05 ENCOUNTER — Encounter: Payer: Self-pay | Admitting: Family Medicine

## 2018-02-05 VITALS — BP 130/75 | HR 68 | Temp 97.8°F | Ht 72.0 in | Wt 255.0 lb

## 2018-02-05 DIAGNOSIS — I10 Essential (primary) hypertension: Secondary | ICD-10-CM | POA: Diagnosis not present

## 2018-02-05 DIAGNOSIS — M47812 Spondylosis without myelopathy or radiculopathy, cervical region: Secondary | ICD-10-CM

## 2018-02-05 DIAGNOSIS — M160 Bilateral primary osteoarthritis of hip: Secondary | ICD-10-CM | POA: Diagnosis not present

## 2018-02-05 DIAGNOSIS — E782 Mixed hyperlipidemia: Secondary | ICD-10-CM | POA: Diagnosis not present

## 2018-02-05 MED ORDER — CYCLOBENZAPRINE HCL 10 MG PO TABS: ORAL_TABLET | ORAL | 6 refills | Status: DC

## 2018-02-05 NOTE — Assessment & Plan Note (Signed)
Doing better with tilt-table. Continue PRN flexeril. Call with any concerns.  

## 2018-02-05 NOTE — Assessment & Plan Note (Signed)
Under good control on current regimen. Continue current regimen. Continue to monitor. Call with any concerns. Refills given. Labs drawn today.   

## 2018-02-05 NOTE — Progress Notes (Signed)
BP 130/75   Pulse 68   Temp 97.8 F (36.6 C) (Oral)   Ht 6' (1.829 m)   Wt 255 lb (115.7 kg)   SpO2 98%   BMI 34.58 kg/m    Subjective:    Patient ID: Kenneth Johnston, male    DOB: 1950/12/21, 68 y.o.   MRN: [id number]  HPI: Kenneth Johnston is a 68 y.o. male  Chief Complaint  Patient presents with  . Hypertension  . Hyperlipidemia   HYPERTENSION / HYPERLIPIDEMIA Satisfied with current treatment? yes Duration of hypertension: chronic BP monitoring frequency: not checking BP medication side effects: no Past BP meds: lisinopril, amlodipine Duration of hyperlipidemia: chronic Cholesterol medication side effects: no Cholesterol supplements: none Past cholesterol medications: crestor Medication compliance: excellent compliance Aspirin: no Recent stressors: no Recurrent headaches: no Visual changes: no Palpitations: no Dyspnea: no Chest pain: no Lower extremity edema: no Dizzy/lightheaded: no  Has been using a tilt-table and it seems to be really helping with his back. Has been feeling so much better he is planning to start playing golf again.   Relevant past medical, surgical, family and social history reviewed and updated as indicated. Interim medical history since our last visit reviewed. Allergies and medications reviewed and updated.  Review of Systems  Constitutional: Negative.   Respiratory: Negative.   Cardiovascular: Negative.   Musculoskeletal: Positive for back pain and myalgias. Negative for arthralgias, gait problem, joint swelling, neck pain and neck stiffness.  Skin: Negative.   Psychiatric/Behavioral: Negative.     Per HPI unless specifically indicated above     Objective:    BP 130/75   Pulse 68   Temp 97.8 F (36.6 C) (Oral)   Ht 6' (1.829 m)   Wt 255 lb (115.7 kg)   SpO2 98%   BMI 34.58 kg/m   Wt Readings from Last 3 Encounters:  02/05/18 255 lb (115.7 kg)  05/17/17 247 lb 6.4 oz (112.2 kg)  04/17/17 247 lb 1 oz (112.1 kg)    Physical Exam Vitals signs and nursing note reviewed.  Constitutional:      General: He is not in acute distress.    Appearance: Normal appearance. He is not ill-appearing, toxic-appearing or diaphoretic.  HENT:     Head: Normocephalic and atraumatic.     Right Ear: External ear normal.     Left Ear: External ear normal.     Nose: Nose normal.     Mouth/Throat:     Mouth: Mucous membranes are moist.     Pharynx: Oropharynx is clear.  Eyes:     General: No scleral icterus.       Right eye: No discharge.        Left eye: No discharge.     Extraocular Movements: Extraocular movements intact.     Conjunctiva/sclera: Conjunctivae normal.     Pupils: Pupils are equal, round, and reactive to light.  Neck:     Musculoskeletal: Normal range of motion and neck supple.  Cardiovascular:     Rate and Rhythm: Normal rate and regular rhythm.     Pulses: Normal pulses.     Heart sounds: Normal heart sounds. No murmur. No friction rub. No gallop.   Pulmonary:     Effort: Pulmonary effort is normal. No respiratory distress.     Breath sounds: Normal breath sounds. No stridor. No wheezing, rhonchi or rales.  Chest:     Chest wall: No tenderness.  Musculoskeletal: Normal range of motion.  Skin:  General: Skin is warm and dry.     Capillary Refill: Capillary refill takes less than 2 seconds.     Coloration: Skin is not jaundiced or pale.     Findings: No bruising, erythema, lesion or rash.  Neurological:     General: No focal deficit present.     Mental Status: He is alert and oriented to person, place, and time. Mental status is at baseline.  Psychiatric:        Mood and Affect: Mood normal.        Behavior: Behavior normal.        Thought Content: Thought content normal.        Judgment: Judgment normal.     Results for orders placed or performed in visit on 04/17/17  Comprehensive metabolic panel  Result Value Ref Range   Glucose 77 65 - 99 mg/dL   BUN 17 8 - 27 mg/dL    Creatinine, Ser 1.97 (H) 0.76 - 1.27 mg/dL   GFR calc non Af Amer 48 (L) >59 mL/min/1.73   GFR calc Af Amer 55 (L) >59 mL/min/1.73   BUN/Creatinine Ratio 11 10 - 24   Sodium 145 (H) 134 - 144 mmol/L   Potassium 4.2 3.5 - 5.2 mmol/L   Chloride 103 96 - 106 mmol/L   CO2 23 20 - 29 mmol/L   Calcium 9.3 8.6 - 10.2 mg/dL   Total Protein 6.9 6.0 - 8.5 g/dL   Albumin 4.1 3.6 - 4.8 g/dL   Globulin, Total 2.8 1.5 - 4.5 g/dL   Albumin/Globulin Ratio 1.5 1.2 - 2.2   Bilirubin Total 0.5 0.0 - 1.2 mg/dL   Alkaline Phosphatase 55 39 - 117 IU/L   AST 14 0 - 40 IU/L   ALT 16 0 - 44 IU/L  Lipid Panel w/o Chol/HDL Ratio  Result Value Ref Range   Cholesterol, Total 117 100 - 199 mg/dL   Triglycerides 588 (H) 0 - 149 mg/dL   HDL 31 (L) >32 mg/dL   VLDL Cholesterol Cal 35 5 - 40 mg/dL   LDL Calculated 51 0 - 99 mg/dL      Assessment & Plan:   Problem List Items Addressed This Visit      Cardiovascular and Mediastinum   HTN (hypertension) - Primary    Under good control on current regimen. Continue current regimen. Continue to monitor. Call with any concerns. Refills given. Labs drawn today.       Relevant Orders   Comprehensive metabolic panel     Musculoskeletal and Integument   Arthritis of both hips    Doing better with tilt-table. Continue PRN flexeril. Call with any concerns.       Relevant Medications   cyclobenzaprine (FLEXERIL) 10 MG tablet   DJD (degenerative joint disease) of cervical spine    Doing better with tilt-table. Continue PRN flexeril. Call with any concerns.       Relevant Medications   cyclobenzaprine (FLEXERIL) 10 MG tablet     Other   Hyperlipidemia    Under good control on current regimen. Continue current regimen. Continue to monitor. Call with any concerns. Refills given. Labs drawn today.       Relevant Orders   Comprehensive metabolic panel   Lipid Panel w/o Chol/HDL Ratio       Follow up plan: Return 3-6 months, for  Wellness/Physical.

## 2018-02-05 NOTE — Assessment & Plan Note (Signed)
Doing better with tilt-table. Continue PRN flexeril. Call with any concerns.

## 2018-02-06 ENCOUNTER — Encounter: Payer: Self-pay | Admitting: Family Medicine

## 2018-02-06 LAB — COMPREHENSIVE METABOLIC PANEL
A/G RATIO: 1.9 (ref 1.2–2.2)
ALBUMIN: 4.5 g/dL (ref 3.8–4.8)
ALK PHOS: 58 IU/L (ref 39–117)
ALT: 28 IU/L (ref 0–44)
AST: 19 IU/L (ref 0–40)
BUN / CREAT RATIO: 12 (ref 10–24)
BUN: 18 mg/dL (ref 8–27)
Bilirubin Total: 0.5 mg/dL (ref 0.0–1.2)
CALCIUM: 10 mg/dL (ref 8.6–10.2)
CO2: 21 mmol/L (ref 20–29)
CREATININE: 1.48 mg/dL — AB (ref 0.76–1.27)
Chloride: 100 mmol/L (ref 96–106)
GFR calc Af Amer: 55 mL/min/{1.73_m2} — ABNORMAL LOW (ref >59)
GFR, EST NON AFRICAN AMERICAN: 48 mL/min/{1.73_m2} — AB (ref >59)
GLOBULIN, TOTAL: 2.4 g/dL (ref 1.5–4.5)
Glucose: 92 mg/dL (ref 65–99)
POTASSIUM: 4.3 mmol/L (ref 3.5–5.2)
Sodium: 138 mmol/L (ref 134–144)
Total Protein: 6.9 g/dL (ref 6.0–8.5)

## 2018-02-06 LAB — LIPID PANEL W/O CHOL/HDL RATIO
Cholesterol, Total: 125 mg/dL (ref 100–199)
HDL: 31 mg/dL — ABNORMAL LOW (ref >39)
LDL CALC: 58 mg/dL (ref 0–99)
Triglycerides: 182 mg/dL — ABNORMAL HIGH (ref 0–149)
VLDL Cholesterol Cal: 36 mg/dL (ref 5–40)

## 2018-02-17 ENCOUNTER — Other Ambulatory Visit: Payer: Self-pay | Admitting: Family Medicine

## 2018-02-18 NOTE — Telephone Encounter (Signed)
Requested Prescriptions  Refused Prescriptions Disp Refills  . lisinopril (PRINIVIL,ZESTRIL) 20 MG tablet [Pharmacy Med Name: LISINOPRIL 20MG  TABLETS] 90 tablet 1    Sig: TAKE 1 TABLET(20 MG) BY MOUTH DAILY     Cardiovascular:  ACE Inhibitors Failed - 02/17/2018  7:25 PM      Failed - Cr in normal range and within 180 days    Creatinine, Ser  Date Value Ref Range Status  02/05/2018 1.48 (H) 0.76 - 1.27 mg/dL Final         Passed - K in normal range and within 180 days    Potassium  Date Value Ref Range Status  02/05/2018 4.3 3.5 - 5.2 mmol/L Final         Passed - Patient is not pregnant      Passed - Last BP in normal range    BP Readings from Last 1 Encounters:  02/05/18 130/75         Passed - Valid encounter within last 6 months    Recent Outpatient Visits          1 week ago Essential hypertension   [facility] [place], Megan P, DO   10 months ago Essential hypertension   [facility] [place], [place], DO   1 year ago Essential hypertension   [facility] [place], [place], DO   1 year ago Essential hypertension   [facility] [place], [place], DO   1 year ago Essential hypertension   [facility] [place], Megan P, DO      Future Appointments            In 3 months  [facility]   In 3 months Johnson, Megan P, DO [facility], [facility]

## 2018-03-17 ENCOUNTER — Other Ambulatory Visit: Payer: Self-pay | Admitting: Family Medicine

## 2018-04-08 ENCOUNTER — Other Ambulatory Visit: Payer: Self-pay | Admitting: Family Medicine

## 2018-04-09 ENCOUNTER — Other Ambulatory Visit: Payer: Self-pay | Admitting: Family Medicine

## 2018-04-10 ENCOUNTER — Other Ambulatory Visit: Payer: Self-pay | Admitting: Family Medicine

## 2018-04-10 NOTE — Telephone Encounter (Signed)
Given 6 refills 2 months ago. Should not be due.

## 2018-05-03 ENCOUNTER — Other Ambulatory Visit: Payer: Self-pay | Admitting: Family Medicine

## 2018-05-12 ENCOUNTER — Other Ambulatory Visit: Payer: Self-pay | Admitting: Family Medicine

## 2018-05-16 ENCOUNTER — Other Ambulatory Visit: Payer: Self-pay | Admitting: Family Medicine

## 2018-05-29 ENCOUNTER — Ambulatory Visit: Payer: Medicare Other

## 2018-06-01 IMAGING — CT CT HEART SCORING
2 series · 16 of 20 positions shown, 18 images · non-contrast
Comparison: None.

CLINICAL DATA: Risk stratification

EXAM:
Coronary Calcium Score
TECHNIQUE: The patient was scanned on a Siemens Somatom 64 slice scanner. Axial
non-contrast 3 mm slices were carried out through the heart. The
data set was analyzed on a dedicated work station and scored using
the Agatson method.

[Series 2: casc 3.0 i36f 2 bestdiast 73 % · axial · 0.41mm/px · z∈[-282,-150]mm · 8 of 58 slices shown, 10 images]
[im 7/58  vessel]
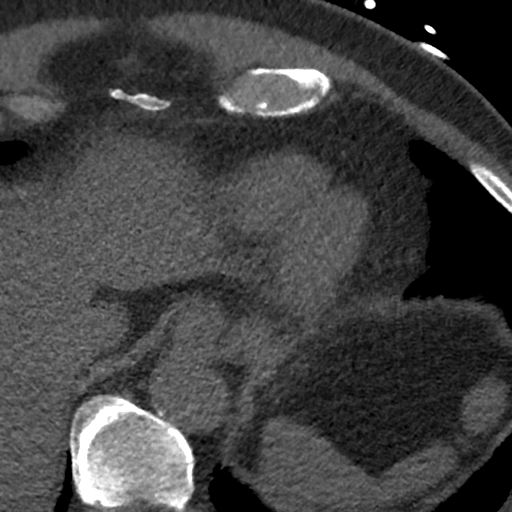
[im 7/58  lung]
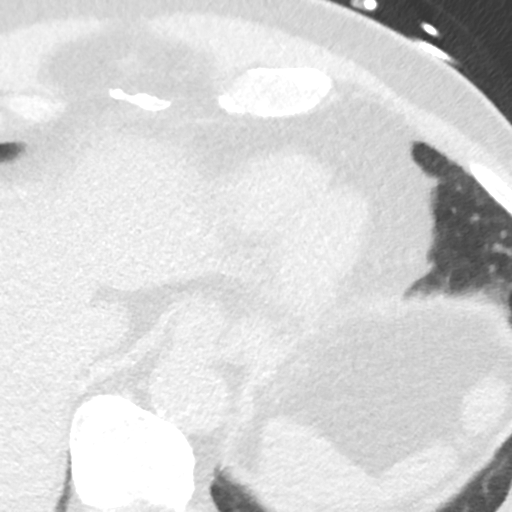
[im 13/58  vessel]
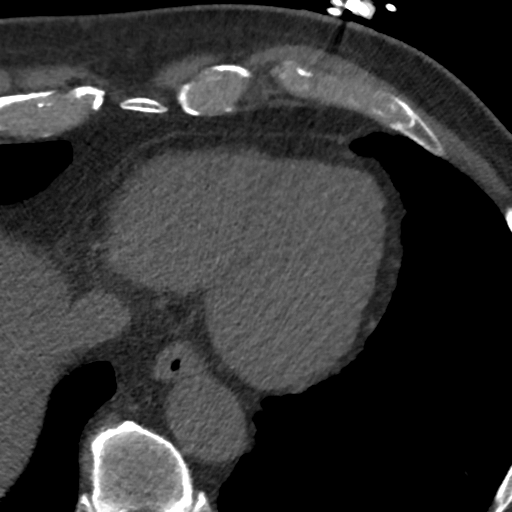
[im 20/58  vessel]
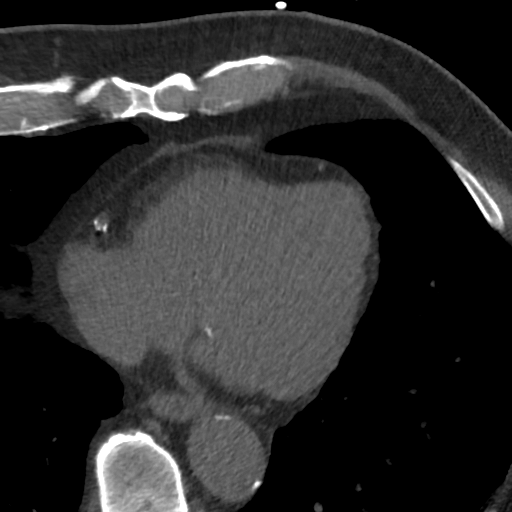
[im 26/58  vessel]
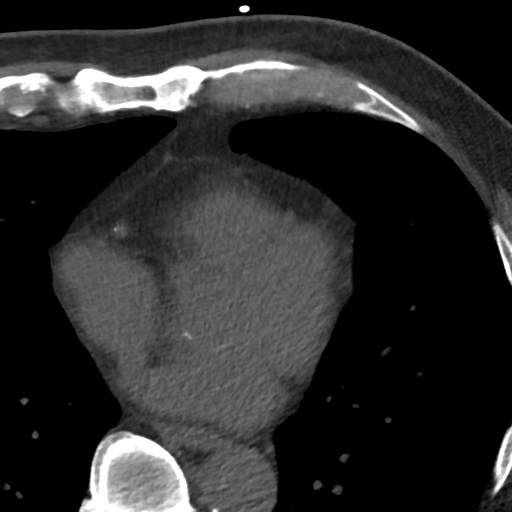
[im 32/58  vessel]
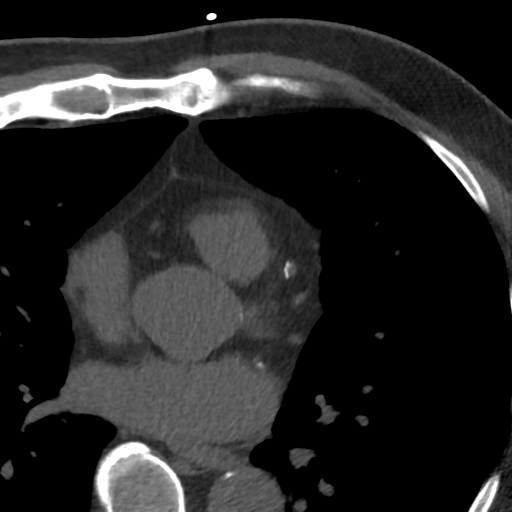
[im 32/58  lung]
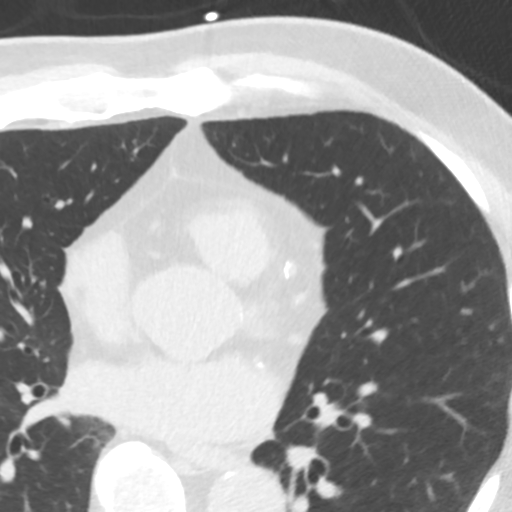
[im 39/58  vessel]
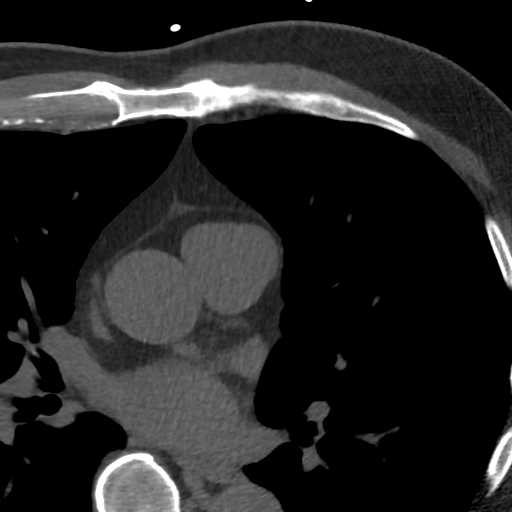
[im 45/58  vessel]
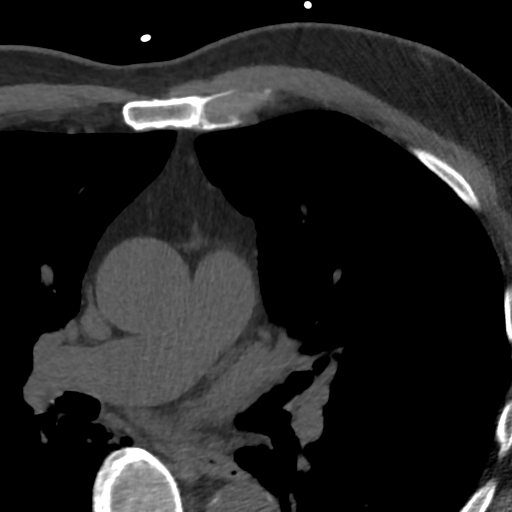
[im 51/58  vessel]
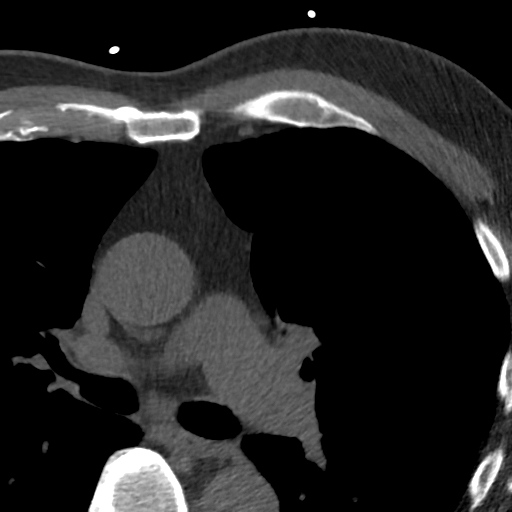

[Series 4: lung st 71 % · axial · 0.75mm/px · z∈[-282,-150]mm · 8 of 58 slices shown]
[im 7/58  lung]
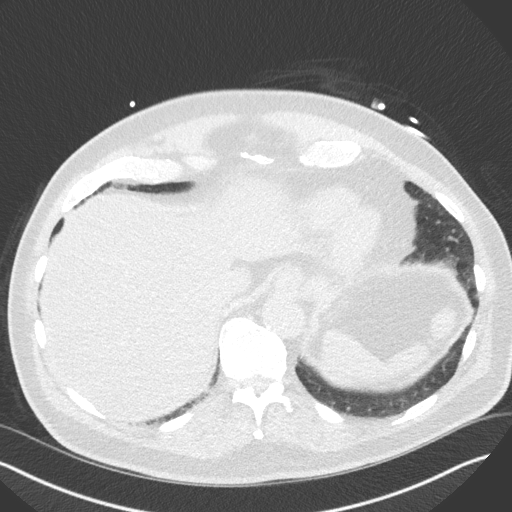
[im 13/58  lung]
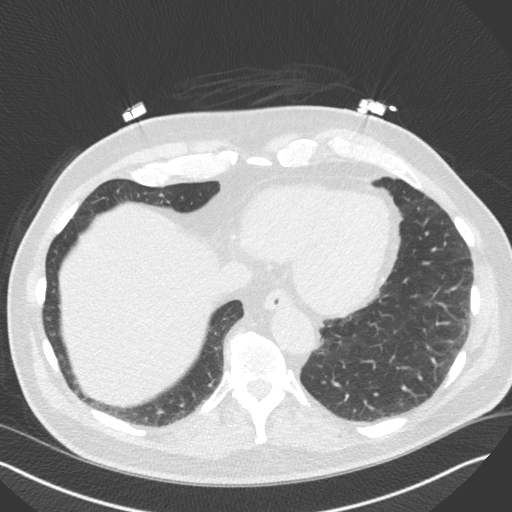
[im 20/58  lung]
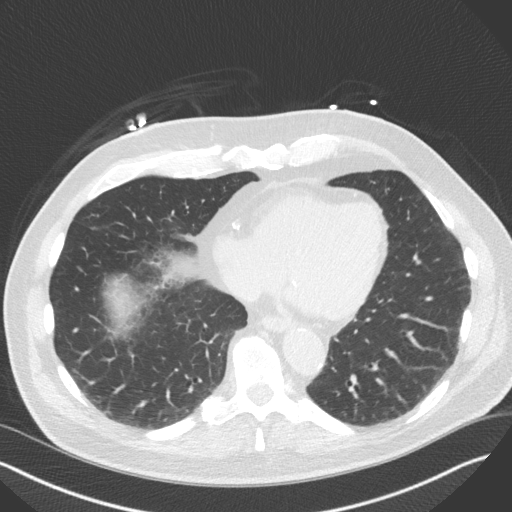
[im 26/58  lung]
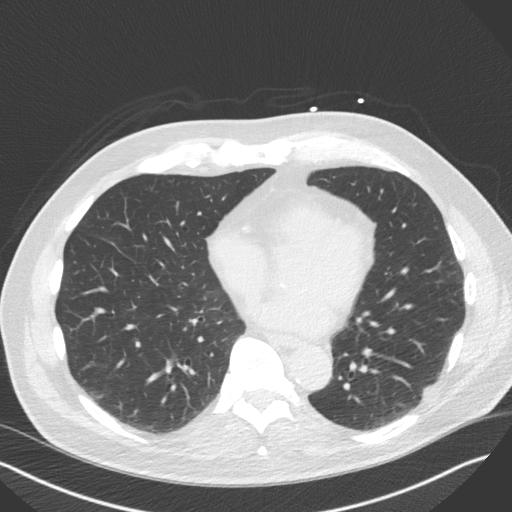
[im 32/58  lung]
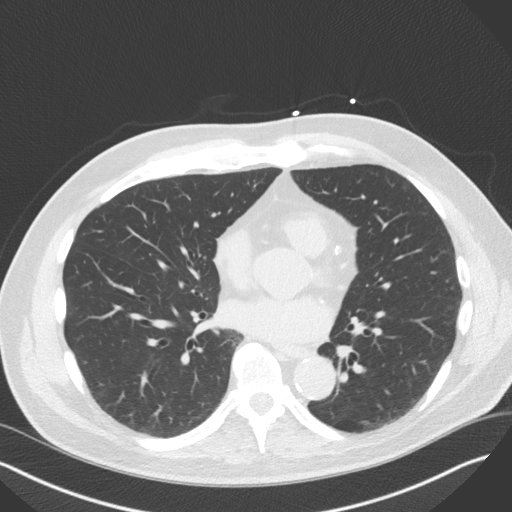
[im 39/58  lung]
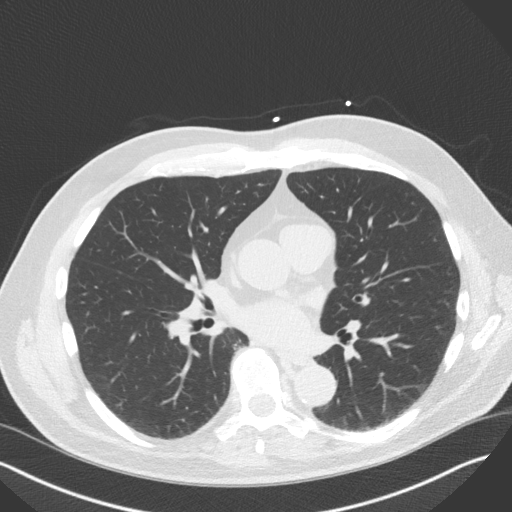
[im 45/58  lung]
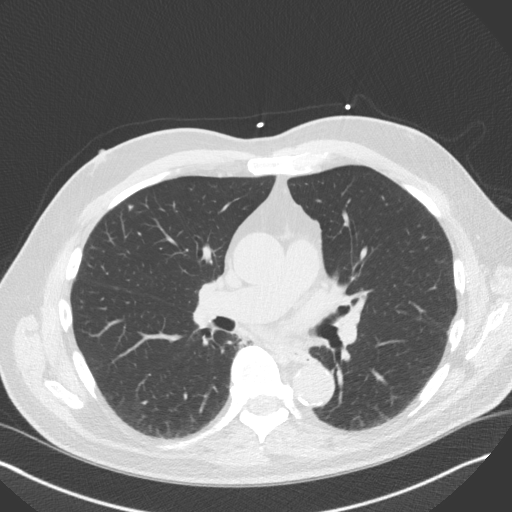
[im 51/58  lung]
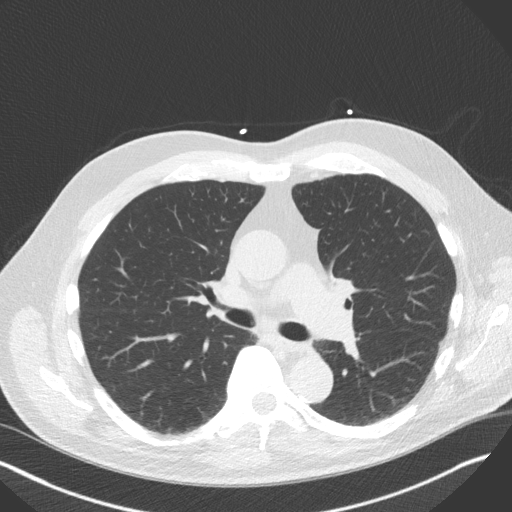

[16 of 20 positions shown; findings below may reference images not displayed]

FINDINGS: Non-cardiac: See separate report from [REDACTED].

Ascending Aorta: Aortic atherosclerosis normal diameter 3.7 cm

Pericardium: Normal

Coronary arteries: Fairly marked distal LM and 3 vessel coronary
calcium noted
IMPRESSION: Coronary calcium score of 582 . This was 79 th percentile for age
and sex matched control.

Keninjo Testen

EXAM:
OVER-READ INTERPRETATION  CT CHEST

The following report is an over-read performed by radiologist Dr.
Pk Mil [REDACTED] on 02/06/2017. This over-read
does not include interpretation of cardiac or coronary anatomy or
pathology. The coronary calcium score interpretation by the
cardiologist is attached.
FINDINGS: Vascular: Visualized aorta is normal caliber. Heart is normal size.
Scattered moderate aortic calcifications in the descending thoracic
aorta.

Mediastinum/Nodes: No adenopathy in the lower mediastinum or hila.

Lungs/Pleura: Visualized lungs clear.  No effusions.

Upper Abdomen: Fatty infiltration of the liver.  No acute findings.

Musculoskeletal: Chest wall soft tissues are unremarkable. No acute
bony abnormality.
IMPRESSION: Mild fatty infiltration of the liver.

Descending aortic atherosclerosis.

## 2018-06-12 ENCOUNTER — Encounter: Payer: Medicare Other | Admitting: Family Medicine

## 2018-06-20 ENCOUNTER — Other Ambulatory Visit: Payer: Self-pay | Admitting: Family Medicine

## 2018-06-20 NOTE — Telephone Encounter (Signed)
Requested medication (s) are due for refill today: Yes  Requested medication (s) are on the active medication list: Yes  Last refill:  2019  Future visit scheduled: Yes  Notes to clinic:  Expired Rx, unable to refill     Requested Prescriptions  Pending Prescriptions Disp Refills   amLODipine (NORVASC) 10 MG tablet [Pharmacy Med Name: AMLODIPINE BESYLATE 10MG  TABLETS] 90 tablet 3    Sig: TAKE 1 TABLET(10 MG) BY MOUTH DAILY     Cardiovascular:  Calcium Channel Blockers Passed - 06/20/2018  3:01 PM      Passed - Last BP in normal range    BP Readings from Last 1 Encounters:  02/05/18 130/75         Passed - Valid encounter within last 6 months    Recent Outpatient Visits          4 months ago Essential hypertension   [facility] [place], [place], DO   1 year ago Essential hypertension   [facility] [place], [place], DO   1 year ago Essential hypertension   [facility] [place], [place], DO   1 year ago Essential hypertension   [facility] [place], [place], DO   2 years ago Essential hypertension   [facility] [place], [place], DO      Future Appointments            In 2 months Laural Benes, Oralia Rud, DO [facility], [facility]

## 2018-06-23 ENCOUNTER — Other Ambulatory Visit: Payer: Self-pay | Admitting: Family Medicine

## 2018-06-23 NOTE — Telephone Encounter (Signed)
Requested Prescriptions  Pending Prescriptions Disp Refills  . meloxicam (MOBIC) 15 MG tablet [Pharmacy Med Name: MELOXICAM 15MG  TABLETS] 90 tablet 3    Sig: TAKE 1 TABLET(15 MG) BY MOUTH DAILY     Analgesics:  COX2 Inhibitors Failed - 06/23/2018 11:53 AM      Failed - HGB in normal range and within 360 days    Hemoglobin  Date Value Ref Range Status  05/16/2016 15.5 13.0 - 17.7 g/dL Final         Failed - Cr in normal range and within 360 days    Creatinine, Ser  Date Value Ref Range Status  02/05/2018 1.48 (H) 0.76 - 1.27 mg/dL Final         Passed - Patient is not pregnant      Passed - Valid encounter within last 12 months    Recent Outpatient Visits          4 months ago Essential hypertension   [place], [place], DO   1 year ago Essential hypertension   [place], [place], DO   1 year ago Essential hypertension   [place], [place], DO   1 year ago Essential hypertension   [place], [place], DO   2 years ago Essential hypertension   [place], [place], DO      Future Appointments            In 2 months Wynetta Emery, Barb Merino, DO [facility], [facility]

## 2018-06-24 ENCOUNTER — Other Ambulatory Visit: Payer: Self-pay | Admitting: Family Medicine

## 2018-06-24 ENCOUNTER — Other Ambulatory Visit: Payer: Self-pay | Admitting: Cardiovascular Disease

## 2018-07-09 ENCOUNTER — Other Ambulatory Visit: Payer: Self-pay | Admitting: Family Medicine

## 2018-07-23 ENCOUNTER — Other Ambulatory Visit: Payer: Self-pay | Admitting: Family Medicine

## 2018-08-11 ENCOUNTER — Other Ambulatory Visit: Payer: Self-pay | Admitting: Family Medicine

## 2018-08-12 NOTE — Telephone Encounter (Signed)
Requested Prescriptions  Pending Prescriptions Disp Refills  . lisinopril (ZESTRIL) 10 MG tablet [Pharmacy Med Name: LISINOPRIL 10MG  TABLETS] 90 tablet 0    Sig: TAKE 1 TABLET(10 MG) BY MOUTH DAILY     Cardiovascular:  ACE Inhibitors Failed - 08/11/2018  4:50 PM      Failed - Cr in normal range and within 180 days    Creatinine, Ser  Date Value Ref Range Status  02/05/2018 1.48 (H) 0.76 - 1.27 mg/dL Final         Failed - K in normal range and within 180 days    Potassium  Date Value Ref Range Status  02/05/2018 4.3 3.5 - 5.2 mmol/L Final         Failed - Valid encounter within last 6 months    Recent Outpatient Visits          6 months ago Essential hypertension   [place], Megan P, DO   1 year ago Essential hypertension   [place], [place], DO   1 year ago Essential hypertension   [place], [place], DO   2 years ago Essential hypertension   [place], [place], DO   2 years ago Essential hypertension   [place], [place], DO      Future Appointments            In 2 weeks Johnson, Megan P, DO [place], [place] - Patient is not pregnant      Passed - Last BP in normal range    BP Readings from Last 1 Encounters:  02/05/18 130/75

## 2018-08-21 ENCOUNTER — Telehealth: Payer: Self-pay | Admitting: Family Medicine

## 2018-08-21 NOTE — Telephone Encounter (Signed)
Called pt to reschedule no answer left vm to call back to reschedule

## 2018-08-29 ENCOUNTER — Encounter: Payer: Medicare Other | Admitting: Family Medicine

## 2018-10-04 ENCOUNTER — Ambulatory Visit (INDEPENDENT_AMBULATORY_CARE_PROVIDER_SITE_OTHER): Payer: Medicare Other | Admitting: Family Medicine

## 2018-10-04 ENCOUNTER — Other Ambulatory Visit: Payer: Self-pay

## 2018-10-04 ENCOUNTER — Encounter: Payer: Self-pay | Admitting: Family Medicine

## 2018-10-04 VITALS — BP 141/83 | HR 64 | Temp 98.4°F

## 2018-10-04 DIAGNOSIS — Z7189 Other specified counseling: Secondary | ICD-10-CM | POA: Diagnosis not present

## 2018-10-04 DIAGNOSIS — Z0000 Encounter for general adult medical examination without abnormal findings: Secondary | ICD-10-CM | POA: Diagnosis not present

## 2018-10-04 DIAGNOSIS — I10 Essential (primary) hypertension: Secondary | ICD-10-CM | POA: Diagnosis not present

## 2018-10-04 DIAGNOSIS — R8271 Bacteriuria: Secondary | ICD-10-CM | POA: Diagnosis not present

## 2018-10-04 DIAGNOSIS — R0609 Other forms of dyspnea: Secondary | ICD-10-CM

## 2018-10-04 DIAGNOSIS — R8281 Pyuria: Secondary | ICD-10-CM | POA: Diagnosis not present

## 2018-10-04 DIAGNOSIS — Z125 Encounter for screening for malignant neoplasm of prostate: Secondary | ICD-10-CM | POA: Diagnosis not present

## 2018-10-04 DIAGNOSIS — M47812 Spondylosis without myelopathy or radiculopathy, cervical region: Secondary | ICD-10-CM | POA: Diagnosis not present

## 2018-10-04 DIAGNOSIS — E782 Mixed hyperlipidemia: Secondary | ICD-10-CM

## 2018-10-04 MED ORDER — AMLODIPINE BESYLATE 10 MG PO TABS: 10.0000 mg | ORAL_TABLET | Freq: Every day | ORAL | 1 refills | Status: DC

## 2018-10-04 MED ORDER — ROSUVASTATIN CALCIUM 5 MG PO TABS: ORAL_TABLET | ORAL | 1 refills | Status: DC

## 2018-10-04 MED ORDER — LISINOPRIL 10 MG PO TABS: 10.0000 mg | ORAL_TABLET | Freq: Every day | ORAL | 1 refills | Status: DC

## 2018-10-04 MED ORDER — MELOXICAM 15 MG PO TABS
ORAL_TABLET | ORAL | 3 refills | Status: DC
Start: 2018-10-04 — End: 2019-06-23

## 2018-10-04 MED ORDER — CYCLOBENZAPRINE HCL 10 MG PO TABS: ORAL_TABLET | ORAL | 6 refills | Status: DC

## 2018-10-04 NOTE — Assessment & Plan Note (Signed)
A voluntary discussion about advance care planning including the explanation and discussion of advance directives was extensively discussed  with the patient for 10 minutes with patient.  Explanation about the health care proxy and Living will was reviewed and packet with forms with explanation of how to fill them out was given.  During this discussion, the patient was able to identify a health care proxy and has filled out the paperwork required.  Patient was offered a separate [place] visit for further assistance with forms.

## 2018-10-04 NOTE — Assessment & Plan Note (Signed)
Under good control on current regimen. Continue current regimen. Continue to monitor. Call with any concerns. Refills given. Labs drawn today.   

## 2018-10-04 NOTE — Patient Instructions (Addendum)
Preventative Services:  Health Risk Assessment and Personalized Prevention Plan: Done today Bone Mass Measurements: N/A CVD Screening: Done today Colon Cancer Screening: Up to date Depression Screening: Done today Diabetes Screening: Done today Glaucoma Screening: See your Eye Doctor Hepatitis B vaccine: N/A Hepatitis C screening: Up to date HIV Screening: Up to date Flu Vaccine: Declined Lung cancer Screening: N/A Obesity Screening: Done today Pneumonia Vaccines (2): Declined STI Screening: N/A PSA screening: Done today   Health Maintenance After Age 70 After age 70, you are at a higher risk for certain long-term diseases and infections as well as injuries from falls. Falls are a major cause of broken bones and head injuries in people who are older than age 70. Getting regular preventive care can help to keep you healthy and well. Preventive care includes getting regular testing and making lifestyle changes as recommended by your health care provider. Talk with your health care provider about:  Which screenings and tests you should have. A screening is a test that checks for a disease when you have no symptoms.  A diet and exercise plan that is right for you. What should I know about screenings and tests to prevent falls? Screening and testing are the best ways to find a health problem early. Early diagnosis and treatment give you the best chance of managing medical conditions that are common after age 70. Certain conditions and lifestyle choices may make you more likely to have a fall. Your health care provider may recommend:  Regular vision checks. Poor vision and conditions such as cataracts can make you more likely to have a fall. If you wear glasses, make sure to get your prescription updated if your vision changes.  Medicine review. Work with your health care provider to regularly review all of the medicines you are taking, including over-the-counter medicines. Ask your health  care provider about any side effects that may make you more likely to have a fall. Tell your health care provider if any medicines that you take make you feel dizzy or sleepy.  Osteoporosis screening. Osteoporosis is a condition that causes the bones to get weaker. This can make the bones weak and cause them to break more easily.  Blood pressure screening. Blood pressure changes and medicines to control blood pressure can make you feel dizzy.  Strength and balance checks. Your health care provider may recommend certain tests to check your strength and balance while standing, walking, or changing positions.  Foot health exam. Foot pain and numbness, as well as not wearing proper footwear, can make you more likely to have a fall.  Depression screening. You may be more likely to have a fall if you have a fear of falling, feel emotionally low, or feel unable to do activities that you used to do.  Alcohol use screening. Using too much alcohol can affect your balance and may make you more likely to have a fall. What actions can I take to lower my risk of falls? General instructions  Talk with your health care provider about your risks for falling. Tell your health care provider if: ? You fall. Be sure to tell your health care provider about all falls, even ones that seem minor. ? You feel dizzy, sleepy, or off-balance.  Take over-the-counter and prescription medicines only as told by your health care provider. These include any supplements.  Eat a healthy diet and maintain a healthy weight. A healthy diet includes low-fat dairy products, low-fat (lean) meats, and fiber from whole grains,  beans, and lots of fruits and vegetables. Home safety  Remove any tripping hazards, such as rugs, cords, and clutter.  Install safety equipment such as grab bars in bathrooms and safety rails on stairs.  Keep rooms and walkways well-lit. Activity   Follow a regular exercise program to stay fit. This will  help you maintain your balance. Ask your health care provider what types of exercise are appropriate for you.  If you need a cane or walker, use it as recommended by your health care provider.  Wear supportive shoes that have nonskid soles. Lifestyle  Do not drink alcohol if your health care provider tells you not to drink.  If you drink alcohol, limit how much you have: ? 0-1 drink a day for women. ? 0-2 drinks a day for men.  Be aware of how much alcohol is in your drink. In the [place] one drink equals one typical bottle of beer (12 oz), one-half glass of wine (5 oz), or one shot of hard liquor (1 oz).  Do not use any products that contain nicotine or tobacco, such as cigarettes and e-cigarettes. If you need help quitting, ask your health care provider. Summary  Having a healthy lifestyle and getting preventive care can help to protect your health and wellness after age 5.  Screening and testing are the best way to find a health problem early and help you avoid having a fall. Early diagnosis and treatment give you the best chance for managing medical conditions that are more common for people who are older than age 90.  Falls are a major cause of broken bones and head injuries in people who are older than age 41. Take precautions to prevent a fall at home.  Work with your health care provider to learn what changes you can make to improve your health and wellness and to prevent falls. This information is not intended to replace advice given to you by your health care provider. Make sure you discuss any questions you have with your health care provider. Document Released: 11/15/2016 Document Revised: 04/25/2018 Document Reviewed: 11/15/2016 Elsevier Patient Education  2020 [employer].

## 2018-10-04 NOTE — Assessment & Plan Note (Signed)
Borderline. Will continue current regimen. Continue to monitor. Call with any concerns. Refills given. Checking labs today.

## 2018-10-04 NOTE — Progress Notes (Signed)
BP (!) 141/83   Pulse 64   Temp 98.4 F (36.9 C)   SpO2 96%    Subjective:    Patient ID: Kenneth Johnston, male    DOB: December 05, 1950, 68 y.o.   MRN: [id number]  HPI: Kenneth Johnston is a 68 y.o. male presenting on 10/04/2018 for comprehensive medical examination. Current medical complaints include:  Had an episode of syncope about 2 months ago, was not hypotensive (170/97). Did not go to the ER when this happened. He has been having some SOB with exertion. Having diaphoresis. No chest pain.  HYPERTENSION / HYPERLIPIDEMIA Satisfied with current treatment? yes Duration of hypertension: chronic BP monitoring frequency: not checking BP medication side effects: no Past BP meds: lisinopril, amlodipine Duration of hyperlipidemia: chronic Cholesterol medication side effects: no Cholesterol supplements: none Past cholesterol medications: crestor Medication compliance: excellent compliance Aspirin: no Recent stressors: no Recurrent headaches: no Visual changes: no Palpitations: no Dyspnea: yes Chest pain: no Lower extremity edema: no Dizzy/lightheaded: no  He currently lives with: wife Interim Problems from his last visit: yes  Functional Status Survey: Is the patient deaf or have difficulty hearing?: No Does the patient have difficulty seeing, even when wearing glasses/contacts?: No Does the patient have difficulty concentrating, remembering, or making decisions?: No Does the patient have difficulty walking or climbing stairs?: Yes(Shortness of breath) Does the patient have difficulty dressing or bathing?: No Does the patient have difficulty doing errands alone such as visiting a doctor's office or shopping?: No  FALL RISK: Fall Risk  10/04/2018 02/05/2018 05/17/2017 05/16/2016 04/30/2015  Falls in the past year? 0 0 No Yes No  Number falls in past yr: 0 - - 1 -  Injury with Fall? 0 - - Yes -  Follow up - Falls evaluation completed - - -    Depression Screen Depression  screen [facility] 2/9 10/04/2018 05/17/2017 05/16/2016 04/30/2015 03/18/2015  Decreased Interest 0 0 0 0 0  Down, Depressed, Hopeless 0 0 0 0 0  PHQ - 2 Score 0 0 0 0 0    Advanced Directives <no information>  Past Medical History:  Past Medical History:  Diagnosis Date  . Allergy   . Arthritis   . Hernia of abdominal cavity 03/10/2015  . Hypertension   . Umbilical hernia 2017  . Umbilical hernia 03/04/2015    Surgical History:  Past Surgical History:  Procedure Laterality Date  . KNEE CARTILAGE SURGERY Bilateral     Medications:  No current outpatient medications on file prior to visit.   No current facility-administered medications on file prior to visit.     Allergies:  No Known Allergies  Social History:  Social History   Socioeconomic History  . Marital status: Married    Spouse name: Not on file  . Number of children: Not on file  . Years of education: Not on file  . Highest education level: Not on file  Occupational History  . Not on file  Social Needs  . Financial resource strain: Not hard at all  . Food insecurity    Worry: Never true    Inability: Never true  . Transportation needs    Medical: No    Non-medical: No  Tobacco Use  . Smoking status: Former Smoker    Quit date: 03/04/1995    Years since quitting: 23.6  . Smokeless tobacco: Never Used  Substance and Sexual Activity  . Alcohol use: Yes    Comment: very seldom   . Drug use: No  .  Sexual activity: Yes  Lifestyle  . Physical activity    Days per week: 0 days    Minutes per session: 0 min  . Stress: Not at all  Relationships  . Social connections    Talks on phone: More than three times a week    Gets together: More than three times a week    Attends religious service: More than 4 times per year    Active member of club or organization: No    Attends meetings of clubs or organizations: Never    Relationship status: Married  . Intimate partner violence    Fear of current or ex partner: No     Emotionally abused: No    Physically abused: No    Forced sexual activity: No  Other Topics Concern  . Not on file  Social History Narrative   [place]    Social History   Tobacco Use  Smoking Status Former Smoker  . Quit date: 03/04/1995  . Years since quitting: 23.6  Smokeless Tobacco Never Used   Social History   Substance and Sexual Activity  Alcohol Use Yes   Comment: very seldom     Family History:  Family History  Problem Relation Age of Onset  . Kidney failure Mother   . Aneurysm Father        brain  . Kidney disease Paternal Grandmother   . Heart attack Paternal Grandfather     Past medical history, surgical history, medications, allergies, family history and social history reviewed with patient today and changes made to appropriate areas of the chart.   Review of Systems  Constitutional: Positive for diaphoresis. Negative for chills, fever, malaise/fatigue and weight loss.  HENT: Negative.   Eyes: Negative.   Respiratory: Positive for shortness of breath. Negative for cough, hemoptysis, sputum production and wheezing.   Cardiovascular: Negative.  Negative for chest pain, palpitations, orthopnea, claudication, leg swelling and PND.  Gastrointestinal: Positive for constipation. Negative for abdominal pain, blood in stool, diarrhea, heartburn, melena, nausea and vomiting.  Genitourinary: Negative.   Musculoskeletal: Negative.   Skin: Negative.   Neurological: Positive for headaches. Negative for dizziness, tingling, tremors, sensory change, speech change, focal weakness, seizures, loss of consciousness and weakness.  Endo/Heme/Allergies: Positive for environmental allergies. Negative for polydipsia. Bruises/bleeds easily.  Psychiatric/Behavioral: Negative.     All other ROS negative except what is listed above and in the HPI.      Objective:    BP (!) 141/83   Pulse 64   Temp 98.4 F (36.9 C)   SpO2 96%   Wt Readings from Last 3 Encounters:  02/05/18  255 lb (115.7 kg)  05/17/17 247 lb 6.4 oz (112.2 kg)  04/17/17 247 lb 1 oz (112.1 kg)    Physical Exam Vitals signs and nursing note reviewed.  Constitutional:      General: He is not in acute distress.    Appearance: Normal appearance. He is obese. He is not ill-appearing, toxic-appearing or diaphoretic.  HENT:     Head: Normocephalic and atraumatic.     Right Ear: Tympanic membrane, ear canal and external ear normal. There is no impacted cerumen.     Left Ear: Tympanic membrane, ear canal and external ear normal. There is no impacted cerumen.     Nose: Nose normal. No congestion or rhinorrhea.     Mouth/Throat:     Mouth: Mucous membranes are moist.     Pharynx: Oropharynx is clear. No oropharyngeal exudate or posterior oropharyngeal erythema.  Eyes:     General: No scleral icterus.       Right eye: No discharge.        Left eye: No discharge.     Extraocular Movements: Extraocular movements intact.     Conjunctiva/sclera: Conjunctivae normal.     Pupils: Pupils are equal, round, and reactive to light.  Neck:     Musculoskeletal: Normal range of motion and neck supple. No neck rigidity or muscular tenderness.     Vascular: No carotid bruit.  Cardiovascular:     Rate and Rhythm: Normal rate and regular rhythm.     Pulses: Normal pulses.     Heart sounds: No murmur. No friction rub. No gallop.   Pulmonary:     Effort: Pulmonary effort is normal. No respiratory distress.     Breath sounds: Normal breath sounds. No stridor. No wheezing, rhonchi or rales.  Chest:     Chest wall: No tenderness.  Abdominal:     General: Abdomen is flat. Bowel sounds are normal. There is no distension.     Palpations: Abdomen is soft. There is no mass.     Tenderness: There is no abdominal tenderness. There is no right CVA tenderness, left CVA tenderness, guarding or rebound.     Hernia: No hernia is present.  Genitourinary:    Comments: Genital exam deferred with shared decision making  Musculoskeletal:        General: No swelling, tenderness, deformity or signs of injury.     Right lower leg: No edema.     Left lower leg: No edema.  Lymphadenopathy:     Cervical: No cervical adenopathy.  Skin:    General: Skin is warm and dry.     Capillary Refill: Capillary refill takes less than 2 seconds.     Coloration: Skin is not jaundiced or pale.     Findings: No bruising, erythema, lesion or rash.  Neurological:     General: No focal deficit present.     Mental Status: He is alert and oriented to person, place, and time.     Cranial Nerves: No cranial nerve deficit.     Sensory: No sensory deficit.     Motor: No weakness.     Coordination: Coordination normal.     Gait: Gait normal.     Deep Tendon Reflexes: Reflexes normal.  Psychiatric:        Mood and Affect: Mood normal.        Behavior: Behavior normal.        Thought Content: Thought content normal.        Judgment: Judgment normal.     6CIT Screen 10/04/2018 05/17/2017 05/16/2016  What Year? 0 points 0 points 0 points  What month? 0 points 0 points 0 points  What time? 0 points 0 points 0 points  Count back from 20 0 points 0 points 0 points  Months in reverse 0 points 0 points 0 points  Repeat phrase 2 points 0 points 4 points  Total Score 2 0 4     Results for orders placed or performed in visit on 02/05/18  Comprehensive metabolic panel  Result Value Ref Range   Glucose 92 65 - 99 mg/dL   BUN 18 8 - 27 mg/dL   Creatinine, Ser 1.48 (H) 0.76 - 1.27 mg/dL   GFR calc non Af Amer 48 (L) >59 mL/min/1.73   GFR calc Af Amer 55 (L) >59 mL/min/1.73   BUN/Creatinine Ratio 12 10 - 24  Sodium 138 134 - 144 mmol/L   Potassium 4.3 3.5 - 5.2 mmol/L   Chloride 100 96 - 106 mmol/L   CO2 21 20 - 29 mmol/L   Calcium 10.0 8.6 - 10.2 mg/dL   Total Protein 6.9 6.0 - 8.5 g/dL   Albumin 4.5 3.8 - 4.8 g/dL   Globulin, Total 2.4 1.5 - 4.5 g/dL   Albumin/Globulin Ratio 1.9 1.2 - 2.2   Bilirubin Total 0.5 0.0 - 1.2 mg/dL    Alkaline Phosphatase 58 39 - 117 IU/L   AST 19 0 - 40 IU/L   ALT 28 0 - 44 IU/L  Lipid Panel w/o Chol/HDL Ratio  Result Value Ref Range   Cholesterol, Total 125 100 - 199 mg/dL   Triglycerides 161182 (H) 0 - 149 mg/dL   HDL 31 (L) >09>39 mg/dL   VLDL Cholesterol Cal 36 5 - 40 mg/dL   LDL Calculated 58 0 - 99 mg/dL      Assessment & Plan:   Problem List Items Addressed This Visit      Cardiovascular and Mediastinum   HTN (hypertension)    Borderline. Will continue current regimen. Continue to monitor. Call with any concerns. Refills given. Checking labs today.      Relevant Medications   amLODipine (NORVASC) 10 MG tablet   lisinopril (ZESTRIL) 10 MG tablet   rosuvastatin (CRESTOR) 5 MG tablet   Other Relevant Orders   CBC with Differential/Platelet   Comprehensive metabolic panel   Microalbumin, Urine Waived   TSH   UA/M w/rflx Culture, Routine     Musculoskeletal and Integument   DJD (degenerative joint disease) of cervical spine    Stable. Refill flexeril and meloxicam given today. Call with any concerns.       Relevant Medications   cyclobenzaprine (FLEXERIL) 10 MG tablet   meloxicam (MOBIC) 15 MG tablet   Other Relevant Orders   CBC with Differential/Platelet   Comprehensive metabolic panel   TSH   UA/M w/rflx Culture, Routine     Other   Hyperlipidemia    Under good control on current regimen. Continue current regimen. Continue to monitor. Call with any concerns. Refills given. Labs drawn today.       Relevant Medications   amLODipine (NORVASC) 10 MG tablet   lisinopril (ZESTRIL) 10 MG tablet   rosuvastatin (CRESTOR) 5 MG tablet   Other Relevant Orders   CBC with Differential/Platelet   Comprehensive metabolic panel   Lipid Panel w/o Chol/HDL Ratio   TSH   UA/M w/rflx Culture, Routine   Advance directive discussed with patient    A voluntary discussion about advance care planning including the explanation and discussion of advance directives was  extensively discussed  with the patient for 10 minutes with patient.  Explanation about the health care proxy and Living will was reviewed and packet with forms with explanation of how to fill them out was given.  During this discussion, the patient was able to identify a health care proxy and has filled out the paperwork required.  Patient was offered a separate Advance Care Planning visit for further assistance with forms.          Other Visit Diagnoses    Encounter for Medicare annual wellness exam    -  Primary   Preventative care discussed today as below.    Routine general medical examination at a health care facility       Vaccines declined. Screening labs checked today. Colonoscopy up to date. Continue diet  and exercise. Call with any concerns.   Dyspnea on exertion       Worsening over the past few months- EKG normal, lungs clear. Will get him into see cardiology. Referral generated today. Checking labs today.   Relevant Orders   EKG 12-Lead (Completed)   Ambulatory referral to Cardiology   CBC with Differential/Platelet   Comprehensive metabolic panel   TSH   UA/M w/rflx Culture, Routine   Screening for prostate cancer       Labs drawn today.   Relevant Orders   PSA       Preventative Services:  Health Risk Assessment and Personalized Prevention Plan: Done today Bone Mass Measurements: N/A CVD Screening: Done today Colon Cancer Screening: Up to date Depression Screening: Done today Diabetes Screening: Done today Glaucoma Screening: See your Eye Doctor Hepatitis B vaccine: N/A Hepatitis C screening: Up to date HIV Screening: Up to date Flu Vaccine: Declined Lung cancer Screening: N/A Obesity Screening: Done today Pneumonia Vaccines (2): Declined STI Screening: N/A PSA screening: Done today  Discussed aspirin prophylaxis for myocardial infarction prevention and decision was made to start ASA  LABORATORY TESTING:  Health maintenance labs ordered today as  discussed above.   The natural history of prostate cancer and ongoing controversy regarding screening and potential treatment outcomes of prostate cancer has been discussed with the patient. The meaning of a false positive PSA and a false negative PSA has been discussed. He indicates understanding of the limitations of this screening test and wishes to proceed with screening PSA testing.   IMMUNIZATIONS:   - Tdap: Tetanus vaccination status reviewed: Refused. - Influenza: Refused - Pneumovax: Refused - Prevnar: Refused - Zostavax vaccine: Refused  SCREENING: - Colonoscopy: Up to date  Discussed with patient purpose of the colonoscopy is to detect colon cancer at curable precancerous or early stages   PATIENT COUNSELING:    Sexuality: Discussed sexually transmitted diseases, partner selection, use of condoms, avoidance of unintended pregnancy  and contraceptive alternatives.   Advised to avoid cigarette smoking.  I discussed with the patient that most people either abstain from alcohol or drink within safe limits (<=14/week and <=4 drinks/occasion for males, <=7/weeks and <= 3 drinks/occasion for females) and that the risk for alcohol disorders and other health effects rises proportionally with the number of drinks per week and how often a drinker exceeds daily limits.  Discussed cessation/primary prevention of drug use and availability of treatment for abuse.   Diet: Encouraged to adjust caloric intake to maintain  or achieve ideal body weight, to reduce intake of dietary saturated fat and total fat, to limit sodium intake by avoiding high sodium foods and not adding table salt, and to maintain adequate dietary potassium and calcium preferably from fresh fruits, vegetables, and low-fat dairy products.    stressed the importance of regular exercise  Injury prevention: Discussed safety belts, safety helmets, smoke detector, smoking near bedding or upholstery.   Dental health: Discussed  importance of regular tooth brushing, flossing, and dental visits.   Follow up plan: NEXT PREVENTATIVE PHYSICAL DUE IN 1 YEAR. Return in about 6 months (around 04/03/2019).

## 2018-10-04 NOTE — Assessment & Plan Note (Signed)
Stable. Refill flexeril and meloxicam given today. Call with any concerns.

## 2018-10-05 LAB — COMPREHENSIVE METABOLIC PANEL
ALT: 33 IU/L (ref 0–44)
AST: 27 IU/L (ref 0–40)
Albumin/Globulin Ratio: 2 (ref 1.2–2.2)
Albumin: 4.7 g/dL (ref 3.8–4.8)
Alkaline Phosphatase: 59 IU/L (ref 39–117)
BUN/Creatinine Ratio: 15 (ref 10–24)
BUN: 22 mg/dL (ref 8–27)
Bilirubin Total: 0.5 mg/dL (ref 0.0–1.2)
CO2: 18 mmol/L — ABNORMAL LOW (ref 20–29)
Calcium: 10 mg/dL (ref 8.6–10.2)
Chloride: 106 mmol/L (ref 96–106)
Creatinine, Ser: 1.43 mg/dL — ABNORMAL HIGH (ref 0.76–1.27)
GFR calc Af Amer: 57 mL/min/{1.73_m2} — ABNORMAL LOW (ref >59)
GFR calc non Af Amer: 50 mL/min/{1.73_m2} — ABNORMAL LOW (ref >59)
Globulin, Total: 2.4 g/dL (ref 1.5–4.5)
Glucose: 110 mg/dL — ABNORMAL HIGH (ref 65–99)
Potassium: 4.5 mmol/L (ref 3.5–5.2)
Sodium: 143 mmol/L (ref 134–144)
Total Protein: 7.1 g/dL (ref 6.0–8.5)

## 2018-10-05 LAB — CBC WITH DIFFERENTIAL/PLATELET
Basophils Absolute: 0.1 10*3/uL (ref 0.0–0.2)
Basos: 1 %
EOS (ABSOLUTE): 0.3 10*3/uL (ref 0.0–0.4)
Eos: 4 %
Hematocrit: 49.2 % (ref 37.5–51.0)
Hemoglobin: 16.9 g/dL (ref 13.0–17.7)
Immature Grans (Abs): 0 10*3/uL (ref 0.0–0.1)
Immature Granulocytes: 0 %
Lymphocytes Absolute: 1.8 10*3/uL (ref 0.7–3.1)
Lymphs: 19 %
MCH: 29.2 pg (ref 26.6–33.0)
MCHC: 34.3 g/dL (ref 31.5–35.7)
MCV: 85 fL (ref 79–97)
Monocytes Absolute: 0.8 10*3/uL (ref 0.1–0.9)
Monocytes: 8 %
Neutrophils Absolute: 6.5 10*3/uL (ref 1.4–7.0)
Neutrophils: 68 %
Platelets: 160 10*3/uL (ref 150–450)
RBC: 5.79 x10E6/uL (ref 4.14–5.80)
RDW: 13 % (ref 11.6–15.4)
WBC: 9.5 10*3/uL (ref 3.4–10.8)

## 2018-10-05 LAB — LIPID PANEL W/O CHOL/HDL RATIO
Cholesterol, Total: 131 mg/dL (ref 100–199)
HDL: 31 mg/dL — ABNORMAL LOW (ref >39)
LDL Chol Calc (NIH): 63 mg/dL (ref 0–99)
Triglycerides: 224 mg/dL — ABNORMAL HIGH (ref 0–149)
VLDL Cholesterol Cal: 37 mg/dL (ref 5–40)

## 2018-10-05 LAB — TSH: TSH: 5.02 u[IU]/mL — ABNORMAL HIGH (ref 0.450–4.500)

## 2018-10-05 LAB — PSA: Prostate Specific Ag, Serum: 1.9 ng/mL (ref 0.0–4.0)

## 2018-10-08 ENCOUNTER — Telehealth: Payer: Self-pay | Admitting: Family Medicine

## 2018-10-08 ENCOUNTER — Telehealth: Payer: Self-pay | Admitting: Cardiovascular Disease

## 2018-10-08 DIAGNOSIS — R7989 Other specified abnormal findings of blood chemistry: Secondary | ICD-10-CM

## 2018-10-08 LAB — UA/M W/RFLX CULTURE, ROUTINE
Bilirubin, UA: NEGATIVE
Glucose, UA: NEGATIVE
Ketones, UA: NEGATIVE
Nitrite, UA: NEGATIVE
Specific Gravity, UA: 1.025 (ref 1.005–1.030)
Urobilinogen, Ur: 0.2 mg/dL (ref 0.2–1.0)
pH, UA: 5 (ref 5.0–7.5)

## 2018-10-08 LAB — URINE CULTURE, REFLEX

## 2018-10-08 LAB — MICROSCOPIC EXAMINATION: WBC, UA: >30 /hpf — AB (ref 0–5)

## 2018-10-08 LAB — MICROALBUMIN, URINE WAIVED
Creatinine, Urine Waived: 200 mg/dL (ref 10–300)
Microalb, Ur Waived: 80 mg/L — ABNORMAL HIGH (ref 0–19)

## 2018-10-08 MED ORDER — NITROFURANTOIN MONOHYD MACRO 100 MG PO CAPS: 100.0000 mg | ORAL_CAPSULE | Freq: Two times a day (BID) | ORAL | 0 refills | Status: DC

## 2018-10-08 NOTE — Telephone Encounter (Signed)
Please let him know that his labs look stable. He has a UTI, so I've sent him an antibiotic to his pharmacy. His thyroid was also slightly underactive- so I'd like him to just come back in a month to see if it's a fluke or an actual problem with his thyroid. Thanks! (order in)

## 2018-10-08 NOTE — Telephone Encounter (Signed)
Patient notified and verbalized understanding. Lab appointment scheduled. 

## 2018-10-15 NOTE — Progress Notes (Signed)
Virtual Visit via Video Note   This visit type was conducted due to national recommendations for restrictions regarding the COVID-19 Pandemic (e.g. social distancing) in an effort to limit this patient's exposure and mitigate transmission in our community.  Due to his co-morbid illnesses, this patient is at least at moderate risk for complications without adequate follow up.  This format is felt to be most appropriate for this patient at this time.  All issues noted in this document were discussed and addressed.  A limited physical exam was performed with this format.  Please refer to the patient's chart for his consent to telehealth for [facility].   I connected with  Lovie Macadamia on 10/16/18 by a video enabled telemedicine application and verified that I am speaking with the correct person using two identifiers. I discussed the limitations of evaluation and management by telemedicine. The patient expressed understanding and agreed to proceed.   Evaluation Performed:  Follow-up visit  Date:  10/16/2018   ID:  Kenneth Johnston, DOB 21-Apr-1950, MRN [id number]  Patient Location:  [address] [place] [address]   Provider location:   [facility], [place] office  PCP:  Valerie Roys, DO  Cardiologist:  Patsy Baltimore   Chief Complaint:  Sweating, SOB   History of Present Illness:    Kenneth Johnston is a 68 y.o. male who presents via audio/video conferencing for a telehealth visit today.   The patient does not symptoms concerning for COVID-19 infection (fever, chills, cough, or new SHORTNESS OF BREATH).   Patient has a past medical history of Smoking pipes, stopped 20 years ago Obesity Hypertension Chronic  back pain Coronary calcium score of 582 . This was 24 th percentile for age and sex matched control. Who presents by referral from Dr. 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  The left ventricular ejection fraction is hyperdynamic (>65%).  This is a low risk study.  Poor excercise capacity; study was converted from exercise due to fatigue after 3:37 minutes.  Frequent PVCs and brief run of SVT noted during exercise. Correlate clinically.   Prior CV studies:   The following studies were reviewed today:   Past Medical History:  Diagnosis Date  . Allergy   . Arthritis   . Hernia of abdominal cavity 03/10/2015  . Hypertension   . Umbilical hernia 8250  . Umbilical hernia 5/39/7673   Past Surgical History:  Procedure Laterality Date  . KNEE CARTILAGE SURGERY Bilateral       Allergies:   Patient has no known allergies.   Social History   Tobacco Use  . Smoking status: Former Smoker    Quit date: 03/04/1995    Years since quitting: 23.6  . Smokeless tobacco:  Never Used  Substance Use Topics  . Alcohol use: Yes    Comment: very seldom   . Drug use: No     Current Outpatient Medications on File Prior to Visit  Medication Sig Dispense Refill  . amLODipine (NORVASC) 10 MG tablet Take 1 tablet (10 mg total) by mouth daily. 90 tablet 1  . cyclobenzaprine (FLEXERIL) 10 MG tablet TAKE 1 TABLET(10 MG) BY MOUTH AT BEDTIME PRN 30 tablet 6  . lisinopril (ZESTRIL) 10 MG tablet Take 1 tablet (10 mg total) by mouth daily. 90  tablet 1  . meloxicam (MOBIC) 15 MG tablet TAKE 1 TABLET(15 MG) BY MOUTH DAILY 90 tablet 3  . nitrofurantoin, macrocrystal-monohydrate, (MACROBID) 100 MG capsule Take 1 capsule (100 mg total) by mouth 2 (two) times daily. 14 capsule 0  . rosuvastatin (CRESTOR) 5 MG tablet TAKE 1 TABLET(5 MG) BY MOUTH DAILY 90 tablet 1   No current facility-administered medications on file prior to visit.      Family Hx: The patient's family history includes Aneurysm in his father; Heart attack in his paternal grandfather; Kidney disease in his paternal grandmother; Kidney failure in his mother.  ROS:   Please see the history of present illness.    Review of Systems  Constitutional: Positive for diaphoresis.  HENT: Negative.   Respiratory: Positive for shortness of breath.   Cardiovascular: Negative.   Gastrointestinal: Negative.   Musculoskeletal: Negative.   Neurological: Negative.   Psychiatric/Behavioral: Negative.   All other systems reviewed and are negative.    Labs/Other Tests and Data Reviewed:    Recent Labs: 10/04/2018: ALT 33; BUN 22; Creatinine, Ser 1.43; Hemoglobin 16.9; Platelets 160; Potassium 4.5; Sodium 143; TSH 5.020   Recent Lipid Panel Lab Results  Component Value Date/Time   CHOL 131 10/04/2018 10:38 AM   TRIG 224 (H) 10/04/2018 10:38 AM   HDL 31 (L) 10/04/2018 10:38 AM   LDLCALC 63 10/04/2018 10:38 AM    Wt Readings from Last 3 Encounters:  02/05/18 255 lb (115.7 kg)  05/17/17 247 lb 6.4 oz (112.2 kg)  04/17/17 247 lb 1 oz (112.1 kg)     Exam:    Vital Signs: Vital signs may also be detailed in the HPI There were no vitals taken for this visit.  Wt Readings from Last 3 Encounters:  02/05/18 255 lb (115.7 kg)  05/17/17 247 lb 6.4 oz (112.2 kg)  04/17/17 247 lb 1 oz (112.1 kg)   Temp Readings from Last 3 Encounters:  10/04/18 98.4 F (36.9 C)  02/05/18 97.8 F (36.6 C) (Oral)  05/17/17 98.1 F (36.7 C) (Temporal)   BP Readings from Last 3 Encounters:   10/04/18 (!) 141/83  02/05/18 130/75  05/17/17 120/70   Pulse Readings from Last 3 Encounters:  10/04/18 64  02/05/18 68  05/17/17 (!) 58     Well nourished, well developed male in no acute distress. Constitutional:  oriented to person, place, and time. No distress.    ASSESSMENT & PLAN:    Problem List Items Addressed This Visit      Cardiology Problems   HTN (hypertension)   Hyperlipidemia    Other Visit Diagnoses    Coronary artery calcification seen on CAT scan    -  Primary   Shortness of breath       History of smoking         Shortness of breath Likely multifactorial including prior smoking history, obesity, deconditioning We did run stress test for similar symptoms  February 2019 with no ischemia. Symptoms sound about the same as before Now with more sweating, fatigue -May benefit from baseline PFTs, echocardiogram He will meet with primary care for repeat lab work first We did discuss other options if symptoms get worse including cardiac CTA/with contrast --Certainly needs to work on his conditioning, weight loss Unclear if symptoms exacerbated in the heat, humidity as he works outdoors  Fatigue Suggested we at some point check his testosterone level He is taking B12, could take vitamin D  Morbid obesity We have encouraged continued exercise, careful diet management in an effort to lose weight.   COVID-19 Education: The signs and symptoms of COVID-19 were discussed with the patient and how to seek care for testing (follow up with PCP or arrange E-visit).  The importance of social distancing was discussed today.  Patient Risk:   After full review of this patients clinical status, I feel that they are at least moderate risk at this time.  Time:   Today, I have spent 25 minutes with the patient with telehealth technology discussing the cardiac and medical problems/diagnoses detailed above   Additional 10 min spent reviewing the chart prior to patient visit  today   Medication Adjustments/Labs and Tests Ordered: Current medicines are reviewed at length with the patient today.  Concerns regarding medicines are outlined above.   Tests Ordered: No tests ordered   Medication Changes: No changes made   Disposition: Follow-up in 12 months   Signed, Julien Nordmann, MD  [facility] [address], [place], [place] [address]

## 2018-10-16 ENCOUNTER — Telehealth (INDEPENDENT_AMBULATORY_CARE_PROVIDER_SITE_OTHER): Payer: Medicare Other | Admitting: Cardiovascular Disease

## 2018-10-16 DIAGNOSIS — I10 Essential (primary) hypertension: Secondary | ICD-10-CM

## 2018-10-16 DIAGNOSIS — I2510 Atherosclerotic heart disease of native coronary artery without angina pectoris: Secondary | ICD-10-CM

## 2018-10-16 DIAGNOSIS — R0602 Shortness of breath: Secondary | ICD-10-CM | POA: Diagnosis not present

## 2018-10-16 DIAGNOSIS — E782 Mixed hyperlipidemia: Secondary | ICD-10-CM | POA: Diagnosis not present

## 2018-10-16 DIAGNOSIS — Z87891 Personal history of nicotine dependence: Secondary | ICD-10-CM

## 2018-10-16 NOTE — Patient Instructions (Addendum)
Medication Instructions:  No changes  If you need a refill on your cardiac medications before your next appointment, please call your pharmacy.    Lab work: No new labs needed   If you have labs (blood work) drawn today and your tests are completely normal, you will receive your results only by: . MyChart Message (if you have MyChart) OR . A paper copy in the mail If you have any lab test that is abnormal or we need to change your treatment, we will call you to review the results.   Testing/Procedures: No new testing needed   Follow-Up: At [facility], you and your health needs are our priority.  As part of our continuing mission to provide you with exceptional heart care, we have created designated Provider Care Teams.  These Care Teams include your primary Cardiologist (physician) and Advanced Practice Providers (APPs -  Physician Assistants and Nurse Practitioners) who all work together to provide you with the care you need, when you need it.  . You will need a follow up appointment in 6 months (late March/ early April) .   Please call our office 2 months in advance to schedule this appointment.  (Call in early January to try to schedule)  . Providers on your designated Care Team:   . Christopher Berge, NP . Ryan Dunn, PA-C . Jacquelyn Visser, PA-C  Any Other Special Instructions Will Be Listed Below (If Applicable).  For educational health videos Log in to : [email or web address] Or : [email or web address], password : [email or web address]   

## 2018-10-18 ENCOUNTER — Other Ambulatory Visit: Payer: Self-pay | Admitting: Family Medicine

## 2018-11-07 ENCOUNTER — Other Ambulatory Visit: Payer: Medicare Other

## 2018-11-07 ENCOUNTER — Other Ambulatory Visit: Payer: Self-pay

## 2018-11-07 ENCOUNTER — Encounter: Payer: Self-pay | Admitting: Family Medicine

## 2018-11-07 ENCOUNTER — Ambulatory Visit (INDEPENDENT_AMBULATORY_CARE_PROVIDER_SITE_OTHER): Payer: Medicare Other | Admitting: Family Medicine

## 2018-11-07 VITALS — BP 119/75 | HR 76 | Temp 98.7°F

## 2018-11-07 DIAGNOSIS — R7989 Other specified abnormal findings of blood chemistry: Secondary | ICD-10-CM | POA: Diagnosis not present

## 2018-11-07 DIAGNOSIS — M25472 Effusion, left ankle: Secondary | ICD-10-CM

## 2018-11-07 DIAGNOSIS — E039 Hypothyroidism, unspecified: Secondary | ICD-10-CM | POA: Diagnosis not present

## 2018-11-07 MED ORDER — SULFAMETHOXAZOLE-TRIMETHOPRIM 800-160 MG PO TABS: 1.0000 | ORAL_TABLET | Freq: Two times a day (BID) | ORAL | 0 refills | Status: DC

## 2018-11-07 NOTE — Progress Notes (Signed)
   BP 119/75   Pulse 76   Temp 98.7 F (37.1 C)   SpO2 96%    Subjective:    Patient ID: Kenneth Johnston, male    DOB: 12/27/1950, 68 y.o.   MRN: [id number]  HPI: Kenneth Johnston is a 68 y.o. male  Chief Complaint  Patient presents with  . Ankle Pain    Patient complains of swelling and pain in his left ankle,. started in his toes about a week ago and has now moved to his ankle. Redness also   Presenting today with about a week of what started as toe swelling and tenderness and now has moved into left ankle. Having redness and swelling spreading up the left ankle the past few days that is tender. Did have some swelling of the other foot and ankle last week. No known injury, recent travel or sedentary behaviors, hx of blood clots, CP, SOB, fevers, drainage from area. Has not been trying anything aside from ice OTC for relief.   Relevant past medical, surgical, family and social history reviewed and updated as indicated. Interim medical history since our last visit reviewed. Allergies and medications reviewed and updated.  Review of Systems  Per HPI unless specifically indicated above     Objective:    BP 119/75   Pulse 76   Temp 98.7 F (37.1 C)   SpO2 96%   Wt Readings from Last 3 Encounters:  02/05/18 255 lb (115.7 kg)  05/17/17 247 lb 6.4 oz (112.2 kg)  04/17/17 247 lb 1 oz (112.1 kg)    Physical Exam Vitals signs and nursing note reviewed.  Constitutional:      Appearance: Normal appearance.  HENT:     Head: Atraumatic.  Eyes:     Extraocular Movements: Extraocular movements intact.     Conjunctiva/sclera: Conjunctivae normal.  Neck:     Musculoskeletal: Normal range of motion and neck supple.  Cardiovascular:     Rate and Rhythm: Normal rate and regular rhythm.  Pulmonary:     Effort: Pulmonary effort is normal.     Breath sounds: Normal breath sounds.  Musculoskeletal: Normal range of motion.        General: Swelling (mild left ankle and foot swelling)  present.  Skin:    General: Skin is warm and dry.     Findings: Erythema (2-3 inch erythematous area of medial left ankle, ttp) present.  Neurological:     General: No focal deficit present.     Mental Status: He is oriented to person, place, and time.  Psychiatric:        Mood and Affect: Mood normal.        Thought Content: Thought content normal.        Judgment: Judgment normal.     Results for orders placed or performed in visit on 11/07/18  TSH  Result Value Ref Range   TSH 7.090 (H) 0.450 - 4.500 uIU/mL      Assessment & Plan:   Problem List Items Addressed This Visit    None    Visit Diagnoses    Left ankle swelling    -  Primary   Suspect some cellulitis to the area. Will tx with bactrim, epsom salt soaks, leg elevation. F/u if not improving or worsening       Follow up plan: Return if symptoms worsen or fail to improve.

## 2018-11-08 LAB — TSH: TSH: 7.09 u[IU]/mL — ABNORMAL HIGH (ref 0.450–4.500)

## 2018-11-10 ENCOUNTER — Telehealth: Payer: Self-pay | Admitting: Family Medicine

## 2018-11-10 DIAGNOSIS — E039 Hypothyroidism, unspecified: Secondary | ICD-10-CM

## 2018-11-10 NOTE — Telephone Encounter (Signed)
Please let him know that his thyroid got a little worse, so I'd like to start him on some thyroid medicine. If he's OK with that, let me know and I'll send it to his pharmacy and we'll recheck his levels in 6 weeks.

## 2018-11-11 DIAGNOSIS — E039 Hypothyroidism, unspecified: Secondary | ICD-10-CM | POA: Insufficient documentation

## 2018-11-11 MED ORDER — LEVOTHYROXINE SODIUM 25 MCG PO TABS: 25.0000 ug | ORAL_TABLET | Freq: Every day | ORAL | 2 refills | Status: DC

## 2018-11-11 NOTE — Telephone Encounter (Signed)
Patient notified of labs. [place] with starting medicine, lab appointment scheduled for early December. Routing back to provider to send in RX.  Patient states that he saw Kenneth Johnston last week about his ankle and is being treated for cellulitis. Patient states he was told to call back today to let her know how he was doing. Patient states he is still having ankle swelling and pain and that it is not much better since he started the antibiotic on Thursday.

## 2018-11-13 NOTE — Telephone Encounter (Signed)
Pt calling to find out why he hasn't heard anything back about what he needs to do about his cellulitis. Pt is concerned because swelling is still there as is pain and no better on medication.

## 2018-11-14 NOTE — Telephone Encounter (Signed)
If he's no better with the antibiotic- it may not be a cellulitis. He likely needs to be seen

## 2018-11-14 NOTE — Telephone Encounter (Signed)
Appt scheduled

## 2018-11-15 ENCOUNTER — Encounter: Payer: Self-pay | Admitting: Family Medicine

## 2018-11-15 ENCOUNTER — Other Ambulatory Visit: Payer: Self-pay

## 2018-11-15 ENCOUNTER — Ambulatory Visit (INDEPENDENT_AMBULATORY_CARE_PROVIDER_SITE_OTHER): Payer: Medicare Other | Admitting: Nurse Practitioner

## 2018-11-15 VITALS — BP 139/80 | HR 76 | Temp 97.7°F

## 2018-11-15 DIAGNOSIS — L03116 Cellulitis of left lower limb: Secondary | ICD-10-CM | POA: Insufficient documentation

## 2018-11-15 MED ORDER — DOXYCYCLINE HYCLATE 100 MG PO TABS: 100.0000 mg | ORAL_TABLET | Freq: Two times a day (BID) | ORAL | 0 refills | Status: DC

## 2018-11-15 MED ORDER — PREDNISONE 10 MG PO TABS: 30.0000 mg | ORAL_TABLET | Freq: Every day | ORAL | 0 refills | Status: AC

## 2018-11-15 NOTE — Assessment & Plan Note (Signed)
Completed treatment with Bactrim with slight improvement reported.  Will switch to Doxycycline and add on short burst Prednisone. Recommend monitoring area and if worsening warmth, pain, or erythema over weekend immediately go to ER or UC.  Return in one week for follow-up.

## 2018-11-15 NOTE — Progress Notes (Signed)
BP 139/80 (BP Location: Left Arm, Cuff Size: Normal)   Pulse 76   Temp 97.7 F (36.5 C) (Oral)   SpO2 94%    Subjective:    Patient ID: Kenneth Johnston, male    DOB: 07-Nov-1950, 68 y.o.   MRN: [id number]  HPI: Kenneth Johnston is a 68 y.o. male  Chief Complaint  Patient presents with  . Edema    pt states that his ankle and leg are a little better, but still painful and swelling   CELLULITIS LEFT FOOT: Was treated with Bactrim last week, 11/07/18, which he finished up yesterday.  Today he presents for follow-up, reports 30% improved.  Initially started out in both feet, with L>R.  Reports right foot is 90% back to normal.  Denies recent trauma to area.  Has been resting and elevating. Duration: weeks Location: left foot History of trauma in area: no Pain: yes Quality: dull aching Severity: 4/10 Redness: yes Swelling: yes Oozing: no Pus: no Fevers: no Nausea/vomiting: no Status: better Treatments attempted:antibiotics  Tetanus: UTD  Relevant past medical, surgical, family and social history reviewed and updated as indicated. Interim medical history since our last visit reviewed. Allergies and medications reviewed and updated.  Review of Systems  Constitutional: Negative for activity change, diaphoresis, fatigue and fever.  Respiratory: Negative for cough, chest tightness, shortness of breath and wheezing.   Cardiovascular: Negative for chest pain, palpitations and leg swelling.  Gastrointestinal: Negative for abdominal distention, abdominal pain, constipation, diarrhea, nausea and vomiting.  Musculoskeletal: Positive for arthralgias.  Psychiatric/Behavioral: Negative.     Per HPI unless specifically indicated above     Objective:    BP 139/80 (BP Location: Left Arm, Cuff Size: Normal)   Pulse 76   Temp 97.7 F (36.5 C) (Oral)   SpO2 94%   Wt Readings from Last 3 Encounters:  02/05/18 255 lb (115.7 kg)  05/17/17 247 lb 6.4 oz (112.2 kg)  04/17/17 247 lb  1 oz (112.1 kg)    Physical Exam Vitals signs and nursing note reviewed.  Constitutional:      General: He is awake. He is not in acute distress.    Appearance: He is well-developed. He is not ill-appearing.  HENT:     Head: Normocephalic and atraumatic.     Right Ear: Hearing normal. No drainage.     Left Ear: Hearing normal. No drainage.  Eyes:     General: Lids are normal.        Right eye: No discharge.        Left eye: No discharge.     Conjunctiva/sclera: Conjunctivae normal.     Pupils: Pupils are equal, round, and reactive to light.  Neck:     Musculoskeletal: Normal range of motion and neck supple.     Vascular: No carotid bruit.  Cardiovascular:     Rate and Rhythm: Normal rate and regular rhythm.     Heart sounds: Normal heart sounds, S1 normal and S2 normal. No murmur. No gallop.   Pulmonary:     Effort: Pulmonary effort is normal. No accessory muscle usage or respiratory distress.     Breath sounds: Normal breath sounds.  Abdominal:     General: Bowel sounds are normal.     Palpations: Abdomen is soft.  Musculoskeletal: Normal range of motion.     Right ankle: He exhibits normal range of motion, no swelling, no ecchymosis, no laceration and normal pulse. No tenderness.     Left ankle: He  exhibits swelling (mild swelling to exterior ankle and top foot). He exhibits normal range of motion, no ecchymosis and normal pulse. Tenderness (mild). Achilles tendon exhibits no pain.     Right lower leg: No edema.     Left lower leg: No edema.     Comments: Mild erythema to medial left ankle.  Negative squeeze test and negative Homan's bilaterally. Warmth to medial left ankle and top of left foot.  Skin:    General: Skin is warm and dry.  Neurological:     Mental Status: He is alert and oriented to person, place, and time.     Deep Tendon Reflexes: Reflexes are normal and symmetric.  Psychiatric:        Mood and Affect: Mood normal.        Behavior: Behavior normal. Behavior  is cooperative.        Thought Content: Thought content normal.        Judgment: Judgment normal.    Results for orders placed or performed in visit on 11/07/18  TSH  Result Value Ref Range   TSH 7.090 (H) 0.450 - 4.500 uIU/mL      Assessment & Plan:   Problem List Items Addressed This Visit      Other   Cellulitis of left lower extremity - Primary    Completed treatment with Bactrim with slight improvement reported.  Will switch to Doxycycline and add on short burst Prednisone. Recommend monitoring area and if worsening warmth, pain, or erythema over weekend immediately go to ER or UC.  Return in one week for follow-up.      Relevant Orders   CBC with Differential/Platelet   Comprehensive metabolic panel   Uric acid       Follow up plan: Return in about 1 week (around 11/22/2018) for Cellulitis follow-up.

## 2018-11-15 NOTE — Patient Instructions (Signed)

## 2018-11-16 LAB — COMPREHENSIVE METABOLIC PANEL
ALT: 27 IU/L (ref 0–44)
AST: 25 IU/L (ref 0–40)
Albumin/Globulin Ratio: 1.7 (ref 1.2–2.2)
Albumin: 4.5 g/dL (ref 3.8–4.8)
Alkaline Phosphatase: 61 IU/L (ref 39–117)
BUN/Creatinine Ratio: 14 (ref 10–24)
BUN: 20 mg/dL (ref 8–27)
Bilirubin Total: 0.3 mg/dL (ref 0.0–1.2)
CO2: 23 mmol/L (ref 20–29)
Calcium: 10 mg/dL (ref 8.6–10.2)
Chloride: 103 mmol/L (ref 96–106)
Creatinine, Ser: 1.38 mg/dL — ABNORMAL HIGH (ref 0.76–1.27)
GFR calc Af Amer: 60 mL/min/{1.73_m2} (ref >59)
GFR calc non Af Amer: 52 mL/min/{1.73_m2} — ABNORMAL LOW (ref >59)
Globulin, Total: 2.7 g/dL (ref 1.5–4.5)
Glucose: 106 mg/dL — ABNORMAL HIGH (ref 65–99)
Potassium: 4.5 mmol/L (ref 3.5–5.2)
Sodium: 141 mmol/L (ref 134–144)
Total Protein: 7.2 g/dL (ref 6.0–8.5)

## 2018-11-16 LAB — URIC ACID: Uric Acid: 8.5 mg/dL (ref 3.7–8.6)

## 2018-11-16 LAB — CBC WITH DIFFERENTIAL/PLATELET
Basophils Absolute: 0.1 10*3/uL (ref 0.0–0.2)
Basos: 1 %
EOS (ABSOLUTE): 0.3 10*3/uL (ref 0.0–0.4)
Eos: 3 %
Hematocrit: 47.6 % (ref 37.5–51.0)
Hemoglobin: 15.8 g/dL (ref 13.0–17.7)
Immature Grans (Abs): 0 10*3/uL (ref 0.0–0.1)
Immature Granulocytes: 0 %
Lymphocytes Absolute: 1.8 10*3/uL (ref 0.7–3.1)
Lymphs: 20 %
MCH: 29.3 pg (ref 26.6–33.0)
MCHC: 33.2 g/dL (ref 31.5–35.7)
MCV: 88 fL (ref 79–97)
Monocytes Absolute: 0.7 10*3/uL (ref 0.1–0.9)
Monocytes: 8 %
Neutrophils Absolute: 6.1 10*3/uL (ref 1.4–7.0)
Neutrophils: 68 %
Platelets: 300 10*3/uL (ref 150–450)
RBC: 5.4 x10E6/uL (ref 4.14–5.80)
RDW: 13.2 % (ref 11.6–15.4)
WBC: 8.9 10*3/uL (ref 3.4–10.8)

## 2018-11-17 ENCOUNTER — Encounter: Payer: Self-pay | Admitting: Nurse Practitioner

## 2018-11-17 DIAGNOSIS — N1831 Chronic kidney disease, stage 3a: Secondary | ICD-10-CM | POA: Insufficient documentation

## 2018-11-22 ENCOUNTER — Other Ambulatory Visit: Payer: Self-pay

## 2018-11-22 ENCOUNTER — Ambulatory Visit (INDEPENDENT_AMBULATORY_CARE_PROVIDER_SITE_OTHER): Payer: Medicare Other | Admitting: Nurse Practitioner

## 2018-11-22 ENCOUNTER — Encounter: Payer: Self-pay | Admitting: Nurse Practitioner

## 2018-11-22 VITALS — BP 128/72 | HR 69 | Temp 98.1°F | Ht 75.0 in | Wt 246.0 lb

## 2018-11-22 DIAGNOSIS — E790 Hyperuricemia without signs of inflammatory arthritis and tophaceous disease: Secondary | ICD-10-CM | POA: Diagnosis not present

## 2018-11-22 DIAGNOSIS — L03116 Cellulitis of left lower limb: Secondary | ICD-10-CM

## 2018-11-22 NOTE — Assessment & Plan Note (Signed)
Improved, with no further issues.  Have recommended he continue abx until complete.  Suspect this was gout flare vs cellulitis based on elevation in uric acid and dx of CKD 3a.  Will recheck uric acid today and educated on diet to prevent gout.

## 2018-11-22 NOTE — Assessment & Plan Note (Signed)
Level 8.5 on recent labs after episode of pain and erythema in left ankle/foot.  Suspect gout flare in patient with CKD.  Will recheck uric acid level today and educated him on diet to prevent flares.

## 2018-11-22 NOTE — Progress Notes (Signed)
BP 128/72 (BP Location: Left Arm, Patient Position: Sitting)   Pulse 69   Temp 98.1 F (36.7 C) (Oral)   Ht 6\' 3"  (1.905 m)   Wt 246 lb (111.6 kg)   SpO2 97%   BMI 30.75 kg/m    Subjective:    Patient ID: Kenneth Johnston, male    DOB: [birth year], 68 y.o.   MRN: [id number]  HPI: Kenneth Johnston is a 68 y.o. male  Chief Complaint  Patient presents with  . Cellulitis    f/u   CELLULITIS LEFT FOOT: Was treated with Bactrim10/22/20, which he felt improved issue by 30%.  Initially started out in both feet, with L>R.  Reports both feet and ankles are now 100% better.  Denies recent trauma to area.  Has been resting and elevating.  Switched to Doxycycline and added on Prednisone at last visit on 11/15/18.  Of note uric acid on labs at that visit was 8.5 and he continues to have CKD 3a noted on labs.  Educated him at length on CKD and gout today. Duration: weeks Location: left foot History of trauma in area: no Pain: no Quality: no further pain Severity: 0/10 Redness: no Swelling: no Oozing: no Pus: no Fevers: no Nausea/vomiting: no Status: better Treatments attempted:antibiotics and Prednisone Tetanus: UTD  Relevant past medical, surgical, family and social history reviewed and updated as indicated. Interim medical history since our last visit reviewed. Allergies and medications reviewed and updated.  Review of Systems  Constitutional: Negative for activity change, diaphoresis, fatigue and fever.  Respiratory: Negative for cough, chest tightness, shortness of breath and wheezing.   Cardiovascular: Negative for chest pain, palpitations and leg swelling.  Gastrointestinal: Negative for abdominal distention, abdominal pain, constipation, diarrhea, nausea and vomiting.  Musculoskeletal: Negative for arthralgias.    Per HPI unless specifically indicated above     Objective:    BP 128/72 (BP Location: Left Arm, Patient Position: Sitting)   Pulse 69   Temp 98.1 F  (36.7 C) (Oral)   Ht 6\' 3"  (1.905 m)   Wt 246 lb (111.6 kg)   SpO2 97%   BMI 30.75 kg/m   Wt Readings from Last 3 Encounters:  11/22/18 246 lb (111.6 kg)  02/05/18 255 lb (115.7 kg)  05/17/17 247 lb 6.4 oz (112.2 kg)    Physical Exam Vitals signs and nursing note reviewed.  Constitutional:      General: He is awake. He is not in acute distress.    Appearance: He is well-developed. He is not ill-appearing.  HENT:     Head: Normocephalic and atraumatic.     Right Ear: Hearing normal. No drainage.     Left Ear: Hearing normal. No drainage.  Eyes:     General: Lids are normal.        Right eye: No discharge.        Left eye: No discharge.     Conjunctiva/sclera: Conjunctivae normal.     Pupils: Pupils are equal, round, and reactive to light.  Neck:     Musculoskeletal: Normal range of motion and neck supple.     Vascular: No carotid bruit.  Cardiovascular:     Rate and Rhythm: Normal rate and regular rhythm.     Heart sounds: Normal heart sounds, S1 normal and S2 normal. No murmur. No gallop.   Pulmonary:     Effort: Pulmonary effort is normal. No accessory muscle usage or respiratory distress.     Breath sounds: Normal breath sounds.  Abdominal:     General: Bowel sounds are normal.     Palpations: Abdomen is soft.  Musculoskeletal: Normal range of motion.     Right ankle: He exhibits normal range of motion, no swelling, no ecchymosis, no laceration and normal pulse. No tenderness.     Left ankle: He exhibits normal range of motion, no swelling, no ecchymosis and normal pulse. No tenderness. Achilles tendon exhibits no pain.     Right lower leg: No edema.     Left lower leg: No edema.     Comments: No further erythema or tenderness to left ankle or dorsal foot.  Skin:    General: Skin is warm and dry.  Neurological:     Mental Status: He is alert and oriented to person, place, and time.     Deep Tendon Reflexes: Reflexes are normal and symmetric.  Psychiatric:         Mood and Affect: Mood normal.        Behavior: Behavior normal. Behavior is cooperative.        Thought Content: Thought content normal.        Judgment: Judgment normal.     Results for orders placed or performed in visit on 11/15/18  CBC with Differential/Platelet  Result Value Ref Range   WBC 8.9 3.4 - 10.8 x10E3/uL   RBC 5.40 4.14 - 5.80 x10E6/uL   Hemoglobin 15.8 13.0 - 17.7 g/dL   Hematocrit 16.147.6 09.637.5 - 51.0 %   MCV 88 79 - 97 fL   MCH 29.3 26.6 - 33.0 pg   MCHC 33.2 31.5 - 35.7 g/dL   RDW 04.513.2 40.911.6 - 81.115.4 %   Platelets 300 150 - 450 x10E3/uL   Neutrophils 68 Not Estab. %   Lymphs 20 Not Estab. %   Monocytes 8 Not Estab. %   Eos 3 Not Estab. %   Basos 1 Not Estab. %   Neutrophils Absolute 6.1 1.4 - 7.0 x10E3/uL   Lymphocytes Absolute 1.8 0.7 - 3.1 x10E3/uL   Monocytes Absolute 0.7 0.1 - 0.9 x10E3/uL   EOS (ABSOLUTE) 0.3 0.0 - 0.4 x10E3/uL   Basophils Absolute 0.1 0.0 - 0.2 x10E3/uL   Immature Granulocytes 0 Not Estab. %   Immature Grans (Abs) 0.0 0.0 - 0.1 x10E3/uL  Comprehensive metabolic panel  Result Value Ref Range   Glucose 106 (H) 65 - 99 mg/dL   BUN 20 8 - 27 mg/dL   Creatinine, Ser 9.141.38 (H) 0.76 - 1.27 mg/dL   GFR calc non Af Amer 52 (L) >59 mL/min/1.73   GFR calc Af Amer 60 >59 mL/min/1.73   BUN/Creatinine Ratio 14 10 - 24   Sodium 141 134 - 144 mmol/L   Potassium 4.5 3.5 - 5.2 mmol/L   Chloride 103 96 - 106 mmol/L   CO2 23 20 - 29 mmol/L   Calcium 10.0 8.6 - 10.2 mg/dL   Total Protein 7.2 6.0 - 8.5 g/dL   Albumin 4.5 3.8 - 4.8 g/dL   Globulin, Total 2.7 1.5 - 4.5 g/dL   Albumin/Globulin Ratio 1.7 1.2 - 2.2   Bilirubin Total 0.3 0.0 - 1.2 mg/dL   Alkaline Phosphatase 61 39 - 117 IU/L   AST 25 0 - 40 IU/L   ALT 27 0 - 44 IU/L  Uric acid  Result Value Ref Range   Uric Acid 8.5 3.7 - 8.6 mg/dL      Assessment & Plan:   Problem List Items Addressed This Visit  Other   Cellulitis of left lower extremity - Primary    Improved, with no  further issues.  Have recommended he continue abx until complete.  Suspect this was gout flare vs cellulitis based on elevation in uric acid and dx of CKD 3a.  Will recheck uric acid today and educated on diet to prevent gout.      Elevated uric acid in blood    Level 8.5 on recent labs after episode of pain and erythema in left ankle/foot.  Suspect gout flare in patient with CKD.  Will recheck uric acid level today and educated him on diet to prevent flares.      Relevant Orders   Uric acid       Follow up plan: Return if symptoms worsen or fail to improve.

## 2018-11-22 NOTE — Patient Instructions (Signed)

## 2018-11-23 LAB — URIC ACID: Uric Acid: 7.7 mg/dL (ref 3.7–8.6)

## 2018-11-24 ENCOUNTER — Other Ambulatory Visit: Payer: Self-pay | Admitting: Nurse Practitioner

## 2018-11-24 DIAGNOSIS — E790 Hyperuricemia without signs of inflammatory arthritis and tophaceous disease: Secondary | ICD-10-CM

## 2018-12-16 ENCOUNTER — Other Ambulatory Visit: Payer: Self-pay | Admitting: Family Medicine

## 2018-12-16 ENCOUNTER — Telehealth: Payer: Self-pay

## 2018-12-16 NOTE — Telephone Encounter (Signed)
Patient did not request refill

## 2018-12-16 NOTE — Telephone Encounter (Signed)
Requested medication (s) are due for refill today: no  Requested medication (s) are on the active medication list: yes  Last refill:  10/08/2018  Future visit scheduled: 10/08/2018  Notes to clinic: refill cannot be delegated    Requested Prescriptions  Pending Prescriptions Disp Refills   nitrofurantoin, macrocrystal-monohydrate, (MACROBID) 100 MG capsule [Pharmacy Med Name: NITROFURANTOIN MONO/MAC 100MG CAPS] 14 capsule 0    Sig: TAKE 1 CAPSULE(100 MG) BY MOUTH TWICE DAILY     Off-Protocol Failed - 12/16/2018  3:18 AM      Failed - Medication not assigned to a protocol, review manually.      Passed - Valid encounter within last 12 months    Recent Outpatient Visits          3 weeks ago Cellulitis of left lower extremity   [place], [place] T, NP   1 month ago Cellulitis of left lower extremity   [place], Henrine Screws T, NP   1 month ago Left ankle swelling   [facility] Mer[place] Roof [place], [place]   2 months ago Encounter for [employer] annual wellness exam   [facility] [place], Megan P, DO   10 months ago Essential hypertension   [place], [place], DO      Future Appointments            In 3 months Johnson, Megan P, DO [employer], [facility]           Signed Prescriptions Disp Refills   lisinopril (ZESTRIL) 10 MG tablet 90 tablet 1    Sig: TAKE 1 TABLET(10 MG) BY MOUTH DAILY     Cardiovascular:  ACE Inhibitors Failed - 12/16/2018  3:18 AM      Failed - Cr in normal range and within 180 days    Creatinine, Ser  Date Value Ref Range Status  11/15/2018 1.38 (H) 0.76 - 1.27 mg/dL Final         Passed - K in normal range and within 180 days    Potassium  Date Value Ref Range Status  11/15/2018 4.5 3.5 - 5.2 mmol/L Final         Passed - Patient is not pregnant      Passed - Last BP in normal range    BP Readings from Last 1 Encounters:  11/22/18 128/72         Passed - Valid encounter within last 6 months    Recent Outpatient Visits          3 weeks ago Cellulitis of left lower extremity   [place], Henrine Screws T, NP   1 month ago Cellulitis of left lower extremity   [place], Henrine Screws T, NP   1 month ago Left ankle swelling   [place], [place], [place]   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

## 2018-12-16 NOTE — Telephone Encounter (Signed)
Patient is requesting a refill on the following: Sulfameth/trimeth 800/160 Prednisone doxycycline

## 2018-12-16 NOTE — Telephone Encounter (Signed)
All of those are antibiotics/steroids. They are not appropriate. Will need appointment for refills on those.

## 2018-12-23 ENCOUNTER — Other Ambulatory Visit: Payer: Medicare Other

## 2018-12-23 ENCOUNTER — Other Ambulatory Visit: Payer: Self-pay

## 2018-12-23 DIAGNOSIS — E039 Hypothyroidism, unspecified: Secondary | ICD-10-CM | POA: Diagnosis not present

## 2018-12-24 ENCOUNTER — Telehealth: Payer: Self-pay | Admitting: Family Medicine

## 2018-12-24 DIAGNOSIS — E039 Hypothyroidism, unspecified: Secondary | ICD-10-CM

## 2018-12-24 LAB — TSH: TSH: 5.24 u[IU]/mL — ABNORMAL HIGH (ref 0.450–4.500)

## 2018-12-24 MED ORDER — LEVOTHYROXINE SODIUM 50 MCG PO TABS: 50.0000 ug | ORAL_TABLET | Freq: Every day | ORAL | 2 refills | Status: DC

## 2018-12-24 NOTE — Telephone Encounter (Signed)
Please let him know that his thyroid is better, but still undertreated. I'm going to have him increase to 66mcg and recheck 6 weeks. Orders in.

## 2018-12-24 NOTE — Telephone Encounter (Signed)
Called patient to discuss lab results, no answer, left a voicemail for patient to return my call.  ?  ? ? ?

## 2018-12-25 NOTE — Telephone Encounter (Signed)
Patient notified

## 2019-01-19 ENCOUNTER — Other Ambulatory Visit: Payer: Self-pay | Admitting: Family Medicine

## 2019-01-21 ENCOUNTER — Encounter: Payer: Self-pay | Admitting: Family Medicine

## 2019-01-21 ENCOUNTER — Other Ambulatory Visit: Payer: Self-pay | Admitting: Family Medicine

## 2019-01-21 NOTE — Telephone Encounter (Signed)
Requested medication (s) are due for refill today: no  Requested medication (s) are on the active medication list: no  Last refill:  11/07/2018  Future visit scheduled: yes  Notes to clinic:  no assigned protocol    Requested Prescriptions  Pending Prescriptions Disp Refills   sulfamethoxazole-trimethoprim (BACTRIM DS) 800-160 MG tablet [Pharmacy Med Name: SULFAMETH/TRIMETHOPRIM 800/160MG  TB] 14 tablet 0    Sig: TAKE 1 TABLET BY MOUTH TWICE DAILY      Off-Protocol Failed - 01/21/2019  3:19 AM      Failed - Medication not assigned to a protocol, review manually.      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

## 2019-01-21 NOTE — Telephone Encounter (Signed)
LVM for pt to call back to schedule an appt. Letter sent through [place] and printed to mail.

## 2019-01-22 ENCOUNTER — Other Ambulatory Visit: Payer: Self-pay | Admitting: Family Medicine

## 2019-01-23 ENCOUNTER — Telehealth: Payer: Self-pay | Admitting: Family Medicine

## 2019-01-23 NOTE — Chronic Care Management (AMB) (Signed)
  Chronic Care Management   Note  01/23/2019 Name: JAJUAN SKOOG MRN: [id number] DOB: December 10, 1950  AAIDYN SAN is a 69 y.o. year old male who is a primary care patient of Valerie Roys, DO. I reached out to [employer] by phone today in response to a referral sent by Mr. Joyice Faster Schindel's health plan.     Mr. Escher was given information about Chronic Care Management services today including:  1. CCM service includes personalized support from designated clinical staff supervised by his physician, including individualized plan of care and coordination with other care providers 2. 24/7 contact phone numbers for assistance for urgent and routine care needs. 3. Service will only be billed when office clinical staff spend 20 minutes or more in a month to coordinate care. 4. Only one practitioner may furnish and bill the service in a calendar month. 5. The patient may stop CCM services at any time (effective at the end of the month) by phone call to the office staff. 6. The patient will be responsible for cost sharing (co-pay) of up to 20% of the service fee (after annual deductible is met).  Patient agreed to services and verbal consent obtained.   Follow up plan: Telephone appointment with care management team member scheduled for: 03/04/2019  [place], [place] [facility]  [place], [place] [address] Direct Dial: [email or web address]  Website: [email or web address]

## 2019-02-07 ENCOUNTER — Other Ambulatory Visit: Payer: Medicare Other

## 2019-02-07 ENCOUNTER — Other Ambulatory Visit: Payer: Self-pay

## 2019-02-07 DIAGNOSIS — E039 Hypothyroidism, unspecified: Secondary | ICD-10-CM

## 2019-02-07 DIAGNOSIS — E790 Hyperuricemia without signs of inflammatory arthritis and tophaceous disease: Secondary | ICD-10-CM

## 2019-02-08 LAB — URIC ACID: Uric Acid: 7.4 mg/dL (ref 3.8–8.4)

## 2019-02-08 LAB — TSH: TSH: 4.82 u[IU]/mL — ABNORMAL HIGH (ref 0.450–4.500)

## 2019-02-09 NOTE — Progress Notes (Signed)
Contacted via MyChart

## 2019-02-14 ENCOUNTER — Telehealth: Payer: Self-pay | Admitting: Family Medicine

## 2019-02-14 DIAGNOSIS — E039 Hypothyroidism, unspecified: Secondary | ICD-10-CM

## 2019-02-14 MED ORDER — LEVOTHYROXINE SODIUM 75 MCG PO TABS: 75.0000 ug | ORAL_TABLET | Freq: Every day | ORAL | 2 refills | Status: DC

## 2019-02-14 NOTE — Telephone Encounter (Signed)
Please let him know that his thyroid is better, but not quite there, i'd like him to increase to and we'll recheck 6 weeks. Order in.

## 2019-02-14 NOTE — Telephone Encounter (Signed)
Patient notified.  Lab appt scheduled.

## 2019-02-14 NOTE — Telephone Encounter (Signed)
Called patient to discuss lab results, no answer, left a voicemail for patient to return my call.  ?  ? ? ?

## 2019-02-21 ENCOUNTER — Other Ambulatory Visit: Payer: Self-pay | Admitting: Family Medicine

## 2019-02-21 NOTE — Telephone Encounter (Signed)
LOV 11/22/2018 Next appt 04/03/2019

## 2019-03-04 ENCOUNTER — Ambulatory Visit: Payer: Self-pay | Admitting: General Practice

## 2019-03-04 ENCOUNTER — Telehealth: Payer: Medicare Other

## 2019-03-04 NOTE — Chronic Care Management (AMB) (Signed)
  Chronic Care Management   Outreach Note  03/04/2019 Name: Kenneth Johnston MRN: [id number] DOB: 1950/10/20  Referred by: [name], DO Reason for referral : Chronic Care Management (Initial outreach: HTN/CKD3/HLD)   An unsuccessful telephone outreach was attempted today. The patient was referred to the case management team for assistance with care management and care coordination.   Follow Up Plan: A HIPPA compliant phone message was left for the patient providing contact information and requesting a return call.   Alto Denver RN, MSN, CCM Community Care Coordinator [place]  [facility] Mobile: [phone]

## 2019-03-23 ENCOUNTER — Other Ambulatory Visit: Payer: Self-pay | Admitting: Family Medicine

## 2019-03-23 NOTE — Telephone Encounter (Signed)
Requested medication (s) are due for refill today: yes  Requested medication (s) are on the active medication list: no  Last refill:  12/24/18  Future visit scheduled: yes  Notes to clinic:  due for OV and repeat TSH- both are scheduled Prescription has expired 02/14/19   Requested Prescriptions  Pending Prescriptions Disp Refills   levothyroxine (SYNTHROID) 50 MCG tablet [Pharmacy Med Name: LEVOTHYROXINE 0.05MG  ( ) TAB] 30 tablet 2    Sig: TAKE 1 TABLET(50 MCG) BY MOUTH DAILY BEFORE BREAKFAST      Endocrinology:  Hypothyroid Agents Failed - 03/23/2019  7:39 AM      Failed - TSH needs to be rechecked within 3 months after an abnormal result. Refill until TSH is due.      Failed - TSH in normal range and within 360 days    TSH  Date Value Ref Range Status  02/07/2019 4.820 (H) 0.450 - 4.500 uIU/mL Final          Passed - Valid encounter within last 12 months    Recent Outpatient Visits           4 months ago Cellulitis of left lower extremity   [facility] [place], [place] T, NP   4 months ago Cellulitis of left lower extremity   [facility] [place], Corrie Dandy T, NP   4 months ago Left ankle swelling   [facility] [place], [place], [place]   5 months ago Encounter for [employer] annual wellness exam   [facility] [place], [place], DO   1 year ago Essential hypertension   [facility] [place], [place], DO       Future Appointments             In 1 week Laural Benes, Oralia Rud, DO [employer]

## 2019-03-24 NOTE — Telephone Encounter (Signed)
LOV: 12/02/2018. NOV: 3/18/202.   Spoke with pt he stated he has enough medication to last until his appt 04/03/2019.

## 2019-03-24 NOTE — Telephone Encounter (Signed)
LOV 11/22/18

## 2019-03-28 ENCOUNTER — Other Ambulatory Visit: Payer: Medicare Other

## 2019-03-28 ENCOUNTER — Other Ambulatory Visit: Payer: Self-pay

## 2019-03-28 DIAGNOSIS — E039 Hypothyroidism, unspecified: Secondary | ICD-10-CM

## 2019-03-29 LAB — TSH: TSH: 2.36 u[IU]/mL (ref 0.450–4.500)

## 2019-04-01 ENCOUNTER — Other Ambulatory Visit: Payer: Self-pay | Admitting: Family Medicine

## 2019-04-01 MED ORDER — LEVOTHYROXINE SODIUM 75 MCG PO TABS: 75.0000 ug | ORAL_TABLET | Freq: Every day | ORAL | 3 refills | Status: DC

## 2019-04-03 ENCOUNTER — Encounter: Payer: Self-pay | Admitting: Family Medicine

## 2019-04-03 ENCOUNTER — Other Ambulatory Visit: Payer: Self-pay

## 2019-04-03 ENCOUNTER — Ambulatory Visit (INDEPENDENT_AMBULATORY_CARE_PROVIDER_SITE_OTHER): Payer: Medicare Other | Admitting: Family Medicine

## 2019-04-03 VITALS — BP 133/77 | HR 73 | Temp 98.3°F

## 2019-04-03 DIAGNOSIS — E782 Mixed hyperlipidemia: Secondary | ICD-10-CM

## 2019-04-03 DIAGNOSIS — I10 Essential (primary) hypertension: Secondary | ICD-10-CM

## 2019-04-03 DIAGNOSIS — E039 Hypothyroidism, unspecified: Secondary | ICD-10-CM | POA: Diagnosis not present

## 2019-04-03 DIAGNOSIS — E790 Hyperuricemia without signs of inflammatory arthritis and tophaceous disease: Secondary | ICD-10-CM

## 2019-04-03 DIAGNOSIS — N1831 Chronic kidney disease, stage 3a: Secondary | ICD-10-CM

## 2019-04-03 LAB — MICROALBUMIN, URINE WAIVED
Creatinine, Urine Waived: 300 mg/dL (ref 10–300)
Microalb, Ur Waived: 80 mg/L — ABNORMAL HIGH (ref 0–19)

## 2019-04-03 MED ORDER — AMLODIPINE BESYLATE 10 MG PO TABS: ORAL_TABLET | ORAL | 1 refills | Status: DC

## 2019-04-03 MED ORDER — ROSUVASTATIN CALCIUM 5 MG PO TABS: 5.0000 mg | ORAL_TABLET | Freq: Every day | ORAL | 1 refills | Status: DC

## 2019-04-03 MED ORDER — LISINOPRIL 10 MG PO TABS: ORAL_TABLET | ORAL | 1 refills | Status: DC

## 2019-04-03 NOTE — Assessment & Plan Note (Signed)
Under good control on current regimen. Continue current regimen. Continue to monitor. Call with any concerns. Refills up to date.   

## 2019-04-03 NOTE — Assessment & Plan Note (Signed)
Labs drawn today. Await results. Call with any concerns.  

## 2019-04-03 NOTE — Progress Notes (Signed)
BP 133/77   Pulse 73   Temp 98.3 F (36.8 C)   SpO2 95%    Subjective:    Patient ID: Kenneth Johnston, male    DOB: 08-29-1950, 69 y.o.   MRN: [id number]  HPI: Kenneth Johnston is a 69 y.o. male  Chief Complaint  Patient presents with  . Hyperlipidemia  . Hypertension   HYPERTENSION / HYPERLIPIDEMIA Satisfied with current treatment? yes Duration of hypertension: chronic BP monitoring frequency: not checking BP medication side effects: no Past BP meds: lisinopril, amlodipine Duration of hyperlipidemia: chronic Cholesterol medication side effects: no Cholesterol supplements: none Past cholesterol medications: crestor Medication compliance: excellent compliance Aspirin: no Recent stressors: no Recurrent headaches: no Visual changes: no Palpitations: no Dyspnea: no Chest pain: no Lower extremity edema: no Dizzy/lightheaded: no  HYPOTHYROIDISM Thyroid control status:better Satisfied with current treatment? yes Medication side effects: no Medication compliance: excellent compliance  Recent dose adjustment:no Fatigue: no Cold intolerance: no Heat intolerance: no Weight gain: no Weight loss: no Constipation: no Diarrhea/loose stools: no Palpitations: no Lower extremity edema: no Anxiety/depressed mood: no   Gout- no flares. Feeling well.   Relevant past medical, surgical, family and social history reviewed and updated as indicated. Interim medical history since our last visit reviewed. Allergies and medications reviewed and updated.  Review of Systems  Constitutional: Negative.   Respiratory: Negative.   Cardiovascular: Negative.   Gastrointestinal: Negative.   Musculoskeletal: Negative.   Neurological: Negative.   Psychiatric/Behavioral: Negative.     Per HPI unless specifically indicated above     Objective:    BP 133/77   Pulse 73   Temp 98.3 F (36.8 C)   SpO2 95%   Wt Readings from Last 3 Encounters:  11/22/18 246 lb (111.6 kg)   02/05/18 255 lb (115.7 kg)  05/17/17 247 lb 6.4 oz (112.2 kg)    Physical Exam Vitals and nursing note reviewed.  Constitutional:      General: He is not in acute distress.    Appearance: Normal appearance. He is not ill-appearing, toxic-appearing or diaphoretic.  HENT:     Head: Normocephalic and atraumatic.     Right Ear: External ear normal.     Left Ear: External ear normal.     Nose: Nose normal.     Mouth/Throat:     Mouth: Mucous membranes are moist.     Pharynx: Oropharynx is clear.  Eyes:     General: No scleral icterus.       Right eye: No discharge.        Left eye: No discharge.     Extraocular Movements: Extraocular movements intact.     Conjunctiva/sclera: Conjunctivae normal.     Pupils: Pupils are equal, round, and reactive to light.  Cardiovascular:     Rate and Rhythm: Normal rate and regular rhythm.     Pulses: Normal pulses.     Heart sounds: Normal heart sounds. No murmur. No friction rub. No gallop.   Pulmonary:     Effort: Pulmonary effort is normal. No respiratory distress.     Breath sounds: Normal breath sounds. No stridor. No wheezing, rhonchi or rales.  Chest:     Chest wall: No tenderness.  Musculoskeletal:        General: Normal range of motion.     Cervical back: Normal range of motion and neck supple.  Skin:    General: Skin is warm and dry.     Capillary Refill: Capillary refill takes less than  2 seconds.     Coloration: Skin is not jaundiced or pale.     Findings: No bruising, erythema, lesion or rash.  Neurological:     General: No focal deficit present.     Mental Status: He is alert and oriented to person, place, and time. Mental status is at baseline.  Psychiatric:        Mood and Affect: Mood normal.        Behavior: Behavior normal.        Thought Content: Thought content normal.        Judgment: Judgment normal.     Results for orders placed or performed in visit on 04/03/19  Microalbumin, Urine Waived  Result Value Ref  Range   Microalb, Ur Waived 80 (H) 0 - 19 mg/L   Creatinine, Urine Waived 300 10 - 300 mg/dL   Microalb/Creat Ratio 30-300 (H) <30 mg/g      Assessment & Plan:   Problem List Items Addressed This Visit      Cardiovascular and Mediastinum   HTN (hypertension) - Primary    Under good control on current regimen. Continue current regimen. Continue to monitor. Call with any concerns. Refills given. Labs drawn today.       Relevant Medications   rosuvastatin (CRESTOR) 5 MG tablet   lisinopril (ZESTRIL) 10 MG tablet   amLODipine (NORVASC) 10 MG tablet   Other Relevant Orders   CBC with Differential/Platelet   Comprehensive metabolic panel   Microalbumin, Urine Waived (Completed)     Endocrine   Hypothyroidism    Under good control on current regimen. Continue current regimen. Continue to monitor. Call with any concerns. Refills up to date.      Relevant Orders   CBC with Differential/Platelet   Comprehensive metabolic panel     Genitourinary   Chronic kidney disease, stage 3a    Labs drawn today. Await results. Call with any concerns.       Relevant Orders   CBC with Differential/Platelet   Comprehensive metabolic panel     Other   Hyperlipidemia    Under good control on current regimen. Continue current regimen. Continue to monitor. Call with any concerns. Refills given. Labs drawn today.      Relevant Medications   rosuvastatin (CRESTOR) 5 MG tablet   lisinopril (ZESTRIL) 10 MG tablet   amLODipine (NORVASC) 10 MG tablet   Other Relevant Orders   CBC with Differential/Platelet   Comprehensive metabolic panel   Lipid Panel w/o Chol/HDL Ratio   Elevated uric acid in blood    Labs drawn today. Await results.       Relevant Orders   CBC with Differential/Platelet   Comprehensive metabolic panel   Uric acid       Follow up plan: Return in about 6 months (around 10/04/2019) for Physical.

## 2019-04-03 NOTE — Patient Instructions (Signed)
We are recommending the vaccine to everyone who has not had an allergic reaction to any of the components of the vaccine. If you have specific questions about the vaccine, please bring them up with your health care provider to discuss them.   We will likely not be getting the vaccine in the office for the first rounds of vaccinations. The way they are releasing the vaccines is going to be through the health systems (like [facility], [facility], [facility], [facility]), through your county health department, or through the pharmacies.   The [place] [facility] is giving vaccines to those 65+ and Health Care Workers Teachers and Child Care providers start 03/12/19, Essential workers start 3/10 and those with co-morbidities start 04/09/19 Call [phone] to schedule  If you are 65+ you can get a vaccine through [facility] by signing up for an appointment.  You can sign up by going to: [email or web address].  You can get more information by going to: [email or web address]://[email or web address].ncdhhs.gov/vaccines  [facility] next door is giving the Moderna Vaccine- you can call [phone] or stop by there to schedule.  

## 2019-04-03 NOTE — Assessment & Plan Note (Signed)
Under good control on current regimen. Continue current regimen. Continue to monitor. Call with any concerns. Refills given. Labs drawn today.   

## 2019-04-03 NOTE — Assessment & Plan Note (Signed)
Labs drawn today. Await results.  

## 2019-04-04 LAB — CBC WITH DIFFERENTIAL/PLATELET
Basophils Absolute: 0.1 10*3/uL (ref 0.0–0.2)
Basos: 1 %
EOS (ABSOLUTE): 0.4 10*3/uL (ref 0.0–0.4)
Eos: 5 %
Hematocrit: 49.2 % (ref 37.5–51.0)
Hemoglobin: 16.4 g/dL (ref 13.0–17.7)
Immature Grans (Abs): 0 10*3/uL (ref 0.0–0.1)
Immature Granulocytes: 0 %
Lymphocytes Absolute: 1.6 10*3/uL (ref 0.7–3.1)
Lymphs: 21 %
MCH: 29 pg (ref 26.6–33.0)
MCHC: 33.3 g/dL (ref 31.5–35.7)
MCV: 87 fL (ref 79–97)
Monocytes Absolute: 0.6 10*3/uL (ref 0.1–0.9)
Monocytes: 9 %
Neutrophils Absolute: 4.8 10*3/uL (ref 1.4–7.0)
Neutrophils: 64 %
Platelets: 227 10*3/uL (ref 150–450)
RBC: 5.66 x10E6/uL (ref 4.14–5.80)
RDW: 13.3 % (ref 11.6–15.4)
WBC: 7.5 10*3/uL (ref 3.4–10.8)

## 2019-04-04 LAB — COMPREHENSIVE METABOLIC PANEL
ALT: 29 IU/L (ref 0–44)
AST: 26 IU/L (ref 0–40)
Albumin/Globulin Ratio: 1.6 (ref 1.2–2.2)
Albumin: 4.2 g/dL (ref 3.8–4.8)
Alkaline Phosphatase: 57 IU/L (ref 39–117)
BUN/Creatinine Ratio: 14 (ref 10–24)
BUN: 18 mg/dL (ref 8–27)
Bilirubin Total: 0.4 mg/dL (ref 0.0–1.2)
CO2: 21 mmol/L (ref 20–29)
Calcium: 9.6 mg/dL (ref 8.6–10.2)
Chloride: 105 mmol/L (ref 96–106)
Creatinine, Ser: 1.29 mg/dL — ABNORMAL HIGH (ref 0.76–1.27)
GFR calc Af Amer: 65 mL/min/{1.73_m2} (ref >59)
GFR calc non Af Amer: 56 mL/min/{1.73_m2} — ABNORMAL LOW (ref >59)
Globulin, Total: 2.6 g/dL (ref 1.5–4.5)
Glucose: 142 mg/dL — ABNORMAL HIGH (ref 65–99)
Potassium: 4.2 mmol/L (ref 3.5–5.2)
Sodium: 142 mmol/L (ref 134–144)
Total Protein: 6.8 g/dL (ref 6.0–8.5)

## 2019-04-04 LAB — LIPID PANEL W/O CHOL/HDL RATIO
Cholesterol, Total: 120 mg/dL (ref 100–199)
HDL: 27 mg/dL — ABNORMAL LOW (ref >39)
LDL Chol Calc (NIH): 54 mg/dL (ref 0–99)
Triglycerides: 245 mg/dL — ABNORMAL HIGH (ref 0–149)
VLDL Cholesterol Cal: 39 mg/dL (ref 5–40)

## 2019-04-04 LAB — URIC ACID: Uric Acid: 8.3 mg/dL (ref 3.8–8.4)

## 2019-04-14 DIAGNOSIS — M62830 Muscle spasm of back: Secondary | ICD-10-CM | POA: Diagnosis not present

## 2019-04-14 DIAGNOSIS — M545 Low back pain: Secondary | ICD-10-CM | POA: Diagnosis not present

## 2019-04-14 DIAGNOSIS — M9903 Segmental and somatic dysfunction of lumbar region: Secondary | ICD-10-CM | POA: Diagnosis not present

## 2019-04-25 ENCOUNTER — Ambulatory Visit: Payer: Self-pay | Admitting: General Practice

## 2019-04-25 ENCOUNTER — Telehealth: Payer: Medicare Other

## 2019-04-25 NOTE — Chronic Care Management (AMB) (Signed)
°  Chronic Care Management   Outreach Note  04/25/2019 Name: Kenneth Johnston MRN: [id number] DOB: [birth year]  Referred by: [name], DO Reason for referral : Chronic Care Management (Initial- 2nd attempt)   A second unsuccessful telephone outreach was attempted today. The patient was referred to the case management team for assistance with care management and care coordination.   Follow Up Plan: A HIPPA compliant phone message was left for the patient providing contact information and requesting a return call.   Alto Denver RN, MSN, CCM Community Care Coordinator [place]   [facility] Mobile: [phone]

## 2019-04-28 ENCOUNTER — Encounter: Payer: Self-pay | Admitting: Physician Assistant

## 2019-04-28 NOTE — Progress Notes (Signed)
Office Visit    Patient Name: Kenneth Johnston Date of Encounter: 05/02/2019  Primary Care Provider:  [name], DO Primary Cardiologist:  Julien Nordmann, MD Electrophysiologist:  None   Chief Complaint    Kenneth Johnston is a 69 y.o. male with a hx of coronary artery calcium on CT imaging, CKDIII, HTN, HLD, hypothyroidism, prior tobacco use, obesity, chronic back pain presents today for 6 month follow up of coronary artery calcium and HTN.    Past Medical History    Past Medical History:  Diagnosis Date  . Allergy   . Arthritis   . CKD (chronic kidney disease), stage III   . Coronary artery calcification   . Hyperlipidemia   . Hypertension   . Hypothyroidism   . Umbilical hernia 03/04/2015   Past Surgical History:  Procedure Laterality Date  . KNEE CARTILAGE SURGERY Bilateral     Allergies  No Known Allergies  History of Present Illness    Kenneth Johnston is a 69 y.o. male with a hx of coronary artery calcium on CT, CKD III, HTN, HLD, hypothyroidism, prior tobacco use, obesity, chronic back pain. He was last seen by Dr. Mariah Milling 10/16/18 by Dr. Mariah Milling via telemedicine.  Coronary artery calcium score January 2019 of 582 which was 79th percentile for age and sex matched control. Lexiscan MPI February 2019 negative for ischemia or scar with LVEF >65%. Poor exercise tolerance required the study to be transitioned from treadmill to pharmacologic after 3 min 37 sec. Frequent PVC and brief run SVT noted.   Seen virtually 09/2018 via telemedicine noting sweating and fatigue. Symptoms considered multifactorial with testing to be considered at follow up.   Feels much better after getting treatment for his hypothyroidism. Attributes most of his previous episodes of fatigue, diaphoresis.   Plays golf and works in the garden for exercise. No SOB nor DOE. No chest pain, pressure, tightness.   Has made recent dietary changes with his wife. Eating fewer carbohydrates and  sweets.   EKGs/Labs/Other Studies Reviewed:   The following studies were reviewed today:  Calcium score 01/2017: Coronary calcium score of 582 . This was 35 th percentile for age and sex matched control. __________  Eugenie Birks MPI 02/2017:  Normal pharmacologic myocardial perfusion without ischemia or scar.  The left ventricular ejection fraction is hyperdynamic (>65%).  This is a low risk study.  Poor excercise capacity; study was converted from exercise due to fatigue after 3:37 minutes.  Frequent PVCs and brief run of SVT noted during exercise. Correlate clinically.  EKG:  EKG is ordered today.  The ekg ordered today demonstrates SR no acute ST/T wave changes.   Recent Labs: 03/28/2019: TSH 2.360 04/03/2019: ALT 29; BUN 18; Creatinine, Ser 1.29; Hemoglobin 16.4; Platelets 227; Potassium 4.2; Sodium 142  Recent Lipid Panel    Component Value Date/Time   CHOL 120 04/03/2019 1033   TRIG 245 (H) 04/03/2019 1033   HDL 27 (L) 04/03/2019 1033   LDLCALC 54 04/03/2019 1033    Home Medications   Current Meds  Medication Sig  . amLODipine (NORVASC) 10 MG tablet TAKE 1 TABLET(10 MG) BY MOUTH DAILY  . cyclobenzaprine (FLEXERIL) 10 MG tablet TAKE 1 TABLET(10 MG) BY MOUTH AT BEDTIME AS NEEDED  . levothyroxine (SYNTHROID) 75 MCG tablet Take 1 tablet (75 mcg total) by mouth daily before breakfast.  . lisinopril (ZESTRIL) 10 MG tablet TAKE 1 TABLET(10 MG) BY MOUTH DAILY  . meloxicam (MOBIC) 15 MG tablet TAKE 1  TABLET(15 MG) BY MOUTH DAILY  . rosuvastatin (CRESTOR) 5 MG tablet Take 1 tablet (5 mg total) by mouth daily at 6 PM.     Review of Systems       Review of Systems  Constitution: Negative for chills, fever and malaise/fatigue.  Cardiovascular: Negative for chest pain, dyspnea on exertion, leg swelling, near-syncope, orthopnea, palpitations and syncope.  Respiratory: Negative for cough, shortness of breath and wheezing.   Gastrointestinal: Negative for nausea and vomiting.    Neurological: Negative for dizziness, light-headedness and weakness.   All other systems reviewed and are otherwise negative except as noted above.  Physical Exam    VS:  BP 124/70 (BP Location: Left Arm, Patient Position: Sitting, Cuff Size: Normal)   Pulse 76   Ht 6\' 3"  (1.905 m)   Wt 244 lb 4 oz (110.8 kg)   SpO2 97%   BMI 30.53 kg/m  , BMI Body mass index is 30.53 kg/m. GEN: Well nourished, well developed, in no acute distress. HEENT: normal. Neck: Supple, no JVD, carotid bruits, or masses. Cardiac: =RRR, no murmurs, rubs, or gallops. No clubbing, cyanosis, edema.  =Radials/DP/PT 2+ and equal bilaterally.  Respiratory:  Respirations regular and unlabored, clear to auscultation bilaterally. GI: Soft, nontender, nondistended, BS + x 4. MS: No deformity or atrophy. Skin: Warm and dry, no rash. Neuro:  Strength and sensation are intact. Psych: Normal affect.  Accessory Clinical Findings    ECG personally reviewed by me today - SR no acute ST/T wave changes.  - no acute changes.  Assessment & Plan    1. Coronary artery calcification on CT - EKG today with no ST/T wave changes. Stable with no anginal symptoms. No indication for ischemic evaluation at this time. Recommend he start Aspirin EC 81mg  daily. Continue statin. Defer addition of beta blocker at this time. Recommend low sodium, heart healthy diet and regular cardiovascular exercise.   2. HTN - BP well controlled. Continue Lisinopril 10mg  daily and Amlodipine 10mg  daily.    3. HLD - Continue Crestor 5mg  daily. Lipid panel 04/03/19 total cholesterol 120, HDL 27, triglycerides 245, LDL 54. Recommend continued dietary changes for reduction of triglycerides.   4. Hypothyroidism - Follows with primary care.   Disposition: Follow up in 1 year(s) with Dr. Rockey Situ or APP.   Loel Dubonnet, NP 05/02/2019, 11:27 AM

## 2019-05-02 ENCOUNTER — Other Ambulatory Visit: Payer: Self-pay

## 2019-05-02 ENCOUNTER — Encounter: Payer: Self-pay | Admitting: Family

## 2019-05-02 ENCOUNTER — Ambulatory Visit (INDEPENDENT_AMBULATORY_CARE_PROVIDER_SITE_OTHER): Payer: Medicare Other | Admitting: Family

## 2019-05-02 VITALS — BP 124/70 | HR 76 | Ht 75.0 in | Wt 244.2 lb

## 2019-05-02 DIAGNOSIS — I2510 Atherosclerotic heart disease of native coronary artery without angina pectoris: Secondary | ICD-10-CM | POA: Diagnosis not present

## 2019-05-02 DIAGNOSIS — E782 Mixed hyperlipidemia: Secondary | ICD-10-CM | POA: Diagnosis not present

## 2019-05-02 DIAGNOSIS — I10 Essential (primary) hypertension: Secondary | ICD-10-CM | POA: Diagnosis not present

## 2019-05-02 MED ORDER — ASPIRIN EC 81 MG PO TBEC: 81.0000 mg | DELAYED_RELEASE_TABLET | Freq: Every day | ORAL | 3 refills | Status: DC

## 2019-05-02 NOTE — Patient Instructions (Addendum)
Medication Instructions:  Your physician has recommended you make the following change in your medication:   Start Aspirin EC 81mg  daily  *If you need a refill on your cardiac medications before your next appointment, please call your pharmacy*   Lab Work: None ordered today.  If you have labs (blood work) drawn today and your tests are completely normal, you will receive your results only by: MyChart Message (if you have MyChart) OR . A paper copy in the mail If you have any lab test that is abnormal or we need to change your treatment, we will call you to review the results.   Testing/Procedures: Your EKG today showed normal sinus rhythm which is a good result!  Follow-Up: At [facility], you and your health needs are our priority.  As part of our continuing mission to provide you with exceptional heart care, we have created designated Provider Care Teams.  These Care Teams include your primary Cardiologist (physician) and Advanced Practice Providers (APPs -  Physician Assistants and Nurse Practitioners) who all work together to provide you with the care you need, when you need it.  We recommend signing up for the patient portal called "MyChart".  Sign up information is provided on this After Visit Summary.  MyChart is used to connect with patients for Virtual Visits (Telemedicine).  Patients are able to view lab/test results, encounter notes, upcoming appointments, etc.  Non-urgent messages can be sent to your provider as well.   To learn more about what you can do with MyChart, go to [facility].    Your next appointment: In 1 year with [email or web address]

## 2019-05-23 ENCOUNTER — Other Ambulatory Visit: Payer: Self-pay | Admitting: Family Medicine

## 2019-05-30 ENCOUNTER — Ambulatory Visit: Payer: Self-pay | Admitting: General Practice

## 2019-05-30 ENCOUNTER — Telehealth: Payer: Medicare Other

## 2019-05-30 NOTE — Chronic Care Management (AMB) (Signed)
  Chronic Care Management   Outreach Note  05/30/2019 Name: Kenneth Johnston MRN: [id number] DOB: 07/13/1950  Referred by: [name], DO Reason for referral : Chronic Care Management (Initial outreach call: 3rd attempt)   Third unsuccessful telephone outreach was attempted today. The patient was referred to the case management team for assistance with care management and care coordination. The patient's primary care provider has been notified of our unsuccessful attempts to make or maintain contact with the patient. The care management team is pleased to engage with this patient at any time in the future should he/she be interested in assistance from the care management team.   Follow Up Plan: The care management team is available to follow up with the patient after provider conversation with the patient regarding recommendation for care management engagement and subsequent re-referral to the care management team.   Alto Denver RN, MSN, CCM Community Care Coordinator [place]  [facility] Mobile: [phone]

## 2019-06-23 ENCOUNTER — Other Ambulatory Visit: Payer: Self-pay | Admitting: Family Medicine

## 2019-09-24 ENCOUNTER — Telehealth: Payer: Self-pay | Admitting: Family Medicine

## 2019-09-24 NOTE — Telephone Encounter (Signed)
Called pt because he scheduled an appt to get a virus shot via mychart, not sure if he is talking about the covid vaccine. Called pt to get clarity, no answer, left vm to call back

## 2019-09-25 NOTE — Telephone Encounter (Signed)
Spoke with pt he was talking about the covid vaccine, advised we currently don't issue the vaccine in our office, he states that he will go to the pharmacy to get it

## 2019-10-05 ENCOUNTER — Other Ambulatory Visit: Payer: Self-pay | Admitting: Family Medicine

## 2019-10-05 NOTE — Telephone Encounter (Signed)
Requested medication (s) are due for refill today: yes  Requested medication (s) are on the active medication list: yes   Future visit scheduled: yes   Notes to clinic:  Patient has upcoming appointment on 10/09/2019   Requested Prescriptions  Pending Prescriptions Disp Refills   amLODipine (NORVASC) 10 MG tablet [Pharmacy Med Name: AMLODIPINE BESYLATE 10MG  TABLETS] 90 tablet 1    Sig: TAKE 1 TABLET(10 MG) BY MOUTH DAILY      Cardiovascular:  Calcium Channel Blockers Failed - 10/05/2019  9:59 AM      Failed - Valid encounter within last 6 months    Recent Outpatient Visits           6 months ago Essential hypertension   [facility] [place], Megan P, DO   10 months ago Cellulitis of left lower extremity   [facility] [place], Jolene T, NP   10 months ago Cellulitis of left lower extremity   [facility] [place], [place] T, NP   11 months ago Left ankle swelling   [facility] [place], [place], Salley Hews   1 year ago Encounter for [place] annual wellness exam   [facility] [place], [place], DO       Future Appointments             In 4 days [place], Megan P, DO [birth year]-1998, [facility]   In 1 week  [employer], [facility]            Passed - Last BP in normal range    BP Readings from Last 1 Encounters:  05/02/19 124/70

## 2019-10-06 NOTE — Telephone Encounter (Signed)
Routing to provider  

## 2019-10-09 ENCOUNTER — Ambulatory Visit: Payer: Medicare Other | Admitting: Family Medicine

## 2019-10-09 ENCOUNTER — Ambulatory Visit (INDEPENDENT_AMBULATORY_CARE_PROVIDER_SITE_OTHER): Payer: Medicare Other | Admitting: Family Medicine

## 2019-10-09 ENCOUNTER — Encounter: Payer: Self-pay | Admitting: Family Medicine

## 2019-10-09 ENCOUNTER — Other Ambulatory Visit: Payer: Self-pay

## 2019-10-09 VITALS — BP 132/72 | HR 76 | Temp 98.3°F | Ht 72.0 in | Wt 237.0 lb

## 2019-10-09 DIAGNOSIS — E039 Hypothyroidism, unspecified: Secondary | ICD-10-CM | POA: Diagnosis not present

## 2019-10-09 DIAGNOSIS — R8281 Pyuria: Secondary | ICD-10-CM | POA: Diagnosis not present

## 2019-10-09 DIAGNOSIS — E782 Mixed hyperlipidemia: Secondary | ICD-10-CM | POA: Diagnosis not present

## 2019-10-09 DIAGNOSIS — I10 Essential (primary) hypertension: Secondary | ICD-10-CM | POA: Diagnosis not present

## 2019-10-09 DIAGNOSIS — E790 Hyperuricemia without signs of inflammatory arthritis and tophaceous disease: Secondary | ICD-10-CM | POA: Diagnosis not present

## 2019-10-09 DIAGNOSIS — D692 Other nonthrombocytopenic purpura: Secondary | ICD-10-CM

## 2019-10-09 DIAGNOSIS — N1831 Chronic kidney disease, stage 3a: Secondary | ICD-10-CM | POA: Diagnosis not present

## 2019-10-09 DIAGNOSIS — Z0000 Encounter for general adult medical examination without abnormal findings: Secondary | ICD-10-CM

## 2019-10-09 DIAGNOSIS — Z125 Encounter for screening for malignant neoplasm of prostate: Secondary | ICD-10-CM

## 2019-10-09 LAB — URINALYSIS, ROUTINE W REFLEX MICROSCOPIC
Bilirubin, UA: NEGATIVE
Glucose, UA: NEGATIVE
Ketones, UA: NEGATIVE
Nitrite, UA: POSITIVE — AB
Protein,UA: NEGATIVE
Specific Gravity, UA: 1.025 (ref 1.005–1.030)
Urobilinogen, Ur: 0.2 mg/dL (ref 0.2–1.0)
pH, UA: 5 (ref 5.0–7.5)

## 2019-10-09 LAB — MICROALBUMIN, URINE WAIVED
Creatinine, Urine Waived: 200 mg/dL (ref 10–300)
Microalb, Ur Waived: 80 mg/L — ABNORMAL HIGH (ref 0–19)

## 2019-10-09 LAB — MICROSCOPIC EXAMINATION

## 2019-10-09 MED ORDER — MELOXICAM 15 MG PO TABS
15.0000 mg | ORAL_TABLET | Freq: Every day | ORAL | 1 refills | Status: DC
Start: 2019-10-09 — End: 2019-12-31

## 2019-10-09 MED ORDER — LISINOPRIL 10 MG PO TABS
10.0000 mg | ORAL_TABLET | Freq: Every day | ORAL | 1 refills | Status: DC
Start: 2019-10-09 — End: 2020-04-07

## 2019-10-09 MED ORDER — CYCLOBENZAPRINE HCL 10 MG PO TABS
ORAL_TABLET | ORAL | 6 refills | Status: DC
Start: 2019-10-09 — End: 2020-06-03

## 2019-10-09 MED ORDER — ROSUVASTATIN CALCIUM 5 MG PO TABS
5.0000 mg | ORAL_TABLET | Freq: Every day | ORAL | 1 refills | Status: DC
Start: 2019-10-09 — End: 2020-04-07

## 2019-10-09 MED ORDER — AMLODIPINE BESYLATE 10 MG PO TABS
ORAL_TABLET | ORAL | 1 refills | Status: DC
Start: 2019-10-09 — End: 2020-04-07

## 2019-10-09 MED ORDER — ASPIRIN EC 81 MG PO TBEC
81.0000 mg | DELAYED_RELEASE_TABLET | Freq: Every day | ORAL | 3 refills | Status: DC
Start: 2019-10-09 — End: 2020-10-11

## 2019-10-09 NOTE — Assessment & Plan Note (Signed)
Rechecking levels today. Await results. Treat as needed. Call with any concerns.  

## 2019-10-09 NOTE — Assessment & Plan Note (Signed)
Rechecking levels today. Await results. Treat as needed.  

## 2019-10-09 NOTE — Progress Notes (Signed)
BP 132/72   Pulse 76   Temp 98.3 F (36.8 C) (Oral)   Ht 6' (1.829 m)   Wt 237 lb (107.5 kg)   SpO2 97%   BMI 32.14 kg/m    Subjective:    Patient ID: Kenneth Johnston, male    DOB: 1950/02/18, 69 y.o.   MRN: [id number]  HPI: Kenneth Johnston is a 69 y.o. male presenting on 10/09/2019 for comprehensive medical examination. Current medical complaints include:  HYPERTENSION / HYPERLIPIDEMIA Satisfied with current treatment? yes Duration of hypertension: chronic BP monitoring frequency: not checking BP medication side effects: no Past BP meds: lisinopril, amlodipine Duration of hyperlipidemia: chronic Cholesterol medication side effects: no Cholesterol supplements: none Past cholesterol medications: crestor Medication compliance: excellent compliance Aspirin: yes Recent stressors: no Recurrent headaches: no Visual changes: no Palpitations: no Dyspnea: no Chest pain: no Lower extremity edema: no Dizzy/lightheaded: no  HYPOTHYROIDISM Thyroid control status:controlled Satisfied with current treatment? no Medication side effects: no Medication compliance: excellent compliance Recent dose adjustment:no Fatigue: no Cold intolerance: no Heat intolerance: no Weight gain: no Weight loss: no Constipation: no Diarrhea/loose stools: no Palpitations: no Lower extremity edema: no Anxiety/depressed mood: no  Interim Problems from his last visit: no  Depression Screen done today and results listed below:  Depression screen [facility] 2/9 10/09/2019 10/04/2018 05/17/2017 05/16/2016 04/30/2015  Decreased Interest 0 0 0 0 0  Down, Depressed, Hopeless 0 0 0 0 0  PHQ - 2 Score 0 0 0 0 0  Altered sleeping 0 - - - -  Tired, decreased energy 0 - - - -  Change in appetite 0 - - - -  Feeling bad or failure about yourself  0 - - - -  Trouble concentrating 0 - - - -  Moving slowly or fidgety/restless 0 - - - -  Suicidal thoughts 0 - - - -  PHQ-9 Score 0 - - - -  Difficult doing work/chores  Not difficult at all - - - -     Past Medical History:  Past Medical History:  Diagnosis Date  . Allergy   . Arthritis   . CKD (chronic kidney disease), stage III   . Coronary artery calcification   . Hyperlipidemia   . Hypertension   . Hypothyroidism   . Umbilical hernia 03/04/2015    Surgical History:  Past Surgical History:  Procedure Laterality Date  . KNEE CARTILAGE SURGERY Bilateral     Medications:  Current Outpatient Medications on File Prior to Visit  Medication Sig  . levothyroxine (SYNTHROID) 75 MCG tablet TAKE 1 TABLET(75 MCG) BY MOUTH DAILY BEFORE BREAKFAST   No current facility-administered medications on file prior to visit.    Allergies:  No Known Allergies  Social History:  Social History   Socioeconomic History  . Marital status: Married    Spouse name: Not on file  . Number of children: Not on file  . Years of education: Not on file  . Highest education level: Not on file  Occupational History  . Not on file  Tobacco Use  . Smoking status: Former Smoker    Quit date: 03/04/1995    Years since quitting: 24.6  . Smokeless tobacco: Never Used  Vaping Use  . Vaping Use: Never used  Substance and Sexual Activity  . Alcohol use: Yes    Comment: very seldom   . Drug use: No  . Sexual activity: Yes  Other Topics Concern  . Not on file  Social History Narrative  [place]    Social Determinants of Health   Financial Resource Strain:   . Difficulty of Paying Living Expenses: Not on file  Food Insecurity:   . Worried About Programme researcher, broadcasting/film/video in the Last Year: Not on file  . Ran Out of Food in the Last Year: Not on file  Transportation Needs:   . Lack of Transportation (Medical): Not on file  . Lack of Transportation (Non-Medical): Not on file  Physical Activity:   . Days of Exercise per Week: Not on file  . Minutes of Exercise per Session: Not on file  Stress:   . Feeling of Stress : Not on file  Social Connections:   . Frequency of  Communication with Friends and Family: Not on file  . Frequency of Social Gatherings with Friends and Family: Not on file  . Attends Religious Services: Not on file  . Active Member of Clubs or Organizations: Not on file  . Attends Banker Meetings: Not on file  . Marital Status: Not on file  Intimate Partner Violence:   . Fear of Current or Ex-Partner: Not on file  . Emotionally Abused: Not on file  . Physically Abused: Not on file  . Sexually Abused: Not on file   Social History   Tobacco Use  Smoking Status Former Smoker  . Quit date: 03/04/1995  . Years since quitting: 24.6  Smokeless Tobacco Never Used   Social History   Substance and Sexual Activity  Alcohol Use Yes   Comment: very seldom     Family History:  Family History  Problem Relation Age of Onset  . Kidney failure Mother   . Aneurysm Father        brain  . Kidney disease Paternal Grandmother   . Heart attack Paternal Grandfather     Past medical history, surgical history, medications, allergies, family history and social history reviewed with patient today and changes made to appropriate areas of the chart.   Review of Systems  Constitutional: Negative.   HENT: Negative.   Eyes: Negative.   Respiratory: Negative.   Cardiovascular: Negative.   Gastrointestinal: Negative.   Genitourinary: Negative.   Musculoskeletal: Negative.   Skin: Negative.   Neurological: Negative.   Endo/Heme/Allergies: Negative for environmental allergies and polydipsia. Bruises/bleeds easily.  Psychiatric/Behavioral: Negative.     All other ROS negative except what is listed above and in the HPI.      Objective:    BP 132/72   Pulse 76   Temp 98.3 F (36.8 C) (Oral)   Ht 6' (1.829 m)   Wt 237 lb (107.5 kg)   SpO2 97%   BMI 32.14 kg/m   Wt Readings from Last 3 Encounters:  10/09/19 237 lb (107.5 kg)  05/02/19 244 lb 4 oz (110.8 kg)  11/22/18 246 lb (111.6 kg)    Physical Exam Vitals and nursing  note reviewed.  Constitutional:      General: He is not in acute distress.    Appearance: Normal appearance. He is obese. He is not ill-appearing, toxic-appearing or diaphoretic.  HENT:     Head: Normocephalic and atraumatic.     Right Ear: Tympanic membrane, ear canal and external ear normal. There is no impacted cerumen.     Left Ear: Tympanic membrane, ear canal and external ear normal. There is no impacted cerumen.     Nose: Nose normal. No congestion or rhinorrhea.     Mouth/Throat:     Mouth: Mucous membranes are  moist.     Pharynx: Oropharynx is clear. No oropharyngeal exudate or posterior oropharyngeal erythema.  Eyes:     General: No scleral icterus.       Right eye: No discharge.        Left eye: No discharge.     Extraocular Movements: Extraocular movements intact.     Conjunctiva/sclera: Conjunctivae normal.     Pupils: Pupils are equal, round, and reactive to light.  Neck:     Vascular: No carotid bruit.  Cardiovascular:     Rate and Rhythm: Normal rate and regular rhythm.     Pulses: Normal pulses.     Heart sounds: No murmur heard.  No friction rub. No gallop.   Pulmonary:     Effort: Pulmonary effort is normal. No respiratory distress.     Breath sounds: Normal breath sounds. No stridor. No wheezing, rhonchi or rales.  Chest:     Chest wall: No tenderness.  Abdominal:     General: Abdomen is flat. Bowel sounds are normal. There is no distension.     Palpations: Abdomen is soft. There is no mass.     Tenderness: There is no abdominal tenderness. There is no right CVA tenderness, left CVA tenderness, guarding or rebound.     Hernia: No hernia is present.  Genitourinary:    Comments: Genital exam deferred with shared decision making Musculoskeletal:        General: No swelling, tenderness, deformity or signs of injury.     Cervical back: Normal range of motion and neck supple. No rigidity. No muscular tenderness.     Right lower leg: No edema.     Left lower  leg: No edema.  Lymphadenopathy:     Cervical: No cervical adenopathy.  Skin:    General: Skin is warm and dry.     Capillary Refill: Capillary refill takes less than 2 seconds.     Coloration: Skin is not jaundiced or pale.     Findings: No bruising, erythema, lesion or rash.  Neurological:     General: No focal deficit present.     Mental Status: He is alert and oriented to person, place, and time.     Cranial Nerves: No cranial nerve deficit.     Sensory: No sensory deficit.     Motor: No weakness.     Coordination: Coordination normal.     Gait: Gait normal.     Deep Tendon Reflexes: Reflexes normal.  Psychiatric:        Mood and Affect: Mood normal.        Behavior: Behavior normal.        Thought Content: Thought content normal.        Judgment: Judgment normal.     Results for orders placed or performed in visit on 04/03/19  CBC with Differential/Platelet  Result Value Ref Range   WBC 7.5 3.4 - 10.8 x10E3/uL   RBC 5.66 4.14 - 5.80 x10E6/uL   Hemoglobin 16.4 13.0 - 17.7 g/dL   Hematocrit 16.149.2 09.637.5 - 51.0 %   MCV 87 79 - 97 fL   MCH 29.0 26.6 - 33.0 pg   MCHC 33.3 31 - 35 g/dL   RDW 04.513.3 40.911.6 - 81.115.4 %   Platelets 227 150 - 450 x10E3/uL   Neutrophils 64 Not Estab. %   Lymphs 21 Not Estab. %   Monocytes 9 Not Estab. %   Eos 5 Not Estab. %   Basos 1 Not Estab. %  Neutrophils Absolute 4.8 1 - 7 x10E3/uL   Lymphocytes Absolute 1.6 0 - 3 x10E3/uL   Monocytes Absolute 0.6 0 - 0 x10E3/uL   EOS (ABSOLUTE) 0.4 0.0 - 0.4 x10E3/uL   Basophils Absolute 0.1 0 - 0 x10E3/uL   Immature Granulocytes 0 Not Estab. %   Immature Grans (Abs) 0.0 0.0 - 0.1 x10E3/uL  Comprehensive metabolic panel  Result Value Ref Range   Glucose 142 (H) 65 - 99 mg/dL   BUN 18 8 - 27 mg/dL   Creatinine, Ser 8.29 (H) 0.76 - 1.27 mg/dL   GFR calc non Af Amer 56 (L) >59 mL/min/1.73   GFR calc Af Amer 65 >59 mL/min/1.73   BUN/Creatinine Ratio 14 10 - 24   Sodium 142 134 - 144 mmol/L   Potassium 4.2  3.5 - 5.2 mmol/L   Chloride 105 96 - 106 mmol/L   CO2 21 20 - 29 mmol/L   Calcium 9.6 8.6 - 10.2 mg/dL   Total Protein 6.8 6.0 - 8.5 g/dL   Albumin 4.2 3.8 - 4.8 g/dL   Globulin, Total 2.6 1.5 - 4.5 g/dL   Albumin/Globulin Ratio 1.6 1.2 - 2.2   Bilirubin Total 0.4 0.0 - 1.2 mg/dL   Alkaline Phosphatase 57 39 - 117 IU/L   AST 26 0 - 40 IU/L   ALT 29 0 - 44 IU/L  Lipid Panel w/o Chol/HDL Ratio  Result Value Ref Range   Cholesterol, Total 120 100 - 199 mg/dL   Triglycerides 562 (H) 0 - 149 mg/dL   HDL 27 (L) >13 mg/dL   VLDL Cholesterol Cal 39 5 - 40 mg/dL   LDL Chol Calc (NIH) 54 0 - 99 mg/dL  Microalbumin, Urine Waived  Result Value Ref Range   Microalb, Ur Waived 80 (H) 0 - 19 mg/L   Creatinine, Urine Waived 300 10 - 300 mg/dL   Microalb/Creat Ratio 30-300 (H) <30 mg/g  Uric acid  Result Value Ref Range   Uric Acid 8.3 3.8 - 8.4 mg/dL      Assessment & Plan:   Problem List Items Addressed This Visit      Cardiovascular and Mediastinum   HTN (hypertension)    Under good control on current regimen. Continue current regimen. Continue to monitor. Call with any concerns. Refills given. Labs drawn today.       Relevant Medications   amLODipine (NORVASC) 10 MG tablet   aspirin EC 81 MG tablet   lisinopril (ZESTRIL) 10 MG tablet   rosuvastatin (CRESTOR) 5 MG tablet   Other Relevant Orders   CBC with Differential/Platelet   Comprehensive metabolic panel   Microalbumin, Urine Waived   Senile purpura (HCC)    Reassured patient. Continue to monitor. Call with any concerns.       Relevant Medications   amLODipine (NORVASC) 10 MG tablet   aspirin EC 81 MG tablet   lisinopril (ZESTRIL) 10 MG tablet   rosuvastatin (CRESTOR) 5 MG tablet     Endocrine   Hypothyroidism    Rechecking levels today. Await results. Treat as needed. Call with any concerns.       Relevant Orders   CBC with Differential/Platelet   Comprehensive metabolic panel   TSH     Genitourinary    Chronic kidney disease, stage 3a    Rechecking levels today. Await results. Treat as needed.       Relevant Orders   CBC with Differential/Platelet   Comprehensive metabolic panel   Microalbumin, Urine Waived  Urinalysis, Routine w reflex microscopic     Other   Hyperlipidemia    Under good control on current regimen. Continue current regimen. Continue to monitor. Call with any concerns. Refills given. Labs drawn today.       Relevant Medications   amLODipine (NORVASC) 10 MG tablet   aspirin EC 81 MG tablet   lisinopril (ZESTRIL) 10 MG tablet   rosuvastatin (CRESTOR) 5 MG tablet   Other Relevant Orders   CBC with Differential/Platelet   Comprehensive metabolic panel   Lipid Panel w/o Chol/HDL Ratio   Elevated uric acid in blood    Rechecking levels today. Await results. Treat as needed.       Relevant Orders   CBC with Differential/Platelet   Comprehensive metabolic panel   Uric acid    Other Visit Diagnoses    Routine general medical examination at a health care facility    -  Primary   Vaccines decelined. Screening labs checked today. Colonoscopy up to date. Continue diet and exercise. Call with any concerns.    Screening for prostate cancer       Labs drawn today. Await results. Treat as neededed.   Relevant Orders   CBC with Differential/Platelet   Comprehensive metabolic panel   PSA   Pyuria       Will check urine culture. Await results. Treat as needed.    Relevant Orders   Urine Culture       Discussed aspirin prophylaxis for myocardial infarction prevention and decision was made to continue ASA  LABORATORY TESTING:  Health maintenance labs ordered today as discussed above.   The natural history of prostate cancer and ongoing controversy regarding screening and potential treatment outcomes of prostate cancer has been discussed with the patient. The meaning of a false positive PSA and a false negative PSA has been discussed. He indicates understanding of  the limitations of this screening test and wishes to proceed with screening PSA testing.   IMMUNIZATIONS:   - Tdap: Tetanus vaccination status reviewed: Refused. - Influenza: Refused - Pneumovax: Refused - Prevnar: Refused - COVID: in process  SCREENING: - Colonoscopy: Up to date  Discussed with patient purpose of the colonoscopy is to detect colon cancer at curable precancerous or early stages   PATIENT COUNSELING:    Sexuality: Discussed sexually transmitted diseases, partner selection, use of condoms, avoidance of unintended pregnancy  and contraceptive alternatives.   Advised to avoid cigarette smoking.  I discussed with the patient that most people either abstain from alcohol or drink within safe limits (<=14/week and <=4 drinks/occasion for males, <=7/weeks and <= 3 drinks/occasion for females) and that the risk for alcohol disorders and other health effects rises proportionally with the number of drinks per week and how often a drinker exceeds daily limits.  Discussed cessation/primary prevention of drug use and availability of treatment for abuse.   Diet: Encouraged to adjust caloric intake to maintain  or achieve ideal body weight, to reduce intake of dietary saturated fat and total fat, to limit sodium intake by avoiding high sodium foods and not adding table salt, and to maintain adequate dietary potassium and calcium preferably from fresh fruits, vegetables, and low-fat dairy products.    stressed the importance of regular exercise  Injury prevention: Discussed safety belts, safety helmets, smoke detector, smoking near bedding or upholstery.   Dental health: Discussed importance of regular tooth brushing, flossing, and dental visits.   Follow up plan: NEXT PREVENTATIVE PHYSICAL DUE IN 1 YEAR. Return in  about 6 months (around 04/07/2020).

## 2019-10-09 NOTE — Assessment & Plan Note (Signed)
Reassured patient. Continue to monitor. Call with any concerns.  

## 2019-10-09 NOTE — Assessment & Plan Note (Signed)
Under good control on current regimen. Continue current regimen. Continue to monitor. Call with any concerns. Refills given. Labs drawn today.   

## 2019-10-10 LAB — CBC WITH DIFFERENTIAL/PLATELET
Basophils Absolute: 0.1 10*3/uL (ref 0.0–0.2)
Basos: 1 %
EOS (ABSOLUTE): 0.2 10*3/uL (ref 0.0–0.4)
Eos: 3 %
Hematocrit: 48 % (ref 37.5–51.0)
Hemoglobin: 16.1 g/dL (ref 13.0–17.7)
Immature Grans (Abs): 0 10*3/uL (ref 0.0–0.1)
Immature Granulocytes: 0 %
Lymphocytes Absolute: 1.4 10*3/uL (ref 0.7–3.1)
Lymphs: 15 %
MCH: 29.2 pg (ref 26.6–33.0)
MCHC: 33.5 g/dL (ref 31.5–35.7)
MCV: 87 fL (ref 79–97)
Monocytes Absolute: 0.8 10*3/uL (ref 0.1–0.9)
Monocytes: 9 %
Neutrophils Absolute: 6.8 10*3/uL (ref 1.4–7.0)
Neutrophils: 72 %
Platelets: 213 10*3/uL (ref 150–450)
RBC: 5.52 x10E6/uL (ref 4.14–5.80)
RDW: 13.5 % (ref 11.6–15.4)
WBC: 9.4 10*3/uL (ref 3.4–10.8)

## 2019-10-10 LAB — URIC ACID: Uric Acid: 7.7 mg/dL (ref 3.8–8.4)

## 2019-10-10 LAB — LIPID PANEL W/O CHOL/HDL RATIO
Cholesterol, Total: 135 mg/dL (ref 100–199)
HDL: 31 mg/dL — ABNORMAL LOW (ref >39)
LDL Chol Calc (NIH): 68 mg/dL (ref 0–99)
Triglycerides: 216 mg/dL — ABNORMAL HIGH (ref 0–149)
VLDL Cholesterol Cal: 36 mg/dL (ref 5–40)

## 2019-10-10 LAB — COMPREHENSIVE METABOLIC PANEL
ALT: 24 IU/L (ref 0–44)
AST: 19 IU/L (ref 0–40)
Albumin/Globulin Ratio: 1.7 (ref 1.2–2.2)
Albumin: 4.5 g/dL (ref 3.8–4.8)
Alkaline Phosphatase: 57 IU/L (ref 44–121)
BUN/Creatinine Ratio: 15 (ref 10–24)
BUN: 20 mg/dL (ref 8–27)
Bilirubin Total: 0.4 mg/dL (ref 0.0–1.2)
CO2: 22 mmol/L (ref 20–29)
Calcium: 9.5 mg/dL (ref 8.6–10.2)
Chloride: 103 mmol/L (ref 96–106)
Creatinine, Ser: 1.3 mg/dL — ABNORMAL HIGH (ref 0.76–1.27)
GFR calc Af Amer: 64 mL/min/{1.73_m2} (ref >59)
GFR calc non Af Amer: 55 mL/min/{1.73_m2} — ABNORMAL LOW (ref >59)
Globulin, Total: 2.6 g/dL (ref 1.5–4.5)
Glucose: 119 mg/dL — ABNORMAL HIGH (ref 65–99)
Potassium: 4.2 mmol/L (ref 3.5–5.2)
Sodium: 139 mmol/L (ref 134–144)
Total Protein: 7.1 g/dL (ref 6.0–8.5)

## 2019-10-10 LAB — PSA: Prostate Specific Ag, Serum: 1.7 ng/mL (ref 0.0–4.0)

## 2019-10-10 LAB — TSH: TSH: 2.61 u[IU]/mL (ref 0.450–4.500)

## 2019-10-12 ENCOUNTER — Other Ambulatory Visit: Payer: Self-pay | Admitting: Family Medicine

## 2019-10-12 MED ORDER — CIPROFLOXACIN HCL 500 MG PO TABS: 500.0000 mg | ORAL_TABLET | Freq: Two times a day (BID) | ORAL | 0 refills | Status: DC

## 2019-10-13 ENCOUNTER — Other Ambulatory Visit: Payer: Self-pay | Admitting: Family Medicine

## 2019-10-13 ENCOUNTER — Ambulatory Visit (INDEPENDENT_AMBULATORY_CARE_PROVIDER_SITE_OTHER): Payer: Medicare Other

## 2019-10-13 VITALS — Ht 74.0 in | Wt 237.0 lb

## 2019-10-13 DIAGNOSIS — Z0000 Encounter for general adult medical examination without abnormal findings: Secondary | ICD-10-CM

## 2019-10-13 LAB — URINE CULTURE

## 2019-10-13 MED ORDER — SULFAMETHOXAZOLE-TRIMETHOPRIM 800-160 MG PO TABS: 1.0000 | ORAL_TABLET | Freq: Two times a day (BID) | ORAL | 0 refills | Status: DC

## 2019-10-13 NOTE — Progress Notes (Signed)
I connected with Kenneth Johnston today by telephone and verified that I am speaking with the correct person using two identifiers. Location patient: home Location provider: work Persons participating in the virtual visit: [name], [name].   I discussed the limitations, risks, security and privacy concerns of performing an evaluation and management service by telephone and the availability of in person appointments. I also discussed with the patient that there may be a patient responsible charge related to this service. The patient expressed understanding and verbally consented to this telephonic visit.    Interactive audio and video telecommunications were attempted between this provider and patient, however failed, due to patient having technical difficulties OR patient did not have access to video capability.  We continued and completed visit with audio only.     Vital signs may be patient reported or missing.  Subjective:   Kenneth Johnston is a 69 y.o. male who presents for Medicare Annual/Subsequent preventive examination.  Review of Systems     Cardiac Risk Factors include: advanced age (>53men, >29 women);dyslipidemia;male gender;obesity (BMI >30kg/m2)     Objective:    Today's Vitals   10/13/19 1426  Weight: 237 lb (107.5 kg)  Height: 6\' 2"  (1.88 m)   Body mass index is 30.43 kg/m.  Advanced Directives 10/13/2019 05/17/2017 05/16/2016  Does Patient Have a Medical Advance Directive? Yes Yes No  Type of 07/16/2016 of [place];Living will Living will;Healthcare Power of Attorney -  Copy of Healthcare Power of Attorney in Chart? No - copy requested No - copy requested -  Would patient like information on creating a medical advance directive? - - Yes (MAU/Ambulatory/Procedural Areas - Information given)    Current Medications (verified) Outpatient Encounter Medications as of 10/13/2019  Medication Sig  . amLODipine (NORVASC) 10 MG  tablet TAKE 1 TABLET(10 MG) BY MOUTH DAILY  . aspirin EC 81 MG tablet Take 1 tablet (81 mg total) by mouth daily.  . cyclobenzaprine (FLEXERIL) 10 MG tablet TAKE 1 TABLET(10 MG) BY MOUTH AT BEDTIME AS NEEDED  . levothyroxine (SYNTHROID) 75 MCG tablet TAKE 1 TABLET(75 MCG) BY MOUTH DAILY BEFORE BREAKFAST  . lisinopril (ZESTRIL) 10 MG tablet Take 1 tablet (10 mg total) by mouth daily.  . meloxicam (MOBIC) 15 MG tablet Take 1 tablet (15 mg total) by mouth daily.  . rosuvastatin (CRESTOR) 5 MG tablet Take 1 tablet (5 mg total) by mouth daily at 6 PM.  . sulfamethoxazole-trimethoprim (BACTRIM DS) 800-160 MG tablet Take 1 tablet by mouth 2 (two) times daily.  . [DISCONTINUED] ciprofloxacin (CIPRO) 500 MG tablet Take 1 tablet (500 mg total) by mouth 2 (two) times daily.   No facility-administered encounter medications on file as of 10/13/2019.    Allergies (verified) Patient has no known allergies.   History: Past Medical History:  Diagnosis Date  . Allergy   . Arthritis   . CKD (chronic kidney disease), stage III   . Coronary artery calcification   . Hyperlipidemia   . Hypertension   . Hypothyroidism   . Umbilical hernia 03/04/2015   Past Surgical History:  Procedure Laterality Date  . KNEE CARTILAGE SURGERY Bilateral    Family History  Problem Relation Age of Onset  . Kidney failure Mother   . Aneurysm Father        brain  . Kidney disease Paternal Grandmother   . Heart attack Paternal Grandfather    Social History   Socioeconomic History  . Marital status: Married  Spouse name: Not on file  . Number of children: Not on file  . Years of education: Not on file  . Highest education level: Not on file  Occupational History  . Not on file  Tobacco Use  . Smoking status: Former Smoker    Quit date: 03/04/1995    Years since quitting: 24.6  . Smokeless tobacco: Never Used  Vaping Use  . Vaping Use: Never used  Substance and Sexual Activity  . Alcohol use: Yes     Comment: very seldom   . Drug use: No  . Sexual activity: Yes  Other Topics Concern  . Not on file  Social History Narrative   [place]    Social Determinants of Health   Financial Resource Strain: Low Risk   . Difficulty of Paying Living Expenses: Not hard at all  Food Insecurity: No Food Insecurity  . Worried About Programme researcher, broadcasting/film/videounning Out of Food in the Last Year: Never true  . Ran Out of Food in the Last Year: Never true  Transportation Needs: No Transportation Needs  . Lack of Transportation (Medical): No  . Lack of Transportation (Non-Medical): No  Physical Activity: Sufficiently Active  . Days of Exercise per Week: 3 days  . Minutes of Exercise per Session: 60 min  Stress: No Stress Concern Present  . Feeling of Stress : Not at all  Social Connections:   . Frequency of Communication with Friends and Family: Not on file  . Frequency of Social Gatherings with Friends and Family: Not on file  . Attends Religious Services: Not on file  . Active Member of Clubs or Organizations: Not on file  . Attends BankerClub or Organization Meetings: Not on file  . Marital Status: Not on file    Tobacco Counseling Counseling given: Not Answered   Clinical Intake:  Pre-visit preparation completed: Yes  Pain : No/denies pain     Nutritional Status: BMI > 30  Obese Nutritional Risks: None Diabetes: No  How often do you need to have someone help you when you read instructions, pamphlets, or other written materials from your doctor or pharmacy?: 1 - Never What is the last grade level you completed in school?: 12th grade  Diabetic? no  Interpreter Needed?: No  Information entered by :: NAllen LPN   Activities of Daily Living In your present state of health, do you have any difficulty performing the following activities: 10/13/2019 10/09/2019  Hearing? N N  Vision? N N  Difficulty concentrating or making decisions? N N  Walking or climbing stairs? N N  Dressing or bathing? N N  Doing errands,  shopping? N N  Preparing Food and eating ? N -  Using the Toilet? N -  In the past six months, have you accidently leaked urine? N -  Do you have problems with loss of bowel control? N -  Managing your Medications? N -  Managing your Finances? N -  Housekeeping or managing your Housekeeping? N -  Some recent data might be hidden    Patient Care Team: Dorcas CarrowJohnson, Megan P, DO as PCP - General (Family Medicine) Mariah MillingGollan, Tollie Pizzaimothy J, MD as PCP - Cardiology (Cardiology) Marlowe Saxate, Pamela J, RN as Case Manager (General Practice)  Indicate any recent Medical Services you may have received from other than [facility] providers in the past year (date may be approximate).     Assessment:   This is a routine wellness examination for [facility].  Hearing/Vision screen  Hearing Screening   125Hz  250Hz  500Hz   1000Hz  2000Hz  3000Hz  4000Hz  6000Hz  8000Hz   Right ear:           Left ear:           Vision Screening Comments: Regular eye exams, Dr.  Dietary issues and exercise activities discussed: Current Exercise Habits: Home exercise routine, Type of exercise: walking, Time (Minutes): 60, Frequency (Times/Week): 3, Weekly Exercise (Minutes/Week): 180  Goals    . DIET - INCREASE WATER INTAKE     Recommend drinking at least 6-8 glasses of water a day     . Patient Stated     10/13/2019, wants to weigh 230 pounds      Depression Screen PHQ 2/9 Scores 10/13/2019 10/09/2019 10/04/2018 05/17/2017 05/16/2016 04/30/2015 03/18/2015  PHQ - 2 Score 0 0 0 0 0 0 0  PHQ- 9 Score - 0 - - - - -    Fall Risk Fall Risk  10/13/2019 10/09/2019 10/04/2018 02/05/2018 05/17/2017  Falls in the past year? 0 0 0 0 No  Number falls in past yr: - 0 0 - -  Injury with Fall? - - 0 - -  Risk for fall due to : Medication side effect - - - -  Follow up Falls evaluation completed;Education provided;Falls prevention discussed - - Falls evaluation completed -    Any stairs in or around the home? Yes  If so, are there any without handrails? No    Home free of loose throw rugs in walkways, pet beds, electrical cords, etc? Yes  Adequate lighting in your home to reduce risk of falls? Yes   ASSISTIVE DEVICES UTILIZED TO PREVENT FALLS:  Life alert? No  Use of a cane, walker or w/c? No  Grab bars in the bathroom? No  Shower chair or bench in shower? No  Elevated toilet seat or a handicapped toilet? No   TIMED UP AND GO:  Was the test performed? No .    Cognitive Function:     6CIT Screen 10/13/2019 10/04/2018 05/17/2017 05/16/2016  What Year? 0 points 0 points 0 points 0 points  What month? 0 points 0 points 0 points 0 points  What time? 0 points 0 points 0 points 0 points  Count back from 20 0 points 0 points 0 points 0 points  Months in reverse 0 points 0 points 0 points 0 points  Repeat phrase 0 points 2 points 0 points 4 points  Total Score 0 2 0 4    Immunizations Immunization History  Administered Date(s) Administered  . PFIZER SARS-COV-2 Vaccination 10/03/2019    TDAP status: Due, Education has been provided regarding the importance of this vaccine. Advised may receive this vaccine at local pharmacy or Health Dept. Aware to provide a copy of the vaccination record if obtained from local pharmacy or Health Dept. Verbalized acceptance and understanding. Flu Vaccine status: Declined, Education has been provided regarding the importance of this vaccine but patient still declined. Advised may receive this vaccine at local pharmacy or Health Dept. Aware to provide a copy of the vaccination record if obtained from local pharmacy or Health Dept. Verbalized acceptance and understanding. Pneumococcal vaccine status: Declined,  Education has been provided regarding the importance of this vaccine but patient still declined. Advised may receive this vaccine at local pharmacy or Health Dept. Aware to provide a copy of the vaccination record if obtained from local pharmacy or Health Dept. Verbalized acceptance and understanding.  Covid-19  vaccine status: needs second dose  Qualifies for Shingles Vaccine? Yes  Zostavax completed No   Shingrix Completed?: No.    Education has been provided regarding the importance of this vaccine. Patient has been advised to call insurance company to determine out of pocket expense if they have not yet received this vaccine. Advised may also receive vaccine at local pharmacy or Health Dept. Verbalized acceptance and understanding.  Screening Tests Health Maintenance  Topic Date Due  . TETANUS/TDAP  Never done  . INFLUENZA VACCINE  04/15/2020 (Originally 08/17/2019)  . PNA vac Low Risk Adult (1 of 2 - PCV13) 10/08/2020 (Originally 12/28/2013)  . COVID-19 Vaccine (2 - Pfizer 2-dose series) 10/24/2019  . COLONOSCOPY  06/03/2026  . Hepatitis C Screening  Completed    Health Maintenance  Health Maintenance Due  Topic Date Due  . TETANUS/TDAP  Never done    Colorectal cancer screening: Completed 06/02/2016. Repeat every 10 years  Lung Cancer Screening: (Low Dose CT Chest recommended if Age 60-80 years, 30 pack-year currently smoking OR have quit w/in 15years.) does not qualify.   Lung Cancer Screening Referral: no  Additional Screening:  Hepatitis C Screening: does qualify; Completed 03/04/2015  Vision Screening: Recommended annual ophthalmology exams for early detection of glaucoma and other disorders of the eye. Is the patient up to date with their annual eye exam?  Yes  Who is the provider or what is the name of the office in which the patient attends annual eye exams? Dr. Romeo Apple If pt is not established with a provider, would they like to be referred to a provider to establish care? No .   Dental Screening: Recommended annual dental exams for proper oral hygiene  Community Resource Referral / Chronic Care Management: CRR required this visit?  No   CCM required this visit?  No      Plan:     I have personally reviewed and noted the following in the patient's chart:    . Medical and social history . Use of alcohol, tobacco or illicit drugs  . Current medications and supplements . Functional ability and status . Nutritional status . Physical activity . Advanced directives . List of other physicians . Hospitalizations, surgeries, and ER visits in previous 12 months . Vitals . Screenings to include cognitive, depression, and falls . Referrals and appointments  In addition, I have reviewed and discussed with patient certain preventive protocols, quality metrics, and best practice recommendations. A written personalized care plan for preventive services as well as general preventive health recommendations were provided to patient.     Barb Merino, LPN   [id number]   Nurse Notes:

## 2019-10-13 NOTE — Patient Instructions (Signed)
Kenneth Johnston , Thank you for taking time to come for your Medicare Wellness Visit. I appreciate your ongoing commitment to your health goals. Please review the following plan we discussed and let me know if I can assist you in the future.   Screening recommendations/referrals: Colonoscopy: completed 06/02/2016 Recommended yearly ophthalmology/optometry visit for glaucoma screening and checkup Recommended yearly dental visit for hygiene and checkup  Vaccinations: Influenza vaccine: decline Pneumococcal vaccine: decline Tdap vaccine: decline Shingles vaccine: discussed   Covid-19:  10/03/2019, needs second dose  Advanced directives: Please bring a copy of your POA (Power of Attorney) and/or Living Will to your next appointment.   Conditions/risks identified: none  Next appointment: Follow up in one year for your annual wellness visit.   Preventive Care 43 Years and Older, Male Preventive care refers to lifestyle choices and visits with your health care provider that can promote health and wellness. What does preventive care include?  A yearly physical exam. This is also called an annual well check.  Dental exams once or twice a year.  Routine eye exams. Ask your health care provider how often you should have your eyes checked.  Personal lifestyle choices, including:  Daily care of your teeth and gums.  Regular physical activity.  Eating a healthy diet.  Avoiding tobacco and drug use.  Limiting alcohol use.  Practicing safe sex.  Taking low doses of aspirin every day.  Taking vitamin and mineral supplements as recommended by your health care provider. What happens during an annual well check? The services and screenings done by your health care provider during your annual well check will depend on your age, overall health, lifestyle risk factors, and family history of disease. Counseling  Your health care provider may ask you questions about your:  Alcohol  use.  Tobacco use.  Drug use.  Emotional well-being.  Home and relationship well-being.  Sexual activity.  Eating habits.  History of falls.  Memory and ability to understand (cognition).  Work and work Astronomer. Screening  You may have the following tests or measurements:  Height, weight, and BMI.  Blood pressure.  Lipid and cholesterol levels. These may be checked every 5 years, or more frequently if you are over 41 years old.  Skin check.  Lung cancer screening. You may have this screening every year starting at age 61 if you have a 30-pack-year history of smoking and currently smoke or have quit within the past 15 years.  Fecal occult blood test (FOBT) of the stool. You may have this test every year starting at age 33.  Flexible sigmoidoscopy or colonoscopy. You may have a sigmoidoscopy every 5 years or a colonoscopy every 10 years starting at age 71.  Prostate cancer screening. Recommendations will vary depending on your family history and other risks.  Hepatitis C blood test.  Hepatitis B blood test.  Sexually transmitted disease (STD) testing.  Diabetes screening. This is done by checking your blood sugar (glucose) after you have not eaten for a while (fasting). You may have this done every 1-3 years.  Abdominal aortic aneurysm (AAA) screening. You may need this if you are a current or former smoker.  Osteoporosis. You may be screened starting at age 57 if you are at high risk. Talk with your health care provider about your test results, treatment options, and if necessary, the need for more tests. Vaccines  Your health care provider may recommend certain vaccines, such as:  Influenza vaccine. This is recommended every year.  Tetanus,  diphtheria, and acellular pertussis (Tdap, Td) vaccine. You may need a Td booster every 10 years.  Zoster vaccine. You may need this after age 32.  Pneumococcal 13-valent conjugate (PCV13) vaccine. One dose is  recommended after age 83.  Pneumococcal polysaccharide (PPSV23) vaccine. One dose is recommended after age 62. Talk to your health care provider about which screenings and vaccines you need and how often you need them. This information is not intended to replace advice given to you by your health care provider. Make sure you discuss any questions you have with your health care provider. Document Released: 01/29/2015 Document Revised: 09/22/2015 Document Reviewed: 11/03/2014 Elsevier Interactive Patient Education  2017 [place] Prevention in the Home Falls can cause injuries. They can happen to people of all ages. There are many things you can do to make your home safe and to help prevent falls. What can I do on the outside of my home?  Regularly fix the edges of walkways and driveways and fix any cracks.  Remove anything that might make you trip as you walk through a door, such as a raised step or threshold.  Trim any bushes or trees on the path to your home.  Use bright outdoor lighting.  Clear any walking paths of anything that might make someone trip, such as rocks or tools.  Regularly check to see if handrails are loose or broken. Make sure that both sides of any steps have handrails.  Any raised decks and porches should have guardrails on the edges.  Have any leaves, snow, or ice cleared regularly.  Use sand or salt on walking paths during winter.  Clean up any spills in your garage right away. This includes oil or grease spills. What can I do in the bathroom?  Use night lights.  Install grab bars by the toilet and in the tub and shower. Do not use towel bars as grab bars.  Use non-skid mats or decals in the tub or shower.  If you need to sit down in the shower, use a plastic, non-slip stool.  Keep the floor dry. Clean up any water that spills on the floor as soon as it happens.  Remove soap buildup in the tub or shower regularly.  Attach bath mats  securely with double-sided non-slip rug tape.  Do not have throw rugs and other things on the floor that can make you trip. What can I do in the bedroom?  Use night lights.  Make sure that you have a light by your bed that is easy to reach.  Do not use any sheets or blankets that are too big for your bed. They should not hang down onto the floor.  Have a firm chair that has side arms. You can use this for support while you get dressed.  Do not have throw rugs and other things on the floor that can make you trip. What can I do in the kitchen?  Clean up any spills right away.  Avoid walking on wet floors.  Keep items that you use a lot in easy-to-reach places.  If you need to reach something above you, use a strong step stool that has a grab bar.  Keep electrical cords out of the way.  Do not use floor polish or wax that makes floors slippery. If you must use wax, use non-skid floor wax.  Do not have throw rugs and other things on the floor that can make you trip. What can I do with  my stairs?  Do not leave any items on the stairs.  Make sure that there are handrails on both sides of the stairs and use them. Fix handrails that are broken or loose. Make sure that handrails are as long as the stairways.  Check any carpeting to make sure that it is firmly attached to the stairs. Fix any carpet that is loose or worn.  Avoid having throw rugs at the top or bottom of the stairs. If you do have throw rugs, attach them to the floor with carpet tape.  Make sure that you have a light switch at the top of the stairs and the bottom of the stairs. If you do not have them, ask someone to add them for you. What else can I do to help prevent falls?  Wear shoes that:  Do not have high heels.  Have rubber bottoms.  Are comfortable and fit you well.  Are closed at the toe. Do not wear sandals.  If you use a stepladder:  Make sure that it is fully opened. Do not climb a closed  stepladder.  Make sure that both sides of the stepladder are locked into place.  Ask someone to hold it for you, if possible.  Clearly mark and make sure that you can see:  Any grab bars or handrails.  First and last steps.  Where the edge of each step is.  Use tools that help you move around (mobility aids) if they are needed. These include:  Canes.  Walkers.  Scooters.  Crutches.  Turn on the lights when you go into a dark area. Replace any light bulbs as soon as they burn out.  Set up your furniture so you have a clear path. Avoid moving your furniture around.  If any of your floors are uneven, fix them.  If there are any pets around you, be aware of where they are.  Review your medicines with your doctor. Some medicines can make you feel dizzy. This can increase your chance of falling. Ask your doctor what other things that you can do to help prevent falls. This information is not intended to replace advice given to you by your health care provider. Make sure you discuss any questions you have with your health care provider. Document Released: 10/29/2008 Document Revised: 06/10/2015 Document Reviewed: 02/06/2014 Elsevier Interactive Patient Education  2017 [employer].

## 2019-10-15 ENCOUNTER — Other Ambulatory Visit: Payer: Self-pay | Admitting: Family Medicine

## 2019-11-10 ENCOUNTER — Other Ambulatory Visit: Payer: Self-pay | Admitting: Nurse Practitioner

## 2019-11-10 ENCOUNTER — Encounter: Payer: Self-pay | Admitting: Nurse Practitioner

## 2019-11-10 DIAGNOSIS — R739 Hyperglycemia, unspecified: Secondary | ICD-10-CM

## 2019-12-31 ENCOUNTER — Other Ambulatory Visit: Payer: Self-pay | Admitting: Family Medicine

## 2020-01-07 NOTE — Telephone Encounter (Signed)
consent

## 2020-01-26 ENCOUNTER — Other Ambulatory Visit: Payer: Self-pay | Admitting: Family Medicine

## 2020-01-26 NOTE — Telephone Encounter (Signed)
Requested medication (s) are due for refill today:   Provider to determine  Requested medication (s) are on the active medication list:   Yes  Future visit scheduled:   Yes with Dr. Laural Benes   Last ordered: 10/13/2019 #20, 0 refills  No protocol assigned to this medication reason returned.   Requested Prescriptions  Pending Prescriptions Disp Refills   sulfamethoxazole-trimethoprim (BACTRIM DS) 800-160 MG tablet [Pharmacy Med Name: SULFAMETH/TRIMETHOPRIM 800/160MG  TB] 20 tablet 0    Sig: TAKE 1 TABLET BY MOUTH TWICE DAILY      Off-Protocol Failed - 01/26/2020  3:13 AM      Failed - Medication not assigned to a protocol, review manually.      Passed - Valid encounter within last 12 months    Recent Outpatient Visits           3 months ago Routine general medical examination at a health care facility   [facility], Megan P, DO   9 months ago Essential hypertension   [facility] [place], Megan P, DO   1 year ago Cellulitis of left lower extremity   [facility] Marjie Skiff, NP   1 year ago Cellulitis of left lower extremity   [facility] Aura Dials T, NP   1 year ago Left ankle swelling   [facility] [place], Salley Hews, [place]       Future Appointments             In 2 months Laural Benes, Oralia Rud, DO [facility]   In 8 months  [facility]

## 2020-02-16 ENCOUNTER — Other Ambulatory Visit: Payer: Self-pay | Admitting: Family Medicine

## 2020-02-16 NOTE — Telephone Encounter (Signed)
Requested medication (s) are due for refill today: yes  Requested medication (s) are on the active medication list: yes  Last refill: 01/26/20  Future visit scheduled: yes  Notes to clinic: not delegated   Requested Prescriptions  Pending Prescriptions Disp Refills   cyclobenzaprine (FLEXERIL) 10 MG tablet [Pharmacy Med Name: CYCLOBENZAPRINE 10MG  TABLETS] 30 tablet 6    Sig: TAKE 1 TABLET(10 MG) BY MOUTH AT BEDTIME AS NEEDED      Not Delegated - Analgesics:  Muscle Relaxants Failed - 02/16/2020  7:02 AM      Failed - This refill cannot be delegated      Passed - Valid encounter within last 6 months    Recent Outpatient Visits           4 months ago Routine general medical examination at a health care facility   [facility], Megan P, DO   10 months ago Essential hypertension   [facility] [place], Megan P, DO   1 year ago Cellulitis of left lower extremity   [facility] [place], [place] T, NP   1 year ago Cellulitis of left lower extremity   [facility] [place], [place] T, NP   1 year ago Left ankle swelling   [facility] [place], [place], Salley Hews       Future Appointments             In 1 month [place], Laural Benes, DO [facility]   In 8 months  [facility], [facility]

## 2020-02-17 ENCOUNTER — Other Ambulatory Visit: Payer: Self-pay | Admitting: Family Medicine

## 2020-02-17 DIAGNOSIS — M545 Low back pain, unspecified: Secondary | ICD-10-CM | POA: Diagnosis not present

## 2020-02-17 DIAGNOSIS — M9903 Segmental and somatic dysfunction of lumbar region: Secondary | ICD-10-CM | POA: Diagnosis not present

## 2020-02-17 DIAGNOSIS — M62830 Muscle spasm of back: Secondary | ICD-10-CM | POA: Diagnosis not present

## 2020-03-23 DIAGNOSIS — M545 Low back pain, unspecified: Secondary | ICD-10-CM | POA: Diagnosis not present

## 2020-03-23 DIAGNOSIS — M9903 Segmental and somatic dysfunction of lumbar region: Secondary | ICD-10-CM | POA: Diagnosis not present

## 2020-03-23 DIAGNOSIS — M62830 Muscle spasm of back: Secondary | ICD-10-CM | POA: Diagnosis not present

## 2020-03-31 DIAGNOSIS — T18108A Unspecified foreign body in esophagus causing other injury, initial encounter: Secondary | ICD-10-CM | POA: Diagnosis not present

## 2020-03-31 DIAGNOSIS — K209 Esophagitis, unspecified without bleeding: Secondary | ICD-10-CM | POA: Diagnosis not present

## 2020-03-31 DIAGNOSIS — Z7982 Long term (current) use of aspirin: Secondary | ICD-10-CM | POA: Diagnosis not present

## 2020-03-31 DIAGNOSIS — T18128A Food in esophagus causing other injury, initial encounter: Secondary | ICD-10-CM | POA: Diagnosis not present

## 2020-03-31 DIAGNOSIS — E039 Hypothyroidism, unspecified: Secondary | ICD-10-CM | POA: Diagnosis not present

## 2020-03-31 DIAGNOSIS — R1310 Dysphagia, unspecified: Secondary | ICD-10-CM | POA: Diagnosis not present

## 2020-03-31 DIAGNOSIS — K2100 Gastro-esophageal reflux disease with esophagitis, without bleeding: Secondary | ICD-10-CM | POA: Diagnosis not present

## 2020-03-31 DIAGNOSIS — K229 Disease of esophagus, unspecified: Secondary | ICD-10-CM | POA: Diagnosis not present

## 2020-03-31 DIAGNOSIS — R1110 Vomiting, unspecified: Secondary | ICD-10-CM | POA: Diagnosis not present

## 2020-03-31 DIAGNOSIS — Z791 Long term (current) use of non-steroidal anti-inflammatories (NSAID): Secondary | ICD-10-CM | POA: Diagnosis not present

## 2020-03-31 DIAGNOSIS — Z79899 Other long term (current) drug therapy: Secondary | ICD-10-CM | POA: Diagnosis not present

## 2020-03-31 DIAGNOSIS — K449 Diaphragmatic hernia without obstruction or gangrene: Secondary | ICD-10-CM | POA: Diagnosis not present

## 2020-03-31 DIAGNOSIS — K469 Unspecified abdominal hernia without obstruction or gangrene: Secondary | ICD-10-CM | POA: Diagnosis not present

## 2020-03-31 DIAGNOSIS — R112 Nausea with vomiting, unspecified: Secondary | ICD-10-CM | POA: Diagnosis not present

## 2020-04-07 ENCOUNTER — Encounter: Payer: Self-pay | Admitting: Family Medicine

## 2020-04-07 ENCOUNTER — Other Ambulatory Visit: Payer: Self-pay

## 2020-04-07 ENCOUNTER — Ambulatory Visit (INDEPENDENT_AMBULATORY_CARE_PROVIDER_SITE_OTHER): Payer: Medicare Other | Admitting: Family Medicine

## 2020-04-07 VITALS — BP 120/68 | HR 87 | Temp 98.0°F | Wt 243.0 lb

## 2020-04-07 DIAGNOSIS — E790 Hyperuricemia without signs of inflammatory arthritis and tophaceous disease: Secondary | ICD-10-CM | POA: Diagnosis not present

## 2020-04-07 DIAGNOSIS — N1831 Chronic kidney disease, stage 3a: Secondary | ICD-10-CM

## 2020-04-07 DIAGNOSIS — I10 Essential (primary) hypertension: Secondary | ICD-10-CM

## 2020-04-07 DIAGNOSIS — E039 Hypothyroidism, unspecified: Secondary | ICD-10-CM

## 2020-04-07 DIAGNOSIS — E782 Mixed hyperlipidemia: Secondary | ICD-10-CM | POA: Diagnosis not present

## 2020-04-07 MED ORDER — MELOXICAM 15 MG PO TABS: ORAL_TABLET | ORAL | 1 refills | Status: DC

## 2020-04-07 MED ORDER — LISINOPRIL 10 MG PO TABS: 10.0000 mg | ORAL_TABLET | Freq: Every day | ORAL | 1 refills | Status: DC

## 2020-04-07 MED ORDER — AMLODIPINE BESYLATE 10 MG PO TABS: ORAL_TABLET | ORAL | 1 refills | Status: DC

## 2020-04-07 MED ORDER — ROSUVASTATIN CALCIUM 5 MG PO TABS: 5.0000 mg | ORAL_TABLET | Freq: Every day | ORAL | 1 refills | Status: DC

## 2020-04-07 NOTE — Assessment & Plan Note (Signed)
Under good control on current regimen. Continue current regimen. Continue to monitor. Call with any concerns. Refills given. Labs drawn today.   

## 2020-04-07 NOTE — Assessment & Plan Note (Signed)
Rechecking labs today. Await results. Treat as needed.  °

## 2020-04-07 NOTE — Progress Notes (Signed)
BP 120/68   Pulse 87   Temp 98 F (36.7 C)   Wt 243 lb (110.2 kg)   SpO2 97%   BMI 31.20 kg/m    Subjective:    Patient ID: Kenneth Johnston, male    DOB: [birth year], 70 y.o.   MRN: [id number]  HPI: Kenneth Johnston is a 70 y.o. male  Chief Complaint  Patient presents with  . Hypertension  . Hypothyroidism  . Hyperlipidemia   HYPERTENSION / HYPERLIPIDEMIA Satisfied with current treatment? yes Duration of hypertension: chronic BP monitoring frequency: not checking BP medication side effects: no Past BP meds: amlodipine, linisopril Duration of hyperlipidemia: chronic Cholesterol medication side effects: no Cholesterol supplements: none Past cholesterol medications: crestor Medication compliance: excellent compliance Aspirin: yes Recent stressors: no Recurrent headaches: no Visual changes: no Palpitations: no Dyspnea: no Chest pain: no Lower extremity edema: no Dizzy/lightheaded: no  HYPOTHYROIDISM Thyroid control status:controlled Satisfied with current treatment? yes Medication side effects: no Medication compliance: excellent compliance Etiology of hypothyroidism:  Recent dose adjustment:no Fatigue: no Cold intolerance: no Heat intolerance: no Weight gain: no Weight loss: no Constipation: no Diarrhea/loose stools: no Palpitations: no Lower extremity edema: no Anxiety/depressed mood: no  Relevant past medical, surgical, family and social history reviewed and updated as indicated. Interim medical history since our last visit reviewed. Allergies and medications reviewed and updated.  Review of Systems  Constitutional: Negative.   Respiratory: Negative.   Cardiovascular: Negative.   Gastrointestinal: Negative.   Musculoskeletal: Negative.   Neurological: Negative.   Psychiatric/Behavioral: Negative.     Per HPI unless specifically indicated above     Objective:    BP 120/68   Pulse 87   Temp 98 F (36.7 C)   Wt 243 lb (110.2 kg)    SpO2 97%   BMI 31.20 kg/m   Wt Readings from Last 3 Encounters:  04/07/20 243 lb (110.2 kg)  10/13/19 237 lb (107.5 kg)  10/09/19 237 lb (107.5 kg)    Physical Exam Vitals and nursing note reviewed.  Constitutional:      General: He is not in acute distress.    Appearance: Normal appearance. He is not ill-appearing, toxic-appearing or diaphoretic.  HENT:     Head: Normocephalic and atraumatic.     Right Ear: External ear normal.     Left Ear: External ear normal.     Nose: Nose normal.     Mouth/Throat:     Mouth: Mucous membranes are moist.     Pharynx: Oropharynx is clear.  Eyes:     General: No scleral icterus.       Right eye: No discharge.        Left eye: No discharge.     Extraocular Movements: Extraocular movements intact.     Conjunctiva/sclera: Conjunctivae normal.     Pupils: Pupils are equal, round, and reactive to light.  Cardiovascular:     Rate and Rhythm: Normal rate and regular rhythm.     Pulses: Normal pulses.     Heart sounds: Normal heart sounds. No murmur heard. No friction rub. No gallop.   Pulmonary:     Effort: Pulmonary effort is normal. No respiratory distress.     Breath sounds: Normal breath sounds. No stridor. No wheezing, rhonchi or rales.  Chest:     Chest wall: No tenderness.  Musculoskeletal:        General: Normal range of motion.     Cervical back: Normal range of motion and neck supple.  Skin:    General: Skin is warm and dry.     Capillary Refill: Capillary refill takes less than 2 seconds.     Coloration: Skin is not jaundiced or pale.     Findings: No bruising, erythema, lesion or rash.  Neurological:     General: No focal deficit present.     Mental Status: He is alert and oriented to person, place, and time. Mental status is at baseline.  Psychiatric:        Mood and Affect: Mood normal.        Behavior: Behavior normal.        Thought Content: Thought content normal.        Judgment: Judgment normal.     Results for  orders placed or performed in visit on 10/09/19  Urine Culture   Specimen: Urine   UR  Result Value Ref Range   Urine Culture, Routine Final report (A)    Organism ID, Bacteria Escherichia coli (A)    Antimicrobial Susceptibility Comment   Microscopic Examination   Urine  Result Value Ref Range   WBC, UA 11-30 (A) 0 - 5 /hpf   RBC 3-10 (A) 0 - 2 /hpf   Epithelial Cells (non renal) 0-10 0 - 10 /hpf   Bacteria, UA Many (A) None seen/Few  CBC with Differential/Platelet  Result Value Ref Range   WBC 9.4 3.4 - 10.8 x10E3/uL   RBC 5.52 4.14 - 5.80 x10E6/uL   Hemoglobin 16.1 13.0 - 17.7 g/dL   Hematocrit 74.0 81.4 - 51.0 %   MCV 87 79 - 97 fL   MCH 29.2 26.6 - 33.0 pg   MCHC 33.5 31.5 - 35.7 g/dL   RDW 48.1 85.6 - 31.4 %   Platelets 213 150 - 450 x10E3/uL   Neutrophils 72 Not Estab. %   Lymphs 15 Not Estab. %   Monocytes 9 Not Estab. %   Eos 3 Not Estab. %   Basos 1 Not Estab. %   Neutrophils Absolute 6.8 1.4 - 7.0 x10E3/uL   Lymphocytes Absolute 1.4 0.7 - 3.1 x10E3/uL   Monocytes Absolute 0.8 0.1 - 0.9 x10E3/uL   EOS (ABSOLUTE) 0.2 0.0 - 0.4 x10E3/uL   Basophils Absolute 0.1 0.0 - 0.2 x10E3/uL   Immature Granulocytes 0 Not Estab. %   Immature Grans (Abs) 0.0 0.0 - 0.1 x10E3/uL  Comprehensive metabolic panel  Result Value Ref Range   Glucose 119 (H) 65 - 99 mg/dL   BUN 20 8 - 27 mg/dL   Creatinine, Ser 9.70 (H) 0.76 - 1.27 mg/dL   GFR calc non Af Amer 55 (L) >59 mL/min/1.73   GFR calc Af Amer 64 >59 mL/min/1.73   BUN/Creatinine Ratio 15 10 - 24   Sodium 139 134 - 144 mmol/L   Potassium 4.2 3.5 - 5.2 mmol/L   Chloride 103 96 - 106 mmol/L   CO2 22 20 - 29 mmol/L   Calcium 9.5 8.6 - 10.2 mg/dL   Total Protein 7.1 6.0 - 8.5 g/dL   Albumin 4.5 3.8 - 4.8 g/dL   Globulin, Total 2.6 1.5 - 4.5 g/dL   Albumin/Globulin Ratio 1.7 1.2 - 2.2   Bilirubin Total 0.4 0.0 - 1.2 mg/dL   Alkaline Phosphatase 57 44 - 121 IU/L   AST 19 0 - 40 IU/L   ALT 24 0 - 44 IU/L  Lipid Panel w/o  Chol/HDL Ratio  Result Value Ref Range   Cholesterol, Total 135 100 - 199 mg/dL  Triglycerides 216 (H) 0 - 149 mg/dL   HDL 31 (L) >59 mg/dL   VLDL Cholesterol Cal 36 5 - 40 mg/dL   LDL Chol Calc (NIH) 68 0 - 99 mg/dL  Microalbumin, Urine Waived  Result Value Ref Range   Microalb, Ur Waived 80 (H) 0 - 19 mg/L   Creatinine, Urine Waived 200 10 - 300 mg/dL   Microalb/Creat Ratio 30-300 (H) <30 mg/g  PSA  Result Value Ref Range   Prostate Specific Ag, Serum 1.7 0.0 - 4.0 ng/mL  TSH  Result Value Ref Range   TSH 2.610 0.450 - 4.500 uIU/mL  Urinalysis, Routine w reflex microscopic  Result Value Ref Range   Specific Gravity, UA 1.025 1.005 - 1.030   pH, UA 5.0 5.0 - 7.5   Color, UA Yellow Yellow   Appearance Ur Cloudy (A) Clear   Leukocytes,UA 2+ (A) Negative   Protein,UA Negative Negative/Trace   Glucose, UA Negative Negative   Ketones, UA Negative Negative   RBC, UA 1+ (A) Negative   Bilirubin, UA Negative Negative   Urobilinogen, Ur 0.2 0.2 - 1.0 mg/dL   Nitrite, UA Positive (A) Negative   Microscopic Examination See below:   Uric acid  Result Value Ref Range   Uric Acid 7.7 3.8 - 8.4 mg/dL      Assessment & Plan:   Problem List Items Addressed This Visit      Cardiovascular and Mediastinum   HTN (hypertension) - Primary    Under good control on current regimen. Continue current regimen. Continue to monitor. Call with any concerns. Refills given. Labs drawn today.       Relevant Medications   rosuvastatin (CRESTOR) 5 MG tablet   lisinopril (ZESTRIL) 10 MG tablet   amLODipine (NORVASC) 10 MG tablet   Other Relevant Orders   CBC with Differential/Platelet   Comprehensive metabolic panel     Endocrine   Hypothyroidism    Rechecking labs today. Await results. Treat as needed.       Relevant Orders   CBC with Differential/Platelet   Comprehensive metabolic panel   TSH     Genitourinary   Chronic kidney disease, stage 3a (HCC)    Rechecking labs today. Await  results. Treat as needed.         Other   Hyperlipidemia    Under good control on current regimen. Continue current regimen. Continue to monitor. Call with any concerns. Refills given. Labs drawn today.      Relevant Medications   rosuvastatin (CRESTOR) 5 MG tablet   lisinopril (ZESTRIL) 10 MG tablet   amLODipine (NORVASC) 10 MG tablet   Other Relevant Orders   CBC with Differential/Platelet   Comprehensive metabolic panel   Lipid Panel w/o Chol/HDL Ratio   Elevated uric acid in blood    Rechecking labs today. Await results. Treat as needed.       Relevant Orders   Uric acid   CBC with Differential/Platelet   Comprehensive metabolic panel       Follow up plan: Return in about 6 months (around 10/08/2020) for physical.

## 2020-04-08 ENCOUNTER — Encounter: Payer: Self-pay | Admitting: Family Medicine

## 2020-04-08 LAB — COMPREHENSIVE METABOLIC PANEL
ALT: 16 IU/L (ref 0–44)
AST: 17 IU/L (ref 0–40)
Albumin/Globulin Ratio: 1.6 (ref 1.2–2.2)
Albumin: 4.4 g/dL (ref 3.7–4.7)
Alkaline Phosphatase: 64 IU/L (ref 44–121)
BUN/Creatinine Ratio: 9 — ABNORMAL LOW (ref 10–24)
BUN: 13 mg/dL (ref 8–27)
Bilirubin Total: 0.3 mg/dL (ref 0.0–1.2)
CO2: 23 mmol/L (ref 20–29)
Calcium: 9.3 mg/dL (ref 8.6–10.2)
Chloride: 101 mmol/L (ref 96–106)
Creatinine, Ser: 1.48 mg/dL — ABNORMAL HIGH (ref 0.76–1.27)
Globulin, Total: 2.7 g/dL (ref 1.5–4.5)
Glucose: 137 mg/dL — ABNORMAL HIGH (ref 65–99)
Potassium: 4.3 mmol/L (ref 3.5–5.2)
Sodium: 140 mmol/L (ref 134–144)
Total Protein: 7.1 g/dL (ref 6.0–8.5)
eGFR: 50 mL/min/{1.73_m2} — ABNORMAL LOW (ref >59)

## 2020-04-08 LAB — LIPID PANEL W/O CHOL/HDL RATIO
Cholesterol, Total: 130 mg/dL (ref 100–199)
HDL: 28 mg/dL — ABNORMAL LOW (ref >39)
LDL Chol Calc (NIH): 67 mg/dL (ref 0–99)
Triglycerides: 211 mg/dL — ABNORMAL HIGH (ref 0–149)
VLDL Cholesterol Cal: 35 mg/dL (ref 5–40)

## 2020-04-08 LAB — CBC WITH DIFFERENTIAL/PLATELET
Basophils Absolute: 0.1 10*3/uL (ref 0.0–0.2)
Basos: 1 %
EOS (ABSOLUTE): 0.2 10*3/uL (ref 0.0–0.4)
Eos: 3 %
Hematocrit: 49.4 % (ref 37.5–51.0)
Hemoglobin: 16.3 g/dL (ref 13.0–17.7)
Immature Grans (Abs): 0 10*3/uL (ref 0.0–0.1)
Immature Granulocytes: 0 %
Lymphocytes Absolute: 1.2 10*3/uL (ref 0.7–3.1)
Lymphs: 18 %
MCH: 28.4 pg (ref 26.6–33.0)
MCHC: 33 g/dL (ref 31.5–35.7)
MCV: 86 fL (ref 79–97)
Monocytes Absolute: 0.6 10*3/uL (ref 0.1–0.9)
Monocytes: 8 %
Neutrophils Absolute: 4.9 10*3/uL (ref 1.4–7.0)
Neutrophils: 70 %
Platelets: 238 10*3/uL (ref 150–450)
RBC: 5.74 x10E6/uL (ref 4.14–5.80)
RDW: 13.4 % (ref 11.6–15.4)
WBC: 7 10*3/uL (ref 3.4–10.8)

## 2020-04-08 LAB — TSH: TSH: 3.47 u[IU]/mL (ref 0.450–4.500)

## 2020-04-08 LAB — URIC ACID: Uric Acid: 7.6 mg/dL (ref 3.8–8.4)

## 2020-04-11 ENCOUNTER — Other Ambulatory Visit: Payer: Self-pay | Admitting: Family Medicine

## 2020-04-11 NOTE — Telephone Encounter (Signed)
Last ordered 04/07/20 #90 tabs plus one refill. This request is no longer needed-refused.

## 2020-04-12 ENCOUNTER — Other Ambulatory Visit: Payer: Self-pay | Admitting: Family Medicine

## 2020-04-12 MED ORDER — LEVOTHYROXINE SODIUM 75 MCG PO TABS: ORAL_TABLET | ORAL | 3 refills | Status: DC

## 2020-04-14 DIAGNOSIS — Z719 Counseling, unspecified: Secondary | ICD-10-CM | POA: Diagnosis not present

## 2020-04-14 DIAGNOSIS — R1310 Dysphagia, unspecified: Secondary | ICD-10-CM | POA: Diagnosis not present

## 2020-04-19 DIAGNOSIS — M62830 Muscle spasm of back: Secondary | ICD-10-CM | POA: Diagnosis not present

## 2020-04-19 DIAGNOSIS — M9903 Segmental and somatic dysfunction of lumbar region: Secondary | ICD-10-CM | POA: Diagnosis not present

## 2020-04-19 DIAGNOSIS — M545 Low back pain, unspecified: Secondary | ICD-10-CM | POA: Diagnosis not present

## 2020-05-12 DIAGNOSIS — M9903 Segmental and somatic dysfunction of lumbar region: Secondary | ICD-10-CM | POA: Diagnosis not present

## 2020-05-12 DIAGNOSIS — M545 Low back pain, unspecified: Secondary | ICD-10-CM | POA: Diagnosis not present

## 2020-05-12 DIAGNOSIS — M62830 Muscle spasm of back: Secondary | ICD-10-CM | POA: Diagnosis not present

## 2020-05-23 ENCOUNTER — Other Ambulatory Visit: Payer: Self-pay | Admitting: Family Medicine

## 2020-06-03 ENCOUNTER — Other Ambulatory Visit: Payer: Self-pay | Admitting: Family Medicine

## 2020-06-03 NOTE — Telephone Encounter (Signed)
Requested medication (s) are due for refill today: Yes  Requested medication (s) are on the active medication list: Yes  Last refill:  10/09/19  Future visit scheduled: Yes  Notes to clinic:  See request.    Requested Prescriptions  Pending Prescriptions Disp Refills   cyclobenzaprine (FLEXERIL) 10 MG tablet [Pharmacy Med Name: CYCLOBENZAPRINE 10MG  TABLETS] 30 tablet 6    Sig: TAKE 1 TABLET(10 MG) BY MOUTH AT BEDTIME AS NEEDED      Not Delegated - Analgesics:  Muscle Relaxants Failed - 06/03/2020  7:09 AM      Failed - This refill cannot be delegated      Passed - Valid encounter within last 6 months    Recent Outpatient Visits           1 month ago Primary hypertension   [facility] [place], Megan P, DO   7 months ago Routine general medical examination at a health care facility   [facility] [place], [place], DO   1 year ago Essential hypertension   [facility] [place], Megan P, DO   1 year ago Cellulitis of left lower extremity   [facility] [place], [place] T, NP   1 year ago Cellulitis of left lower extremity   [facility] Corrie Dandy, NP       Future Appointments             In 4 months Johnson, Marjie Skiff, DO [facility], [facility]   In 4 months  [facility]

## 2020-06-03 NOTE — Telephone Encounter (Signed)
Routing to provider  

## 2020-06-22 ENCOUNTER — Other Ambulatory Visit: Payer: Self-pay | Admitting: Family Medicine

## 2020-06-23 MED ORDER — CYCLOBENZAPRINE HCL 10 MG PO TABS: 10.0000 mg | ORAL_TABLET | Freq: Every day | ORAL | 0 refills | Status: DC

## 2020-06-23 NOTE — Telephone Encounter (Signed)
Refill request for Cyclobenzaprine 10 mg 1 tab at bedtime prn.   LOV 04/07/20   Next OV 10/11/20

## 2020-08-03 DIAGNOSIS — E039 Hypothyroidism, unspecified: Secondary | ICD-10-CM | POA: Diagnosis not present

## 2020-08-03 DIAGNOSIS — Z0000 Encounter for general adult medical examination without abnormal findings: Secondary | ICD-10-CM | POA: Diagnosis not present

## 2020-08-03 DIAGNOSIS — I10 Essential (primary) hypertension: Secondary | ICD-10-CM | POA: Diagnosis not present

## 2020-08-03 DIAGNOSIS — R0600 Dyspnea, unspecified: Secondary | ICD-10-CM | POA: Diagnosis not present

## 2020-08-03 DIAGNOSIS — R7303 Prediabetes: Secondary | ICD-10-CM | POA: Diagnosis not present

## 2020-08-03 DIAGNOSIS — E782 Mixed hyperlipidemia: Secondary | ICD-10-CM | POA: Diagnosis not present

## 2020-09-08 ENCOUNTER — Other Ambulatory Visit: Payer: Self-pay | Admitting: Family Medicine

## 2020-09-08 DIAGNOSIS — L728 Other follicular cysts of the skin and subcutaneous tissue: Secondary | ICD-10-CM | POA: Diagnosis not present

## 2020-09-08 NOTE — Telephone Encounter (Signed)
Requested Prescriptions  Pending Prescriptions Disp Refills  . amLODipine (NORVASC) 10 MG tablet [Pharmacy Med Name: AMLODIPINE BESYLATE 10MG  TABLETS] 90 tablet 0    Sig: TAKE 1 TABLET(10 MG) BY MOUTH DAILY     Cardiovascular:  Calcium Channel Blockers Passed - 09/08/2020  5:13 PM      Passed - Last BP in normal range    BP Readings from Last 1 Encounters:  04/07/20 120/68         Passed - Valid encounter within last 6 months    Recent Outpatient Visits          5 months ago Primary hypertension   [facility] [place], Megan P, DO   11 months ago Routine general medical examination at a health care facility   [facility], [place], DO   1 year ago Essential hypertension   [facility] [place], Megan P, DO   1 year ago Cellulitis of left lower extremity   [facility] [place], [place] T, NP   1 year ago Cellulitis of left lower extremity   [facility] Corrie Dandy, NP      Future Appointments            In 1 month Johnson, Marjie Skiff, DO [facility]   In 1 month  [facility]

## 2020-09-12 ENCOUNTER — Other Ambulatory Visit: Payer: Self-pay | Admitting: Family Medicine

## 2020-09-12 NOTE — Telephone Encounter (Signed)
Last RF 09/08/20 #90 too soon

## 2020-09-13 ENCOUNTER — Other Ambulatory Visit: Payer: Self-pay | Admitting: Family Medicine

## 2020-09-13 MED ORDER — CYCLOBENZAPRINE HCL 10 MG PO TABS: 10.0000 mg | ORAL_TABLET | Freq: Every day | ORAL | 0 refills | Status: DC

## 2020-09-13 NOTE — Telephone Encounter (Signed)
Can this be sent in for the patient?

## 2020-09-23 DIAGNOSIS — R238 Other skin changes: Secondary | ICD-10-CM | POA: Diagnosis not present

## 2020-09-23 DIAGNOSIS — L728 Other follicular cysts of the skin and subcutaneous tissue: Secondary | ICD-10-CM | POA: Diagnosis not present

## 2020-09-23 DIAGNOSIS — L720 Epidermal cyst: Secondary | ICD-10-CM | POA: Diagnosis not present

## 2020-09-23 DIAGNOSIS — R208 Other disturbances of skin sensation: Secondary | ICD-10-CM | POA: Diagnosis not present

## 2020-10-07 DIAGNOSIS — R208 Other disturbances of skin sensation: Secondary | ICD-10-CM | POA: Diagnosis not present

## 2020-10-07 DIAGNOSIS — L728 Other follicular cysts of the skin and subcutaneous tissue: Secondary | ICD-10-CM | POA: Diagnosis not present

## 2020-10-07 DIAGNOSIS — L720 Epidermal cyst: Secondary | ICD-10-CM | POA: Diagnosis not present

## 2020-10-07 DIAGNOSIS — R238 Other skin changes: Secondary | ICD-10-CM | POA: Diagnosis not present

## 2020-10-11 ENCOUNTER — Ambulatory Visit (INDEPENDENT_AMBULATORY_CARE_PROVIDER_SITE_OTHER): Payer: Medicare Other | Admitting: Family Medicine

## 2020-10-11 ENCOUNTER — Other Ambulatory Visit: Payer: Self-pay

## 2020-10-11 ENCOUNTER — Encounter: Payer: Self-pay | Admitting: Family Medicine

## 2020-10-11 VITALS — BP 137/87 | HR 67 | Ht 74.0 in | Wt 233.0 lb

## 2020-10-11 DIAGNOSIS — Z125 Encounter for screening for malignant neoplasm of prostate: Secondary | ICD-10-CM

## 2020-10-11 DIAGNOSIS — I10 Essential (primary) hypertension: Secondary | ICD-10-CM

## 2020-10-11 DIAGNOSIS — E039 Hypothyroidism, unspecified: Secondary | ICD-10-CM

## 2020-10-11 DIAGNOSIS — E782 Mixed hyperlipidemia: Secondary | ICD-10-CM

## 2020-10-11 DIAGNOSIS — M545 Low back pain, unspecified: Secondary | ICD-10-CM | POA: Diagnosis not present

## 2020-10-11 DIAGNOSIS — M9903 Segmental and somatic dysfunction of lumbar region: Secondary | ICD-10-CM | POA: Diagnosis not present

## 2020-10-11 DIAGNOSIS — N1831 Chronic kidney disease, stage 3a: Secondary | ICD-10-CM | POA: Diagnosis not present

## 2020-10-11 DIAGNOSIS — M9904 Segmental and somatic dysfunction of sacral region: Secondary | ICD-10-CM | POA: Diagnosis not present

## 2020-10-11 DIAGNOSIS — D692 Other nonthrombocytopenic purpura: Secondary | ICD-10-CM | POA: Diagnosis not present

## 2020-10-11 DIAGNOSIS — Z0000 Encounter for general adult medical examination without abnormal findings: Secondary | ICD-10-CM

## 2020-10-11 DIAGNOSIS — E118 Type 2 diabetes mellitus with unspecified complications: Secondary | ICD-10-CM

## 2020-10-11 DIAGNOSIS — E790 Hyperuricemia without signs of inflammatory arthritis and tophaceous disease: Secondary | ICD-10-CM | POA: Diagnosis not present

## 2020-10-11 DIAGNOSIS — E1129 Type 2 diabetes mellitus with other diabetic kidney complication: Secondary | ICD-10-CM | POA: Insufficient documentation

## 2020-10-11 DIAGNOSIS — R739 Hyperglycemia, unspecified: Secondary | ICD-10-CM | POA: Diagnosis not present

## 2020-10-11 DIAGNOSIS — M62830 Muscle spasm of back: Secondary | ICD-10-CM | POA: Diagnosis not present

## 2020-10-11 MED ORDER — ASPIRIN EC 81 MG PO TBEC: 81.0000 mg | DELAYED_RELEASE_TABLET | Freq: Every day | ORAL | 3 refills | Status: DC

## 2020-10-11 MED ORDER — MELOXICAM 15 MG PO TABS: ORAL_TABLET | ORAL | 1 refills | Status: DC

## 2020-10-11 MED ORDER — OMEPRAZOLE 40 MG PO CPDR: 40.0000 mg | DELAYED_RELEASE_CAPSULE | Freq: Every day | ORAL | 1 refills | Status: DC

## 2020-10-11 MED ORDER — ROSUVASTATIN CALCIUM 5 MG PO TABS: 5.0000 mg | ORAL_TABLET | Freq: Every day | ORAL | 1 refills | Status: DC

## 2020-10-11 MED ORDER — METFORMIN HCL 500 MG PO TABS: 500.0000 mg | ORAL_TABLET | Freq: Every day | ORAL | 1 refills | Status: DC

## 2020-10-11 MED ORDER — LISINOPRIL 10 MG PO TABS: 10.0000 mg | ORAL_TABLET | Freq: Every day | ORAL | 1 refills | Status: DC

## 2020-10-11 NOTE — Assessment & Plan Note (Signed)
Under good control on current regimen. Continue current regimen. Continue to monitor. Call with any concerns. Refills given. Labs drawn today.   

## 2020-10-11 NOTE — Assessment & Plan Note (Signed)
Rechecking labs today. Await results. Treat as needed.  °

## 2020-10-11 NOTE — Assessment & Plan Note (Signed)
Reassured patient. Continue to monitor.  

## 2020-10-11 NOTE — Progress Notes (Signed)
BP 137/87   Pulse 67   Ht 6' 2"  (1.88 m)   Wt 233 lb (105.7 kg)   BMI 29.92 kg/m    Subjective:    Patient ID: Kenneth Johnston, male    DOB: 04-29-1950, 70 y.o.   MRN: [id number]  HPI: Kenneth Johnston is a 70 y.o. male presenting on 10/11/2020 for comprehensive medical examination. Current medical complaints include:  Saw a doctor in [place] and got diagnosed with diabetes and was started on metformin.   DIABETES Hypoglycemic episodes:no Polydipsia/polyuria: no Visual disturbance: no Chest pain: no Paresthesias: no Glucose Monitoring: no  Accucheck frequency: Not Checking Taking Insulin?: no Blood Pressure Monitoring: not checking Retinal Examination: Not up to Date Foot Exam: done today Diabetic Education: Completed Pneumovax:  Declined Influenza: Declined Aspirin: no  HYPERTENSION / HYPERLIPIDEMIA Satisfied with current treatment? yes Duration of hypertension: chronic BP monitoring frequency: not checking BP medication side effects: no Past BP meds: lisinopril Duration of hyperlipidemia: chronic Cholesterol medication side effects: no Cholesterol supplements: none Past cholesterol medications: crestor Medication compliance: excellent compliance Aspirin: yes Recent stressors: no Recurrent headaches: no Visual changes: no Palpitations: no Dyspnea: no Chest pain: no Lower extremity edema: no Dizzy/lightheaded: no  HYPOTHYROIDISM Thyroid control status:controlled Satisfied with current treatment? yes Medication side effects: no Medication compliance: excellent compliance Etiology of hypothyroidism:  Recent dose adjustment:no Fatigue: no Cold intolerance: no Heat intolerance: no Weight gain: no Weight loss: no Constipation: no Diarrhea/loose stools: no Palpitations: no Lower extremity edema: no Anxiety/depressed mood: no  He currently lives with: wife Interim Problems from his last visit: no  Depression Screen done today and results listed  below:  Depression screen [facility] 2/9 10/13/2019 10/09/2019 10/04/2018 05/17/2017 05/16/2016  Decreased Interest 0 0 0 0 0  Down, Depressed, Hopeless 0 0 0 0 0  PHQ - 2 Score 0 0 0 0 0  Altered sleeping - 0 - - -  Tired, decreased energy - 0 - - -  Change in appetite - 0 - - -  Feeling bad or failure about yourself  - 0 - - -  Trouble concentrating - 0 - - -  Moving slowly or fidgety/restless - 0 - - -  Suicidal thoughts - 0 - - -  PHQ-9 Score - 0 - - -  Difficult doing work/chores - Not difficult at all - - -    Past Medical History:  Past Medical History:  Diagnosis Date   Allergy    Arthritis    CKD (chronic kidney disease), stage III (HCC)    Coronary artery calcification    Hyperlipidemia    Hypertension    Hypothyroidism    Umbilical hernia 0/01/7492    Surgical History:  Past Surgical History:  Procedure Laterality Date   KNEE CARTILAGE SURGERY Bilateral     Medications:  Current Outpatient Medications on File Prior to Visit  Medication Sig   cyclobenzaprine (FLEXERIL) 10 MG tablet Take 1 tablet (10 mg total) by mouth at bedtime.   doxycycline (MONODOX) 100 MG capsule Take 100 mg by mouth 2 (two) times daily.   levothyroxine (SYNTHROID) 75 MCG tablet TAKE 1 TABLET(75 MCG) BY MOUTH DAILY BEFORE BREAKFAST   No current facility-administered medications on file prior to visit.    Allergies:  No Known Allergies  Social History:  Social History   Socioeconomic History   Marital status: Married    Spouse name: Not on file   Number of children: Not on file   Years of  education: Not on file   Highest education level: Not on file  Occupational History   Not on file  Tobacco Use   Smoking status: Former    Types: Cigarettes    Quit date: 03/04/1995    Years since quitting: 25.6   Smokeless tobacco: Never  Vaping Use   Vaping Use: Never used  Substance and Sexual Activity   Alcohol use: Yes    Comment: very seldom    Drug use: No   Sexual activity: Yes  Other  Topics Concern   Not on file  Social History Narrative   [place]    Social Determinants of Health   Financial Resource Strain: Low Risk    Difficulty of Paying Living Expenses: Not hard at all  Food Insecurity: No Food Insecurity   Worried About Charity fundraiser in the Last Year: Never true   [place] in the Last Year: Never true  Transportation Needs: No Transportation Needs   Lack of Transportation (Medical): No   Lack of Transportation (Non-Medical): No  Physical Activity: Sufficiently Active   Days of Exercise per Week: 3 days   Minutes of Exercise per Session: 60 min  Stress: No Stress Concern Present   Feeling of Stress : Not at all  Social Connections: Not on file  Intimate Partner Violence: Not on file   Social History   Tobacco Use  Smoking Status Former   Types: Cigarettes   Quit date: 03/04/1995   Years since quitting: 25.6  Smokeless Tobacco Never   Social History   Substance and Sexual Activity  Alcohol Use Yes   Comment: very seldom     Family History:  Family History  Problem Relation Age of Onset   Kidney failure Mother    Aneurysm Father        brain   Kidney disease Paternal Grandmother    Heart attack Paternal Grandfather     Past medical history, surgical history, medications, allergies, family history and social history reviewed with patient today and changes made to appropriate areas of the chart.   Review of Systems  Constitutional: Negative.   HENT: Negative.    Eyes: Negative.   Respiratory: Negative.    Cardiovascular: Negative.   Gastrointestinal: Negative.   Genitourinary: Negative.   Musculoskeletal: Negative.   Skin: Negative.   Neurological: Negative.   Endo/Heme/Allergies:  Negative for environmental allergies and polydipsia. Bruises/bleeds easily.  Psychiatric/Behavioral: Negative.    All other ROS negative except what is listed above and in the HPI.      Objective:    BP 137/87   Pulse 67   Ht 6' 2"  (1.88  m)   Wt 233 lb (105.7 kg)   BMI 29.92 kg/m   Wt Readings from Last 3 Encounters:  10/11/20 233 lb (105.7 kg)  04/07/20 243 lb (110.2 kg)  10/13/19 237 lb (107.5 kg)    Physical Exam Vitals and nursing note reviewed.  Constitutional:      General: He is not in acute distress.    Appearance: Normal appearance. He is normal weight. He is not ill-appearing, toxic-appearing or diaphoretic.  HENT:     Head: Normocephalic and atraumatic.     Right Ear: Tympanic membrane, ear canal and external ear normal. There is no impacted cerumen.     Left Ear: Tympanic membrane, ear canal and external ear normal. There is no impacted cerumen.     Nose: Nose normal. No congestion or rhinorrhea.  Mouth/Throat:     Mouth: Mucous membranes are moist.     Pharynx: Oropharynx is clear. No oropharyngeal exudate or posterior oropharyngeal erythema.  Eyes:     General: No scleral icterus.       Right eye: No discharge.        Left eye: No discharge.     Extraocular Movements: Extraocular movements intact.     Conjunctiva/sclera: Conjunctivae normal.     Pupils: Pupils are equal, round, and reactive to light.  Neck:     Vascular: No carotid bruit.  Cardiovascular:     Rate and Rhythm: Normal rate and regular rhythm.     Pulses: Normal pulses.     Heart sounds: No murmur heard.   No friction rub. No gallop.  Pulmonary:     Effort: Pulmonary effort is normal. No respiratory distress.     Breath sounds: Normal breath sounds. No stridor. No wheezing, rhonchi or rales.  Chest:     Chest wall: No tenderness.  Abdominal:     General: Abdomen is flat. Bowel sounds are normal. There is no distension.     Palpations: Abdomen is soft. There is no mass.     Tenderness: There is no abdominal tenderness. There is no right CVA tenderness, left CVA tenderness, guarding or rebound.     Hernia: No hernia is present.  Genitourinary:    Comments: Genital exam deferred with shared decision making Musculoskeletal:         General: No swelling, tenderness, deformity or signs of injury.     Cervical back: Normal range of motion and neck supple. No rigidity. No muscular tenderness.     Right lower leg: No edema.     Left lower leg: No edema.  Lymphadenopathy:     Cervical: No cervical adenopathy.  Skin:    General: Skin is warm and dry.     Capillary Refill: Capillary refill takes less than 2 seconds.     Coloration: Skin is not jaundiced or pale.     Findings: No bruising, erythema, lesion or rash.  Neurological:     General: No focal deficit present.     Mental Status: He is alert and oriented to person, place, and time.     Cranial Nerves: No cranial nerve deficit.     Sensory: No sensory deficit.     Motor: No weakness.     Coordination: Coordination normal.     Gait: Gait normal.     Deep Tendon Reflexes: Reflexes normal.  Psychiatric:        Mood and Affect: Mood normal.        Behavior: Behavior normal.        Thought Content: Thought content normal.        Judgment: Judgment normal.    Results for orders placed or performed in visit on 04/07/20  Uric acid  Result Value Ref Range   Uric Acid 7.6 3.8 - 8.4 mg/dL  CBC with Differential/Platelet  Result Value Ref Range   WBC 7.0 3.4 - 10.8 x10E3/uL   RBC 5.74 4.14 - 5.80 x10E6/uL   Hemoglobin 16.3 13.0 - 17.7 g/dL   Hematocrit 49.4 37.5 - 51.0 %   MCV 86 79 - 97 fL   MCH 28.4 26.6 - 33.0 pg   MCHC 33.0 31.5 - 35.7 g/dL   RDW 13.4 11.6 - 15.4 %   Platelets 238 150 - 450 x10E3/uL   Neutrophils 70 Not Estab. %   Lymphs 18 Not  Estab. %   Monocytes 8 Not Estab. %   Eos 3 Not Estab. %   Basos 1 Not Estab. %   Neutrophils Absolute 4.9 1.4 - 7.0 x10E3/uL   Lymphocytes Absolute 1.2 0.7 - 3.1 x10E3/uL   Monocytes Absolute 0.6 0.1 - 0.9 x10E3/uL   EOS (ABSOLUTE) 0.2 0.0 - 0.4 x10E3/uL   Basophils Absolute 0.1 0.0 - 0.2 x10E3/uL   Immature Granulocytes 0 Not Estab. %   Immature Grans (Abs) 0.0 0.0 - 0.1 x10E3/uL  Comprehensive  metabolic panel  Result Value Ref Range   Glucose 137 (H) 65 - 99 mg/dL   BUN 13 8 - 27 mg/dL   Creatinine, Ser 1.48 (H) 0.76 - 1.27 mg/dL   eGFR 50 (L) >59 mL/min/1.73   BUN/Creatinine Ratio 9 (L) 10 - 24   Sodium 140 134 - 144 mmol/L   Potassium 4.3 3.5 - 5.2 mmol/L   Chloride 101 96 - 106 mmol/L   CO2 23 20 - 29 mmol/L   Calcium 9.3 8.6 - 10.2 mg/dL   Total Protein 7.1 6.0 - 8.5 g/dL   Albumin 4.4 3.7 - 4.7 g/dL   Globulin, Total 2.7 1.5 - 4.5 g/dL   Albumin/Globulin Ratio 1.6 1.2 - 2.2   Bilirubin Total 0.3 0.0 - 1.2 mg/dL   Alkaline Phosphatase 64 44 - 121 IU/L   AST 17 0 - 40 IU/L   ALT 16 0 - 44 IU/L  Lipid Panel w/o Chol/HDL Ratio  Result Value Ref Range   Cholesterol, Total 130 100 - 199 mg/dL   Triglycerides 211 (H) 0 - 149 mg/dL   HDL 28 (L) >39 mg/dL   VLDL Cholesterol Cal 35 5 - 40 mg/dL   LDL Chol Calc (NIH) 67 0 - 99 mg/dL  TSH  Result Value Ref Range   TSH 3.470 0.450 - 4.500 uIU/mL      Assessment & Plan:   Problem List Items Addressed This Visit       Cardiovascular and Mediastinum   HTN (hypertension)    Under good control on current regimen. Continue current regimen. Continue to monitor. Call with any concerns. Refills given. Labs drawn today.       Relevant Medications   rosuvastatin (CRESTOR) 5 MG tablet   lisinopril (ZESTRIL) 10 MG tablet   aspirin EC 81 MG tablet   Other Relevant Orders   Comprehensive metabolic panel   CBC with Differential/Platelet   Urinalysis, Routine w reflex microscopic   Microalbumin, Urine Waived   Senile purpura (HCC)    Reassured patient. Continue to monitor.       Relevant Medications   rosuvastatin (CRESTOR) 5 MG tablet   lisinopril (ZESTRIL) 10 MG tablet   aspirin EC 81 MG tablet   Other Relevant Orders   Comprehensive metabolic panel   CBC with Differential/Platelet   Urinalysis, Routine w reflex microscopic     Endocrine   Hypothyroidism    Rechecking labs today. Await results. Treat as needed.        Relevant Orders   Comprehensive metabolic panel   CBC with Differential/Platelet   TSH   Urinalysis, Routine w reflex microscopic   Controlled type 2 diabetes mellitus with complication, without long-term current use of insulin ([place])    Newly diagnosed with A1c of 6.7 at outside facility. A1c down to 5.7- continue current regimen. Continue to monitor. Call with any concerns.       Relevant Medications   rosuvastatin (CRESTOR) 5 MG tablet   metFORMIN (  GLUCOPHAGE) 500 MG tablet   lisinopril (ZESTRIL) 10 MG tablet   aspirin EC 81 MG tablet     Genitourinary   Chronic kidney disease, stage 3a ([place])    Rechecking labs today. Await results. Treat as needed.       Relevant Orders   Comprehensive metabolic panel   CBC with Differential/Platelet   Urinalysis, Routine w reflex microscopic     Other   Hyperlipidemia    Under good control on current regimen. Continue current regimen. Continue to monitor. Call with any concerns. Refills given. Labs drawn today.       Relevant Medications   rosuvastatin (CRESTOR) 5 MG tablet   lisinopril (ZESTRIL) 10 MG tablet   aspirin EC 81 MG tablet   Other Relevant Orders   Comprehensive metabolic panel   CBC with Differential/Platelet   Lipid Panel w/o Chol/HDL Ratio   Urinalysis, Routine w reflex microscopic   Elevated uric acid in blood    Rechecking labs today. Await results. Treat as needed.       Relevant Orders   Comprehensive metabolic panel   CBC with Differential/Platelet   Urinalysis, Routine w reflex microscopic   Uric acid   Other Visit Diagnoses     Routine general medical examination at a health care facility    -  Primary   Vaccines declined. Screening labs checked today. Colonoscopy up to date. Continue diet and exercise. Call with any concerns.    Hyperglycemia       Labs drawn today. Await results.    Relevant Orders   Bayer DCA Hb A1c Waived   Screening for prostate cancer       Labs drawn today. Await  results.    Relevant Orders   PSA        Discussed aspirin prophylaxis for myocardial infarction prevention and decision was made to continue ASA  LABORATORY TESTING:  Health maintenance labs ordered today as discussed above.   The natural history of prostate cancer and ongoing controversy regarding screening and potential treatment outcomes of prostate cancer has been discussed with the patient. The meaning of a false positive PSA and a false negative PSA has been discussed. He indicates understanding of the limitations of this screening test and wishes to proceed with screening PSA testing.   IMMUNIZATIONS:   - Tdap: Tetanus vaccination status reviewed: Declined. - Influenza: Refused - Pneumovax: Refused - Prevnar: Refused - COVID: Up to date - Shingrix vaccine: Refused  SCREENING: - Colonoscopy: Up to date  Discussed with patient purpose of the colonoscopy is to detect colon cancer at curable precancerous or early stages   PATIENT COUNSELING:    Sexuality: Discussed sexually transmitted diseases, partner selection, use of condoms, avoidance of unintended pregnancy  and contraceptive alternatives.   Advised to avoid cigarette smoking.  I discussed with the patient that most people either abstain from alcohol or drink within safe limits (<=14/week and <=4 drinks/occasion for males, <=7/weeks and <= 3 drinks/occasion for females) and that the risk for alcohol disorders and other health effects rises proportionally with the number of drinks per week and how often a drinker exceeds daily limits.  Discussed cessation/primary prevention of drug use and availability of treatment for abuse.   Diet: Encouraged to adjust caloric intake to maintain  or achieve ideal body weight, to reduce intake of dietary saturated fat and total fat, to limit sodium intake by avoiding high sodium foods and not adding table salt, and to maintain adequate dietary potassium and  calcium preferably from fresh  fruits, vegetables, and low-fat dairy products.    stressed the importance of regular exercise  Injury prevention: Discussed safety belts, safety helmets, smoke detector, smoking near bedding or upholstery.   Dental health: Discussed importance of regular tooth brushing, flossing, and dental visits.   Follow up plan: NEXT PREVENTATIVE PHYSICAL DUE IN 1 YEAR. Return in about 6 months (around 04/10/2021).

## 2020-10-11 NOTE — Assessment & Plan Note (Signed)
Newly diagnosed with A1c of 6.7 at outside facility. A1c down to 5.7- continue current regimen. Continue to monitor. Call with any concerns.

## 2020-10-12 LAB — CBC WITH DIFFERENTIAL/PLATELET
Basophils Absolute: 0.1 10*3/uL (ref 0.0–0.2)
Basos: 1 %
EOS (ABSOLUTE): 0.2 10*3/uL (ref 0.0–0.4)
Eos: 3 %
Hematocrit: 48 % (ref 37.5–51.0)
Hemoglobin: 16 g/dL (ref 13.0–17.7)
Immature Grans (Abs): 0 10*3/uL (ref 0.0–0.1)
Immature Granulocytes: 0 %
Lymphocytes Absolute: 1.3 10*3/uL (ref 0.7–3.1)
Lymphs: 18 %
MCH: 28.9 pg (ref 26.6–33.0)
MCHC: 33.3 g/dL (ref 31.5–35.7)
MCV: 87 fL (ref 79–97)
Monocytes Absolute: 0.5 10*3/uL (ref 0.1–0.9)
Monocytes: 8 %
Neutrophils Absolute: 4.9 10*3/uL (ref 1.4–7.0)
Neutrophils: 70 %
Platelets: 206 10*3/uL (ref 150–450)
RBC: 5.53 x10E6/uL (ref 4.14–5.80)
RDW: 13.2 % (ref 11.6–15.4)
WBC: 7 10*3/uL (ref 3.4–10.8)

## 2020-10-12 LAB — COMPREHENSIVE METABOLIC PANEL
ALT: 19 IU/L (ref 0–44)
AST: 18 IU/L (ref 0–40)
Albumin/Globulin Ratio: 1.8 (ref 1.2–2.2)
Albumin: 4.4 g/dL (ref 3.7–4.7)
Alkaline Phosphatase: 57 IU/L (ref 44–121)
BUN/Creatinine Ratio: 11 (ref 10–24)
BUN: 17 mg/dL (ref 8–27)
Bilirubin Total: 0.4 mg/dL (ref 0.0–1.2)
CO2: 23 mmol/L (ref 20–29)
Calcium: 9.7 mg/dL (ref 8.6–10.2)
Chloride: 104 mmol/L (ref 96–106)
Creatinine, Ser: 1.48 mg/dL — ABNORMAL HIGH (ref 0.76–1.27)
Globulin, Total: 2.4 g/dL (ref 1.5–4.5)
Glucose: 121 mg/dL — ABNORMAL HIGH (ref 70–99)
Potassium: 4.8 mmol/L (ref 3.5–5.2)
Sodium: 141 mmol/L (ref 134–144)
Total Protein: 6.8 g/dL (ref 6.0–8.5)
eGFR: 50 mL/min/{1.73_m2} — ABNORMAL LOW (ref >59)

## 2020-10-12 LAB — LIPID PANEL W/O CHOL/HDL RATIO
Cholesterol, Total: 133 mg/dL (ref 100–199)
HDL: 33 mg/dL — ABNORMAL LOW (ref >39)
LDL Chol Calc (NIH): 75 mg/dL (ref 0–99)
Triglycerides: 139 mg/dL (ref 0–149)
VLDL Cholesterol Cal: 25 mg/dL (ref 5–40)

## 2020-10-12 LAB — PSA: Prostate Specific Ag, Serum: 2.1 ng/mL (ref 0.0–4.0)

## 2020-10-12 LAB — URIC ACID: Uric Acid: 8.9 mg/dL — ABNORMAL HIGH (ref 3.8–8.4)

## 2020-10-12 LAB — BAYER DCA HB A1C WAIVED: HB A1C (BAYER DCA - WAIVED): 5.7 % — ABNORMAL HIGH (ref 4.8–5.6)

## 2020-10-12 LAB — TSH: TSH: 2.73 u[IU]/mL (ref 0.450–4.500)

## 2020-10-13 ENCOUNTER — Ambulatory Visit: Payer: Medicare Other

## 2020-10-13 LAB — URINALYSIS, ROUTINE W REFLEX MICROSCOPIC
Bilirubin, UA: NEGATIVE
Glucose, UA: NEGATIVE
Ketones, UA: NEGATIVE
Nitrite, UA: POSITIVE — AB
Protein,UA: NEGATIVE
Specific Gravity, UA: 1.015 (ref 1.005–1.030)
Urobilinogen, Ur: 0.2 mg/dL (ref 0.2–1.0)
pH, UA: 5 (ref 5.0–7.5)

## 2020-10-13 LAB — MICROSCOPIC EXAMINATION

## 2020-10-13 LAB — MICROALBUMIN, URINE WAIVED
Creatinine, Urine Waived: 100 mg/dL (ref 10–300)
Microalb, Ur Waived: 30 mg/L — ABNORMAL HIGH (ref 0–19)
Microalb/Creat Ratio: <30 mg/g (ref <30)

## 2020-10-15 ENCOUNTER — Ambulatory Visit (INDEPENDENT_AMBULATORY_CARE_PROVIDER_SITE_OTHER): Payer: Medicare Other

## 2020-10-15 ENCOUNTER — Telehealth: Payer: Self-pay

## 2020-10-15 ENCOUNTER — Other Ambulatory Visit: Payer: Self-pay | Admitting: Family Medicine

## 2020-10-15 VITALS — Ht 74.0 in | Wt 232.0 lb

## 2020-10-15 DIAGNOSIS — Z0000 Encounter for general adult medical examination without abnormal findings: Secondary | ICD-10-CM | POA: Diagnosis not present

## 2020-10-15 MED ORDER — CIPROFLOXACIN HCL 500 MG PO TABS: 500.0000 mg | ORAL_TABLET | Freq: Two times a day (BID) | ORAL | 0 refills | Status: DC

## 2020-10-15 NOTE — Patient Instructions (Signed)
Kenneth Johnston , Thank you for taking time to come for your Medicare Wellness Visit. I appreciate your ongoing commitment to your health goals. Please review the following plan we discussed and let me know if I can assist you in the future.   Screening recommendations/referrals: Colonoscopy: completed 06/02/2016 Recommended yearly ophthalmology/optometry visit for glaucoma screening and checkup Recommended yearly dental visit for hygiene and checkup  Vaccinations: Influenza vaccine: decline Pneumococcal vaccine: decline Tdap vaccine: due Shingles vaccine: discussed   Covid-19:  10/03/2019  Advanced directives: Please bring a copy of your POA (Power of Attorney) and/or Living Will to your next appointment.   Conditions/risks identified: none  Next appointment: Follow up in one year for your annual wellness visit.   Preventive Care 72 Years and Older, Male Preventive care refers to lifestyle choices and visits with your health care provider that can promote health and wellness. What does preventive care include? A yearly physical exam. This is also called an annual well check. Dental exams once or twice a year. Routine eye exams. Ask your health care provider how often you should have your eyes checked. Personal lifestyle choices, including: Daily care of your teeth and gums. Regular physical activity. Eating a healthy diet. Avoiding tobacco and drug use. Limiting alcohol use. Practicing safe sex. Taking low doses of aspirin every day. Taking vitamin and mineral supplements as recommended by your health care provider. What happens during an annual well check? The services and screenings done by your health care provider during your annual well check will depend on your age, overall health, lifestyle risk factors, and family history of disease. Counseling  Your health care provider may ask you questions about your: Alcohol use. Tobacco use. Drug use. Emotional well-being. Home and  relationship well-being. Sexual activity. Eating habits. History of falls. Memory and ability to understand (cognition). Work and work Astronomer. Screening  You may have the following tests or measurements: Height, weight, and BMI. Blood pressure. Lipid and cholesterol levels. These may be checked every 5 years, or more frequently if you are over 72 years old. Skin check. Lung cancer screening. You may have this screening every year starting at age 72 if you have a 30-pack-year history of smoking and currently smoke or have quit within the past 15 years. Fecal occult blood test (FOBT) of the stool. You may have this test every year starting at age 72. Flexible sigmoidoscopy or colonoscopy. You may have a sigmoidoscopy every 5 years or a colonoscopy every 10 years starting at age 72. Prostate cancer screening. Recommendations will vary depending on your family history and other risks. Hepatitis C blood test. Hepatitis B blood test. Sexually transmitted disease (STD) testing. Diabetes screening. This is done by checking your blood sugar (glucose) after you have not eaten for a while (fasting). You may have this done every 1-3 years. Abdominal aortic aneurysm (AAA) screening. You may need this if you are a current or former smoker. Osteoporosis. You may be screened starting at age 72 if you are at high risk. Talk with your health care provider about your test results, treatment options, and if necessary, the need for more tests. Vaccines  Your health care provider may recommend certain vaccines, such as: Influenza vaccine. This is recommended every year. Tetanus, diphtheria, and acellular pertussis (Tdap, Td) vaccine. You may need a Td booster every 10 years. Zoster vaccine. You may need this after age 18. Pneumococcal 13-valent conjugate (PCV13) vaccine. One dose is recommended after age 65. Pneumococcal polysaccharide (PPSV23) vaccine.  One dose is recommended after age 25. Talk to your  health care provider about which screenings and vaccines you need and how often you need them. This information is not intended to replace advice given to you by your health care provider. Make sure you discuss any questions you have with your health care provider. Document Released: 01/29/2015 Document Revised: 09/22/2015 Document Reviewed: 11/03/2014 Elsevier Interactive Patient Education  2017 [place] Prevention in the Home Falls can cause injuries. They can happen to people of all ages. There are many things you can do to make your home safe and to help prevent falls. What can I do on the outside of my home? Regularly fix the edges of walkways and driveways and fix any cracks. Remove anything that might make you trip as you walk through a door, such as a raised step or threshold. Trim any bushes or trees on the path to your home. Use bright outdoor lighting. Clear any walking paths of anything that might make someone trip, such as rocks or tools. Regularly check to see if handrails are loose or broken. Make sure that both sides of any steps have handrails. Any raised decks and porches should have guardrails on the edges. Have any leaves, snow, or ice cleared regularly. Use sand or salt on walking paths during winter. Clean up any spills in your garage right away. This includes oil or grease spills. What can I do in the bathroom? Use night lights. Install grab bars by the toilet and in the tub and shower. Do not use towel bars as grab bars. Use non-skid mats or decals in the tub or shower. If you need to sit down in the shower, use a plastic, non-slip stool. Keep the floor dry. Clean up any water that spills on the floor as soon as it happens. Remove soap buildup in the tub or shower regularly. Attach bath mats securely with double-sided non-slip rug tape. Do not have throw rugs and other things on the floor that can make you trip. What can I do in the bedroom? Use night  lights. Make sure that you have a light by your bed that is easy to reach. Do not use any sheets or blankets that are too big for your bed. They should not hang down onto the floor. Have a firm chair that has side arms. You can use this for support while you get dressed. Do not have throw rugs and other things on the floor that can make you trip. What can I do in the kitchen? Clean up any spills right away. Avoid walking on wet floors. Keep items that you use a lot in easy-to-reach places. If you need to reach something above you, use a strong step stool that has a grab bar. Keep electrical cords out of the way. Do not use floor polish or wax that makes floors slippery. If you must use wax, use non-skid floor wax. Do not have throw rugs and other things on the floor that can make you trip. What can I do with my stairs? Do not leave any items on the stairs. Make sure that there are handrails on both sides of the stairs and use them. Fix handrails that are broken or loose. Make sure that handrails are as long as the stairways. Check any carpeting to make sure that it is firmly attached to the stairs. Fix any carpet that is loose or worn. Avoid having throw rugs at the top or bottom of  the stairs. If you do have throw rugs, attach them to the floor with carpet tape. Make sure that you have a light switch at the top of the stairs and the bottom of the stairs. If you do not have them, ask someone to add them for you. What else can I do to help prevent falls? Wear shoes that: Do not have high heels. Have rubber bottoms. Are comfortable and fit you well. Are closed at the toe. Do not wear sandals. If you use a stepladder: Make sure that it is fully opened. Do not climb a closed stepladder. Make sure that both sides of the stepladder are locked into place. Ask someone to hold it for you, if possible. Clearly mark and make sure that you can see: Any grab bars or handrails. First and last  steps. Where the edge of each step is. Use tools that help you move around (mobility aids) if they are needed. These include: Canes. Walkers. Scooters. Crutches. Turn on the lights when you go into a dark area. Replace any light bulbs as soon as they burn out. Set up your furniture so you have a clear path. Avoid moving your furniture around. If any of your floors are uneven, fix them. If there are any pets around you, be aware of where they are. Review your medicines with your doctor. Some medicines can make you feel dizzy. This can increase your chance of falling. Ask your doctor what other things that you can do to help prevent falls. This information is not intended to replace advice given to you by your health care provider. Make sure you discuss any questions you have with your health care provider. Document Released: 10/29/2008 Document Revised: 06/10/2015 Document Reviewed: 02/06/2014 Elsevier Interactive Patient Education  2017 [employer].

## 2020-10-15 NOTE — Progress Notes (Signed)
I connected with [name] today by telephone and verified that I am speaking with the correct person using two identifiers. Location patient: home Location provider: work Persons participating in the virtual visit: [name], [name].   I discussed the limitations, risks, security and privacy concerns of performing an evaluation and management service by telephone and the availability of in person appointments. I also discussed with the patient that there may be a patient responsible charge related to this service. The patient expressed understanding and verbally consented to this telephonic visit.    Interactive audio and video telecommunications were attempted between this provider and patient, however failed, due to patient having technical difficulties OR patient did not have access to video capability.  We continued and completed visit with audio only.     Vital signs may be patient reported or missing.  Subjective:   Kenneth Johnston is a 70 y.o. male who presents for Medicare Annual/Subsequent preventive examination.  Review of Systems     Cardiac Risk Factors include: advanced age (>71men, >101 women);diabetes mellitus;male gender     Objective:    Today's Vitals   10/15/20 1256  Weight: 232 lb (105.2 kg)  Height: 6\' 2"  (1.88 m)   Body mass index is 29.79 kg/m.  Advanced Directives 10/15/2020 10/13/2019 05/17/2017 05/16/2016  Does Patient Have a Medical Advance Directive? Yes Yes Yes No  Type of Estate agent of [place];Living will Healthcare Power of [place];Living will Living will;Healthcare Power of Attorney -  Copy of Healthcare Power of Attorney in Chart? No - copy requested No - copy requested No - copy requested -  Would patient like information on creating a medical advance directive? - - - Yes (MAU/Ambulatory/Procedural Areas - Information given)    Current Medications (verified) Outpatient Encounter Medications as of  10/15/2020  Medication Sig   aspirin EC 81 MG tablet Take 1 tablet (81 mg total) by mouth daily.   cyclobenzaprine (FLEXERIL) 10 MG tablet Take 1 tablet (10 mg total) by mouth at bedtime.   doxycycline (MONODOX) 100 MG capsule Take 100 mg by mouth 2 (two) times daily.   levothyroxine (SYNTHROID) 75 MCG tablet TAKE 1 TABLET(75 MCG) BY MOUTH DAILY BEFORE BREAKFAST   meloxicam (MOBIC) 15 MG tablet TAKE 1 TABLET(15 MG) BY MOUTH DAILY   metFORMIN (GLUCOPHAGE) 500 MG tablet Take 1 tablet (500 mg total) by mouth daily.   omeprazole (PRILOSEC) 40 MG capsule Take 1 capsule (40 mg total) by mouth daily.   rosuvastatin (CRESTOR) 5 MG tablet Take 1 tablet (5 mg total) by mouth daily at 6 PM.   lisinopril (ZESTRIL) 10 MG tablet Take 1 tablet (10 mg total) by mouth daily. (Patient not taking: Reported on 10/15/2020)   No facility-administered encounter medications on file as of 10/15/2020.    Allergies (verified) Patient has no known allergies.   History: Past Medical History:  Diagnosis Date   Allergy    Arthritis    CKD (chronic kidney disease), stage III (HCC)    Coronary artery calcification    Hyperlipidemia    Hypertension    Hypothyroidism    Umbilical hernia 03/04/2015   Past Surgical History:  Procedure Laterality Date   KNEE CARTILAGE SURGERY Bilateral    Family History  Problem Relation Age of Onset   Kidney failure Mother    Aneurysm Father        brain   Kidney disease Paternal Grandmother    Heart attack Paternal Grandfather  Social History   Socioeconomic History   Marital status: Married    Spouse name: Not on file   Number of children: Not on file   Years of education: Not on file   Highest education level: Not on file  Occupational History   Not on file  Tobacco Use   Smoking status: Former    Types: Cigarettes    Quit date: 03/04/1995    Years since quitting: 25.6   Smokeless tobacco: Never  Vaping Use   Vaping Use: Never used  Substance and Sexual  Activity   Alcohol use: Yes    Comment: very seldom    Drug use: No   Sexual activity: Not Currently  Other Topics Concern   Not on file  Social History Narrative   [place]    Social Determinants of Health   Financial Resource Strain: Low Risk    Difficulty of Paying Living Expenses: Not hard at all  Food Insecurity: No Food Insecurity   Worried About Programme researcher, broadcasting/film/video in the Last Year: Never true   Ran Out of Food in the Last Year: Never true  Transportation Needs: No Transportation Needs   Lack of Transportation (Medical): No   Lack of Transportation (Non-Medical): No  Physical Activity: Sufficiently Active   Days of Exercise per Week: 3 days   Minutes of Exercise per Session: 60 min  Stress: No Stress Concern Present   Feeling of Stress : Not at all  Social Connections: Not on file    Tobacco Counseling Counseling given: Not Answered   Clinical Intake:  Pre-visit preparation completed: Yes  Pain : No/denies pain     Nutritional Status: BMI 25 -29 Overweight Nutritional Risks: None Diabetes: Yes  How often do you need to have someone help you when you read instructions, pamphlets, or other written materials from your doctor or pharmacy?: 1 - Never What is the last grade level you completed in school?: 12th grade  Diabetic? Yes Nutrition Risk Assessment:  Has the patient had any N/V/D within the last 2 months?  No  Does the patient have any non-healing wounds?  No  Has the patient had any unintentional weight loss or weight gain?  No   Diabetes:  Is the patient diabetic?  Yes  If diabetic, was a CBG obtained today?  No  Did the patient bring in their glucometer from home?  No  How often do you monitor your CBG's? Every two weeks.   Financial Strains and Diabetes Management:  Are you having any financial strains with the device, your supplies or your medication? No .  Does the patient want to be seen by Chronic Care Management for management of their  diabetes?  No  Would the patient like to be referred to a Nutritionist or for Diabetic Management?  No   Diabetic Exams:  Diabetic Eye Exam: Overdue for diabetic eye exam. Pt has been advised about the importance in completing this exam. Patient advised to call and schedule an eye exam. Diabetic Foot Exam: Completed 10/11/2020   Interpreter Needed?: No  Information entered by :: NAllen LPN   Activities of Daily Living In your present state of health, do you have any difficulty performing the following activities: 10/15/2020  Hearing? N  Vision? N  Difficulty concentrating or making decisions? N  Walking or climbing stairs? N  Dressing or bathing? N  Doing errands, shopping? N  Preparing Food and eating ? N  Using the Toilet? N  In the  past six months, have you accidently leaked urine? N  Do you have problems with loss of bowel control? N  Managing your Medications? N  Managing your Finances? N  Housekeeping or managing your Housekeeping? N  Some recent data might be hidden    Patient Care Team: [name], DO as PCP - General (Family Medicine) [name], MD as PCP - Cardiology (Cardiology) Marlowe Sax, RN as Case Manager (General Practice) [facility], [facility])  Indicate any recent Medical Services you may have received from other than [facility] providers in the past year (date may be approximate).     Assessment:   This is a routine wellness examination for [facility].  Hearing/Vision screen Vision Screening - Comments:: Regular eye exams, Dr. Loreta Ave  Dietary issues and exercise activities discussed: Current Exercise Habits: Home exercise routine, Type of exercise: strength training/weights, Time (Minutes): 60, Frequency (Times/Week): 3, Weekly Exercise (Minutes/Week): 180   Goals Addressed             This Visit's Progress    Patient Stated       10/15/2020, wants to lose 4-5 pounds       Depression Screen PHQ 2/9 Scores 10/15/2020  10/13/2019 10/09/2019 10/04/2018 05/17/2017 05/16/2016 04/30/2015  PHQ - 2 Score 0 0 0 0 0 0 0  PHQ- 9 Score - - 0 - - - -    Fall Risk Fall Risk  10/15/2020 10/13/2019 10/09/2019 10/04/2018 02/05/2018  Falls in the past year? 0 0 0 0 0  Number falls in past yr: - - 0 0 -  Injury with Fall? - - - 0 -  Risk for fall due to : Medication side effect Medication side effect - - -  Follow up Falls evaluation completed;Education provided;Falls prevention discussed Falls evaluation completed;Education provided;Falls prevention discussed - - Falls evaluation completed    FALL RISK PREVENTION PERTAINING TO THE HOME:  Any stairs in or around the home? Yes  If so, are there any without handrails? No  Home free of loose throw rugs in walkways, pet beds, electrical cords, etc? Yes  Adequate lighting in your home to reduce risk of falls? Yes   ASSISTIVE DEVICES UTILIZED TO PREVENT FALLS:  Life alert? No  Use of a cane, walker or w/c? No  Grab bars in the bathroom? Yes  Shower chair or bench in shower? No  Elevated toilet seat or a handicapped toilet? No   TIMED UP AND GO:  Was the test performed? No .      Cognitive Function:     6CIT Screen 10/15/2020 10/13/2019 10/04/2018 05/17/2017 05/16/2016  What Year? 0 points 0 points 0 points 0 points 0 points  What month? 0 points 0 points 0 points 0 points 0 points  What time? 0 points 0 points 0 points 0 points 0 points  Count back from 20 0 points 0 points 0 points 0 points 0 points  Months in reverse 0 points 0 points 0 points 0 points 0 points  Repeat phrase 2 points 0 points 2 points 0 points 4 points  Total Score 2 0 2 0 4    Immunizations Immunization History  Administered Date(s) Administered   PFIZER(Purple Top)SARS-COV-2 Vaccination 10/03/2019    TDAP status: Due, Education has been provided regarding the importance of this vaccine. Advised may receive this vaccine at local pharmacy or Health Dept. Aware to provide a copy of the vaccination  record if obtained from local pharmacy or Health Dept.  Verbalized acceptance and understanding.  Flu Vaccine status: Declined, Education has been provided regarding the importance of this vaccine but patient still declined. Advised may receive this vaccine at local pharmacy or Health Dept. Aware to provide a copy of the vaccination record if obtained from local pharmacy or Health Dept. Verbalized acceptance and understanding.  Pneumococcal vaccine status: Declined,  Education has been provided regarding the importance of this vaccine but patient still declined. Advised may receive this vaccine at local pharmacy or Health Dept. Aware to provide a copy of the vaccination record if obtained from local pharmacy or Health Dept. Verbalized acceptance and understanding.   Covid-19 vaccine status: Completed vaccines  Qualifies for Shingles Vaccine? Yes   Zostavax completed No   Shingrix Completed?: No.    Education has been provided regarding the importance of this vaccine. Patient has been advised to call insurance company to determine out of pocket expense if they have not yet received this vaccine. Advised may also receive vaccine at local pharmacy or Health Dept. Verbalized acceptance and understanding.  Screening Tests Health Maintenance  Topic Date Due   OPHTHALMOLOGY EXAM  Never done   Zoster Vaccines- Shingrix (1 of 2) Never done   INFLUENZA VACCINE  Never done   COVID-19 Vaccine (2 - Pfizer risk series) 10/27/2020 (Originally 10/24/2019)   TETANUS/TDAP  04/07/2021 (Originally 12/29/1969)   HEMOGLOBIN A1C  04/10/2021   FOOT EXAM  10/11/2021   COLONOSCOPY (Pts 45-23yrs Insurance coverage will need to be confirmed)  06/03/2026   Hepatitis C Screening  Completed   HPV VACCINES  Aged Out    Health Maintenance  Health Maintenance Due  Topic Date Due   OPHTHALMOLOGY EXAM  Never done   Zoster Vaccines- Shingrix (1 of 2) Never done   INFLUENZA VACCINE  Never done    Colorectal cancer  screening: Type of screening: Colonoscopy. Completed 06/02/2016. Repeat every 10 years  Lung Cancer Screening: (Low Dose CT Chest recommended if Age 63-80 years, 30 pack-year currently smoking OR have quit w/in 15years.) does not qualify.   Lung Cancer Screening Referral: no  Additional Screening:  Hepatitis C Screening: does qualify; Completed 03/04/2015  Vision Screening: Recommended annual ophthalmology exams for early detection of glaucoma and other disorders of the eye. Is the patient up to date with their annual eye exam?  No  Who is the provider or what is the name of the office in which the patient attends annual eye exams? Dr. Loreta Ave If pt is not established with a provider, would they like to be referred to a provider to establish care? No .   Dental Screening: Recommended annual dental exams for proper oral hygiene  Community Resource Referral / Chronic Care Management: CRR required this visit?  No   CCM required this visit?  No      Plan:     I have personally reviewed and noted the following in the patient's chart:   Medical and social history Use of alcohol, tobacco or illicit drugs  Current medications and supplements including opioid prescriptions. Patient is not currently taking opioid prescriptions. Functional ability and status Nutritional status Physical activity Advanced directives List of other physicians Hospitalizations, surgeries, and ER visits in previous 12 months Vitals Screenings to include cognitive, depression, and falls Referrals and appointments  In addition, I have reviewed and discussed with patient certain preventive protocols, quality metrics, and best practice recommendations. A written personalized care plan for preventive services as well as general preventive health recommendations were provided to  patient.     Barb Merino, LPN   [id number]/45/[id number]   Nurse Notes:

## 2020-10-15 NOTE — Telephone Encounter (Signed)
Left message informing patient of results and recommendations. 

## 2020-10-15 NOTE — Telephone Encounter (Signed)
-----   Message from [name], [place] sent at 10/15/2020  3:15 PM EDT ----- Please let him know that his urine looks like he has a UTI so I've sent him through an antibiotic. Thanks!

## 2020-11-15 ENCOUNTER — Other Ambulatory Visit: Payer: Self-pay | Admitting: Family Medicine

## 2020-11-15 MED ORDER — CYCLOBENZAPRINE HCL 10 MG PO TABS: 10.0000 mg | ORAL_TABLET | Freq: Every day | ORAL | 0 refills | Status: DC

## 2020-11-23 DIAGNOSIS — M545 Low back pain, unspecified: Secondary | ICD-10-CM | POA: Diagnosis not present

## 2020-11-23 DIAGNOSIS — M9904 Segmental and somatic dysfunction of sacral region: Secondary | ICD-10-CM | POA: Diagnosis not present

## 2020-11-23 DIAGNOSIS — M9903 Segmental and somatic dysfunction of lumbar region: Secondary | ICD-10-CM | POA: Diagnosis not present

## 2020-11-23 DIAGNOSIS — M62830 Muscle spasm of back: Secondary | ICD-10-CM | POA: Diagnosis not present

## 2021-01-07 ENCOUNTER — Other Ambulatory Visit: Payer: Self-pay | Admitting: Family Medicine

## 2021-01-07 MED ORDER — CYCLOBENZAPRINE HCL 10 MG PO TABS: 10.0000 mg | ORAL_TABLET | Freq: Every day | ORAL | 0 refills | Status: DC

## 2021-01-10 ENCOUNTER — Other Ambulatory Visit: Payer: Self-pay | Admitting: Nurse Practitioner

## 2021-01-10 ENCOUNTER — Other Ambulatory Visit: Payer: Self-pay | Admitting: Family Medicine

## 2021-01-11 MED ORDER — CYCLOBENZAPRINE HCL 10 MG PO TABS: 10.0000 mg | ORAL_TABLET | Freq: Every day | ORAL | 0 refills | Status: DC

## 2021-01-11 NOTE — Telephone Encounter (Signed)
Transmission to pharmacy failed due to power outage last week. Can we resend please?

## 2021-01-11 NOTE — Telephone Encounter (Signed)
Requested Prescriptions  Refused Prescriptions Disp Refills   lisinopril (ZESTRIL) 10 MG tablet [Pharmacy Med Name: LISINOPRIL 10MG  TABLETS] 90 tablet 1    Sig: TAKE 1 TABLET(10 MG) BY MOUTH DAILY     Cardiovascular:  ACE Inhibitors Failed - 01/10/2021  8:07 AM      Failed - Cr in normal range and within 180 days    Creatinine, Ser  Date Value Ref Range Status  10/11/2020 1.48 (H) 0.76 - 1.27 mg/dL Final         Passed - K in normal range and within 180 days    Potassium  Date Value Ref Range Status  10/11/2020 4.8 3.5 - 5.2 mmol/L Final         Passed - Patient is not pregnant      Passed - Last BP in normal range    BP Readings from Last 1 Encounters:  10/11/20 137/87         Passed - Valid encounter within last 6 months    Recent Outpatient Visits          3 months ago Routine general medical examination at a health care facility   [facility], Megan P, DO   9 months ago Primary hypertension   [facility] [place], Megan P, DO   1 year ago Routine general medical examination at a health care facility   [facility] [place], [place], DO   1 year ago Essential hypertension   [facility], [place], DO   2 years ago Cellulitis of left lower extremity   [facility] [place], NP      Future Appointments            In 3 months Johnson, Marjie Skiff, DO [facility]   In 9 months  [facility], [facility]

## 2021-01-19 ENCOUNTER — Other Ambulatory Visit: Payer: Self-pay

## 2021-01-19 MED ORDER — MELOXICAM 15 MG PO TABS: ORAL_TABLET | ORAL | 1 refills | Status: DC

## 2021-01-19 MED ORDER — OMEPRAZOLE 40 MG PO CPDR: 40.0000 mg | DELAYED_RELEASE_CAPSULE | Freq: Every day | ORAL | 1 refills | Status: DC

## 2021-01-19 MED ORDER — LISINOPRIL 10 MG PO TABS: 10.0000 mg | ORAL_TABLET | Freq: Every day | ORAL | 1 refills | Status: DC

## 2021-01-19 MED ORDER — LEVOTHYROXINE SODIUM 75 MCG PO TABS: ORAL_TABLET | ORAL | 3 refills | Status: DC

## 2021-01-19 MED ORDER — METFORMIN HCL 500 MG PO TABS: 500.0000 mg | ORAL_TABLET | Freq: Every day | ORAL | 1 refills | Status: DC

## 2021-01-19 MED ORDER — ROSUVASTATIN CALCIUM 5 MG PO TABS: 5.0000 mg | ORAL_TABLET | Freq: Every day | ORAL | 1 refills | Status: DC

## 2021-01-19 NOTE — Telephone Encounter (Signed)
Patient now using [facility] for medications.

## 2021-02-08 ENCOUNTER — Other Ambulatory Visit: Payer: Self-pay

## 2021-02-08 MED ORDER — CYCLOBENZAPRINE HCL 10 MG PO TABS: 10.0000 mg | ORAL_TABLET | Freq: Every day | ORAL | 0 refills | Status: DC

## 2021-03-16 ENCOUNTER — Other Ambulatory Visit: Payer: Self-pay | Admitting: Family Medicine

## 2021-03-17 NOTE — Telephone Encounter (Signed)
Sent via Interface, requested too early. ?Requested Prescriptions  ?Pending Prescriptions Disp Refills  ?? meloxicam (MOBIC) 15 MG tablet [Pharmacy Med Name: Meloxicam 15 MG Oral Tablet] 100 tablet 2  ?  Sig: TAKE 1 TABLET BY MOUTH DAILY  ?  ? Analgesics:  COX2 Inhibitors Failed - 03/16/2021 10:34 PM  ?  ?  Failed - Manual Review: Labs are only required if the patient has taken medication for more than 8 weeks.  ?  ?  Failed - Cr in normal range and within 360 days  ?  Creatinine, Ser  ?Date Value Ref Range Status  ?10/11/2020 1.48 (H) 0.76 - 1.27 mg/dL Final  ?   ?  ?  Passed - HGB in normal range and within 360 days  ?  Hemoglobin  ?Date Value Ref Range Status  ?10/11/2020 16.0 13.0 - 17.7 g/dL Final  ?   ?  ?  Passed - HCT in normal range and within 360 days  ?  Hematocrit  ?Date Value Ref Range Status  ?10/11/2020 48.0 37.5 - 51.0 % Final  ?   ?  ?  Passed - AST in normal range and within 360 days  ?  AST  ?Date Value Ref Range Status  ?10/11/2020 18 0 - 40 IU/L Final  ?   ?  ?  Passed - ALT in normal range and within 360 days  ?  ALT  ?Date Value Ref Range Status  ?10/11/2020 19 0 - 44 IU/L Final  ?   ?  ?  Passed - eGFR is 30 or above and within 360 days  ?  GFR calc Af Amer  ?Date Value Ref Range Status  ?10/09/2019 64 >59 mL/min/1.73 Final  ?  Comment:  ?  **[facility] currently reports eGFR in compliance with the current** ?  recommendations of the [employer]. [facility] will ?  update reporting as new guidelines are published from the NKF-ASN ?  Task force. ?  ? ?GFR calc non Af Amer  ?Date Value Ref Range Status  ?10/09/2019 55 (L) >59 mL/min/1.73 Final  ? ?eGFR  ?Date Value Ref Range Status  ?10/11/2020 50 (L) >59 mL/min/1.73 Final  ?   ?  ?  Passed - Patient is not pregnant  ?  ?  Passed - Valid encounter within last 12 months  ?  Recent Outpatient Visits   ?      ? 5 months ago Routine general medical examination at a health care facility  ? [place], DO  ? 11  months ago Primary hypertension  ? [place], DO  ? 1 year ago Routine general medical examination at a health care facility  ? [place], Megan P, DO  ? 1 year ago Essential hypertension  ? [place] P, DO  ? 2 years ago Cellulitis of left lower extremity  ? [facility] [place], Henrine Screws T, NP  ?  ?  ?Future Appointments   ?        ? In 3 weeks Wynetta Emery, Barb Merino, DO [place], [facility]  ? In 7 months  [place], [facility]  ?  ? ?  ?  ?  ?? rosuvastatin (CRESTOR) 5 MG tablet [Pharmacy Med Name: Rosuvastatin Calcium 5 MG Oral Tablet] 100 tablet 2  ?  Sig: TAKE 1 TABLET BY MOUTH DAILY AT  6 PM.  ?  ? Cardiovascular:  Antilipid -  Statins 2 Failed - 03/16/2021 10:34 PM  ?  ?  Failed - Cr in normal range and within 360 days  ?  Creatinine, Ser  ?Date Value Ref Range Status  ?10/11/2020 1.48 (H) 0.76 - 1.27 mg/dL Final  ?   ?  ?  Failed - Lipid Panel in normal range within the last 12 months  ?  Cholesterol, Total  ?Date Value Ref Range Status  ?10/11/2020 133 100 - 199 mg/dL Final  ? ?LDL Chol Calc (NIH)  ?Date Value Ref Range Status  ?10/11/2020 75 0 - 99 mg/dL Final  ? ?HDL  ?Date Value Ref Range Status  ?10/11/2020 33 (L) >39 mg/dL Final  ? ?Triglycerides  ?Date Value Ref Range Status  ?10/11/2020 139 0 - 149 mg/dL Final  ? ?  ?  ?  Passed - Patient is not pregnant  ?  ?  Passed - Valid encounter within last 12 months  ?  Recent Outpatient Visits   ?      ? 5 months ago Routine general medical examination at a health care facility  ? [place], DO  ? 11 months ago Primary hypertension  ? [place], DO  ? 1 year ago Routine general medical examination at a health care facility  ? [place], Megan P, DO  ? 1 year ago Essential hypertension  ? [place] P, DO  ? 2 years ago Cellulitis of left  lower extremity  ? [facility], Henrine Screws T, NP  ?  ?  ?Future Appointments   ?        ? In 3 weeks Wynetta Emery, Barb Merino, DO [place], [facility]  ? In 7 months  [place], [facility]  ?  ? ?  ?  ?  ? ? ?

## 2021-03-18 ENCOUNTER — Encounter: Payer: Self-pay | Admitting: Family Medicine

## 2021-03-30 DIAGNOSIS — Z1211 Encounter for screening for malignant neoplasm of colon: Secondary | ICD-10-CM | POA: Diagnosis not present

## 2021-04-02 ENCOUNTER — Other Ambulatory Visit: Payer: Self-pay | Admitting: Family Medicine

## 2021-04-04 ENCOUNTER — Telehealth: Payer: Self-pay | Admitting: *Deleted

## 2021-04-04 NOTE — Telephone Encounter (Signed)
Pt states he is receiving via mail order and does not need this rx filled. ?Requested Prescriptions  ?Pending Prescriptions Disp Refills  ?? metFORMIN (GLUCOPHAGE) 500 MG tablet [Pharmacy Med Name: METFORMIN 500MG TABLETS] 90 tablet 1  ?  Sig: TAKE 1 TABLET(500 MG) BY MOUTH DAILY  ?  ? Endocrinology:  Diabetes - Biguanides Failed - 04/02/2021  5:23 PM  ?  ?  Failed - Cr in normal range and within 360 days  ?  Creatinine, Ser  ?Date Value Ref Range Status  ?10/11/2020 1.48 (H) 0.76 - 1.27 mg/dL Final  ?   ?  ?  Failed - eGFR in normal range and within 360 days  ?  GFR calc Af Amer  ?Date Value Ref Range Status  ?10/09/2019 64 >59 mL/min/1.73 Final  ?  Comment:  ?  **[facility] currently reports eGFR in compliance with the current** ?  recommendations of the [employer]. [facility] will ?  update reporting as new guidelines are published from the NKF-ASN ?  Task force. ?  ? ?GFR calc non Af Amer  ?Date Value Ref Range Status  ?10/09/2019 55 (L) >59 mL/min/1.73 Final  ? ?eGFR  ?Date Value Ref Range Status  ?10/11/2020 50 (L) >59 mL/min/1.73 Final  ?   ?  ?  Failed - B12 Level in normal range and within 720 days  ?  No results found for: VITAMINB12   ?  ?  Passed - HBA1C is between 0 and 7.9 and within 180 days  ?  HB A1C (BAYER DCA - WAIVED)  ?Date Value Ref Range Status  ?10/11/2020 5.7 (H) 4.8 - 5.6 % Final  ?  Comment:  ?           Prediabetes: 5.7 - 6.4 ?         Diabetes: >6.4 ?         Glycemic control for adults with diabetes: <7.0 ?              **Please note reference interval change** ?  ?   ?  ?  Passed - Valid encounter within last 6 months  ?  Recent Outpatient Visits   ?      ? 5 months ago Routine general medical examination at a health care facility  ? [place], DO  ? 12 months ago Primary hypertension  ? [place], DO  ? 1 year ago Routine general medical examination at a health care facility  ? [place], DO  ? 2 years ago Essential hypertension  ? [place] P, DO  ? 2 years ago Cellulitis of left lower extremity  ? [facility] [place], Henrine Screws T, NP  ?  ?  ?Future Appointments   ?        ? In 1 week Wynetta Emery, Barb Merino, DO [place], [facility]  ? In 6 months  [place], [facility]  ?  ? ?  ?  ?  Passed - CBC within normal limits and completed in the last 12 months  ?  WBC  ?Date Value Ref Range Status  ?10/11/2020 7.0 3.4 - 10.8 x10E3/uL Final  ? ?RBC  ?Date Value Ref Range Status  ?10/11/2020 5.53 4.14 - 5.80 x10E6/uL Final  ? ?Hemoglobin  ?Date Value Ref Range Status  ?10/11/2020 16.0 13.0 - 17.7 g/dL Final  ? ?Hematocrit  ?Date Value Ref Range  Status  ?10/11/2020 48.0 37.5 - 51.0 % Final  ? ?MCHC  ?Date Value Ref Range Status  ?10/11/2020 33.3 31.5 - 35.7 g/dL Final  ? ?MCH  ?Date Value Ref Range Status  ?10/11/2020 28.9 26.6 - 33.0 pg Final  ? ?MCV  ?Date Value Ref Range Status  ?10/11/2020 87 79 - 97 fL Final  ? ?No results found for: PLTCOUNTKUC, LABPLAT, [place] ?RDW  ?Date Value Ref Range Status  ?10/11/2020 13.2 11.6 - 15.4 % Final  ? ?  ?  ?  ? ? ?

## 2021-04-04 NOTE — Telephone Encounter (Signed)
Spoke with pt re Glucophage request from [facility], pt states he gets it through mail order and does not need it now, will continue to receive it via mail order. ?

## 2021-04-07 ENCOUNTER — Other Ambulatory Visit: Payer: Self-pay | Admitting: Family Medicine

## 2021-04-07 DIAGNOSIS — R195 Other fecal abnormalities: Secondary | ICD-10-CM

## 2021-04-07 LAB — COLOGUARD: COLOGUARD: POSITIVE — AB

## 2021-04-08 ENCOUNTER — Telehealth: Payer: Self-pay

## 2021-04-08 NOTE — Telephone Encounter (Signed)
CALLED PATIENT NO ANSWER LEFT VOICEMAIL FOR A CALL BACK ? ?

## 2021-04-08 NOTE — Telephone Encounter (Signed)
Pt returned call

## 2021-04-11 ENCOUNTER — Encounter: Payer: Self-pay | Admitting: Family Medicine

## 2021-04-11 ENCOUNTER — Other Ambulatory Visit: Payer: Self-pay

## 2021-04-11 ENCOUNTER — Ambulatory Visit (INDEPENDENT_AMBULATORY_CARE_PROVIDER_SITE_OTHER): Payer: Medicare Other | Admitting: Family Medicine

## 2021-04-11 ENCOUNTER — Telehealth: Payer: Self-pay

## 2021-04-11 VITALS — BP 137/72 | HR 62 | Temp 98.0°F | Wt 236.0 lb

## 2021-04-11 DIAGNOSIS — I10 Essential (primary) hypertension: Secondary | ICD-10-CM

## 2021-04-11 DIAGNOSIS — E039 Hypothyroidism, unspecified: Secondary | ICD-10-CM | POA: Diagnosis not present

## 2021-04-11 DIAGNOSIS — E790 Hyperuricemia without signs of inflammatory arthritis and tophaceous disease: Secondary | ICD-10-CM | POA: Diagnosis not present

## 2021-04-11 DIAGNOSIS — E782 Mixed hyperlipidemia: Secondary | ICD-10-CM | POA: Diagnosis not present

## 2021-04-11 DIAGNOSIS — E118 Type 2 diabetes mellitus with unspecified complications: Secondary | ICD-10-CM

## 2021-04-11 LAB — BAYER DCA HB A1C WAIVED: HB A1C (BAYER DCA - WAIVED): 5.5 % (ref 4.8–5.6)

## 2021-04-11 MED ORDER — ASPIRIN EC 81 MG PO TBEC: 81.0000 mg | DELAYED_RELEASE_TABLET | Freq: Every day | ORAL | 3 refills | Status: DC

## 2021-04-11 NOTE — Assessment & Plan Note (Signed)
Doing great with A1c 5.5. Continue current regimen. Continue to monitor. Call with any concerns.  ?

## 2021-04-11 NOTE — Assessment & Plan Note (Signed)
Under good control on current regimen. Continue current regimen. Continue to monitor. Call with any concerns. Refills given. Labs drawn today.   

## 2021-04-11 NOTE — Progress Notes (Signed)
? ?BP 137/72   Pulse 62   Temp 98 ?F (36.7 ?C)   Wt 236 lb (107 kg)   SpO2 96%   BMI 30.30 kg/m?   ? ?Subjective:  ? ? Patient ID: Kenneth Johnston, male    DOB: 06-12-1950, 71 y.o.   MRN: [id number] ? ?HPI: ?Kenneth Johnston is a 71 y.o. male ? ?Chief Complaint  ?Patient presents with  ? Diabetes  ?  Has eye exam coming up in May   ? Hypothyroidism  ? Hypertension  ? Hyperlipidemia  ? ?DIABETES ?Hypoglycemic episodes:no ?Polydipsia/polyuria: no ?Visual disturbance: no ?Chest pain: no ?Paresthesias: no ?Glucose Monitoring: yes ? Accucheck frequency: Daily ? Fasting glucose: 120s-130s, 149 ?Taking Insulin?: no ?Blood Pressure Monitoring: not checking ?Retinal Examination: Up to Date ?Foot Exam: Up to Date ?Diabetic Education: Completed ?Pneumovax:  Declined ?Influenza:  Declined ?Aspirin: yes ? ?HYPERTENSION / HYPERLIPIDEMIA ?Satisfied with current treatment? yes ?Duration of hypertension: chronic ?BP monitoring frequency: not checking ?BP medication side effects: no ?Past BP meds: lisinopril ?Duration of hyperlipidemia: chronic ?Cholesterol medication side effects: no ?Cholesterol supplements: none ?Past cholesterol medications: crestor ?Medication compliance: excellent compliance ?Aspirin: yes ?Recent stressors: no ?Recurrent headaches: no ?Visual changes: no ?Palpitations: no ?Dyspnea: no ?Chest pain: no ?Lower extremity edema: no ?Dizzy/lightheaded: no ? ?HYPOTHYROIDISM ?Thyroid control status:controlled ?Satisfied with current treatment? yes ?Medication side effects: no ?Medication compliance: excellent compliance ?Etiology of hypothyroidism:  ?Recent dose adjustment:no ?Fatigue: no ?Cold intolerance: no ?Heat intolerance: no ?Weight gain: no ?Weight loss: no ?Constipation: no ?Diarrhea/loose stools: no ?Palpitations: no ?Lower extremity edema: no ?Anxiety/depressed mood: no ? ? ?Relevant past medical, surgical, family and social history reviewed and updated as indicated. Interim medical history since our  last visit reviewed. ?Allergies and medications reviewed and updated. ? ?Review of Systems  ?Constitutional: Negative.   ?Respiratory: Negative.    ?Cardiovascular: Negative.   ?Gastrointestinal: Negative.   ?Musculoskeletal: Negative.   ?Psychiatric/Behavioral: Negative.    ? ?Per HPI unless specifically indicated above ? ?   ?Objective:  ?  ?BP 137/72   Pulse 62   Temp 98 ?F (36.7 ?C)   Wt 236 lb (107 kg)   SpO2 96%   BMI 30.30 kg/m?   ?Wt Readings from Last 3 Encounters:  ?04/11/21 236 lb (107 kg)  ?10/15/20 232 lb (105.2 kg)  ?10/11/20 233 lb (105.7 kg)  ?  ?Physical Exam ?Vitals and nursing note reviewed.  ?Constitutional:   ?   General: He is not in acute distress. ?   Appearance: Normal appearance. He is not ill-appearing, toxic-appearing or diaphoretic.  ?HENT:  ?   Head: Normocephalic and atraumatic.  ?   Right Ear: External ear normal.  ?   Left Ear: External ear normal.  ?   Nose: Nose normal.  ?   Mouth/Throat:  ?   Mouth: Mucous membranes are moist.  ?   Pharynx: Oropharynx is clear.  ?Eyes:  ?   General: No scleral icterus.    ?   Right eye: No discharge.     ?   Left eye: No discharge.  ?   Extraocular Movements: Extraocular movements intact.  ?   Conjunctiva/sclera: Conjunctivae normal.  ?   Pupils: Pupils are equal, round, and reactive to light.  ?Cardiovascular:  ?   Rate and Rhythm: Normal rate and regular rhythm.  ?   Pulses: Normal pulses.  ?   Heart sounds: Normal heart sounds. No murmur heard. ?  No friction rub. No gallop.  ?  Pulmonary:  ?   Effort: Pulmonary effort is normal. No respiratory distress.  ?   Breath sounds: Normal breath sounds. No stridor. No wheezing, rhonchi or rales.  ?Chest:  ?   Chest wall: No tenderness.  ?Musculoskeletal:     ?   General: Normal range of motion.  ?   Cervical back: Normal range of motion and neck supple.  ?Skin: ?   General: Skin is warm and dry.  ?   Capillary Refill: Capillary refill takes less than 2 seconds.  ?   Coloration: Skin is not jaundiced  or pale.  ?   Findings: No bruising, erythema, lesion or rash.  ?Neurological:  ?   General: No focal deficit present.  ?   Mental Status: He is alert and oriented to person, place, and time. Mental status is at baseline.  ?Psychiatric:     ?   Mood and Affect: Mood normal.     ?   Behavior: Behavior normal.     ?   Thought Content: Thought content normal.     ?   Judgment: Judgment normal.  ? ? ?Results for orders placed or performed in visit on 03/18/21  ?Cologuard  ?Result Value Ref Range  ? COLOGUARD Positive (A) Negative  ? ?   ?Assessment & Plan:  ? ?Problem List Items Addressed This Visit   ? ?  ? Cardiovascular and Mediastinum  ? HTN (hypertension)  ?  Under good control on current regimen. Continue current regimen. Continue to monitor. Call with any concerns. Refills given. Labs drawn today. ? ?  ?  ? Relevant Medications  ? aspirin EC 81 MG tablet  ? Other Relevant Orders  ? CBC with Differential/Platelet  ? Comprehensive metabolic panel  ?  ? Endocrine  ? Hypothyroidism  ?  Rechecking labs today. Await results. Treat as needed. Call with any concerns. Continue to monitor. ?  ?  ? Relevant Orders  ? CBC with Differential/Platelet  ? Comprehensive metabolic panel  ? TSH  ? Controlled type 2 diabetes mellitus with complication, without long-term current use of insulin ([place]) - Primary  ?  Doing great with A1c 5.5. Continue current regimen. Continue to monitor. Call with any concerns.  ?  ?  ? Relevant Medications  ? aspirin EC 81 MG tablet  ? Other Relevant Orders  ? CBC with Differential/Platelet  ? Bayer DCA Hb A1c Waived  ? Comprehensive metabolic panel  ?  ? Other  ? Hyperlipidemia  ?  Under good control on current regimen. Continue current regimen. Continue to monitor. Call with any concerns. Refills given. Labs drawn today. ? ?  ?  ? Relevant Medications  ? aspirin EC 81 MG tablet  ? Other Relevant Orders  ? CBC with Differential/Platelet  ? Comprehensive metabolic panel  ? Lipid Panel w/o Chol/HDL  Ratio  ? Elevated uric acid in blood  ?  Rechecking labs today. Await results. Treat as needed. Call with any concerns. Continue to monitor. ?  ?  ? Relevant Orders  ? CBC with Differential/Platelet  ? Comprehensive metabolic panel  ? Uric acid  ?  ? ?Follow up plan: ?Return in about 6 months (around 10/12/2021) for physical. ? ? ? ? ? ?

## 2021-04-11 NOTE — Assessment & Plan Note (Signed)
Rechecking labs today. Await results. Treat as needed. Call with any concerns. Continue to monitor. ?

## 2021-04-11 NOTE — Telephone Encounter (Signed)
CALLED PATIENT NO ANSWER LEFT VOICEMAIL FOR A CALL BACK LETTER SENT 

## 2021-04-11 NOTE — Assessment & Plan Note (Signed)
Rechecking labs today. Await results. Treat as needed. Call with any concerns. Continue to monitor. ?

## 2021-04-12 ENCOUNTER — Encounter: Payer: Self-pay | Admitting: Family Medicine

## 2021-04-12 LAB — CBC WITH DIFFERENTIAL/PLATELET
Basophils Absolute: 0.1 10*3/uL (ref 0.0–0.2)
Basos: 1 %
EOS (ABSOLUTE): 0.1 10*3/uL (ref 0.0–0.4)
Eos: 2 %
Hematocrit: 46.5 % (ref 37.5–51.0)
Hemoglobin: 15.9 g/dL (ref 13.0–17.7)
Immature Grans (Abs): 0 10*3/uL (ref 0.0–0.1)
Immature Granulocytes: 0 %
Lymphocytes Absolute: 1.3 10*3/uL (ref 0.7–3.1)
Lymphs: 17 %
MCH: 28.9 pg (ref 26.6–33.0)
MCHC: 34.2 g/dL (ref 31.5–35.7)
MCV: 84 fL (ref 79–97)
Monocytes Absolute: 0.6 10*3/uL (ref 0.1–0.9)
Monocytes: 8 %
Neutrophils Absolute: 5.4 10*3/uL (ref 1.4–7.0)
Neutrophils: 72 %
Platelets: 213 10*3/uL (ref 150–450)
RBC: 5.51 x10E6/uL (ref 4.14–5.80)
RDW: 13.5 % (ref 11.6–15.4)
WBC: 7.4 10*3/uL (ref 3.4–10.8)

## 2021-04-12 LAB — COMPREHENSIVE METABOLIC PANEL
ALT: 17 IU/L (ref 0–44)
AST: 16 IU/L (ref 0–40)
Albumin/Globulin Ratio: 1.6 (ref 1.2–2.2)
Albumin: 4.2 g/dL (ref 3.7–4.7)
Alkaline Phosphatase: 59 IU/L (ref 44–121)
BUN/Creatinine Ratio: 8 — ABNORMAL LOW (ref 10–24)
BUN: 10 mg/dL (ref 8–27)
Bilirubin Total: 0.3 mg/dL (ref 0.0–1.2)
CO2: 23 mmol/L (ref 20–29)
Calcium: 9.5 mg/dL (ref 8.6–10.2)
Chloride: 104 mmol/L (ref 96–106)
Creatinine, Ser: 1.22 mg/dL (ref 0.76–1.27)
Globulin, Total: 2.7 g/dL (ref 1.5–4.5)
Glucose: 103 mg/dL — ABNORMAL HIGH (ref 70–99)
Potassium: 4.2 mmol/L (ref 3.5–5.2)
Sodium: 142 mmol/L (ref 134–144)
Total Protein: 6.9 g/dL (ref 6.0–8.5)
eGFR: 63 mL/min/{1.73_m2} (ref >59)

## 2021-04-12 LAB — TSH: TSH: 3.1 u[IU]/mL (ref 0.450–4.500)

## 2021-04-12 LAB — LIPID PANEL W/O CHOL/HDL RATIO
Cholesterol, Total: 141 mg/dL (ref 100–199)
HDL: 34 mg/dL — ABNORMAL LOW (ref >39)
LDL Chol Calc (NIH): 84 mg/dL (ref 0–99)
Triglycerides: 130 mg/dL (ref 0–149)
VLDL Cholesterol Cal: 23 mg/dL (ref 5–40)

## 2021-04-12 LAB — URIC ACID: Uric Acid: 8.7 mg/dL — ABNORMAL HIGH (ref 3.8–8.4)

## 2021-04-17 ENCOUNTER — Other Ambulatory Visit: Payer: Self-pay | Admitting: Family Medicine

## 2021-04-19 NOTE — Telephone Encounter (Signed)
Requested Prescriptions  ?Pending Prescriptions Disp Refills  ?? omeprazole (PRILOSEC) 40 MG capsule [Pharmacy Med Name: OMEPRAZOLE 40MG  CAPSULES] 90 capsule 1  ?  Sig: TAKE 1 CAPSULE(40 MG) BY MOUTH DAILY  ?  ? Gastroenterology: Proton Pump Inhibitors Passed - 04/17/2021  4:18 PM  ?  ?  Passed - Valid encounter within last 12 months  ?  Recent Outpatient Visits   ?      ? 1 week ago Controlled type 2 diabetes mellitus with complication, without long-term current use of insulin ([place])  ? [place], DO  ? 6 months ago Routine general medical examination at a health care facility  ? [place], Megan P, DO  ? 1 year ago Primary hypertension  ? [place], DO  ? 1 year ago Routine general medical examination at a health care facility  ? [place], DO  ? 2 years ago Essential hypertension  ? [place], [place] P, DO  ?  ?  ?Future Appointments   ?        ? In 5 months Wynetta Emery, Barb Merino, DO [place], [facility]  ? In 6 months  [place], [facility]  ?  ? ?  ?  ?  ? ? ?

## 2021-04-20 ENCOUNTER — Other Ambulatory Visit: Payer: Self-pay | Admitting: Family Medicine

## 2021-04-21 NOTE — Telephone Encounter (Signed)
Requested Prescriptions  ?Pending Prescriptions Disp Refills  ?? metFORMIN (GLUCOPHAGE) 500 MG tablet [Pharmacy Med Name: metFORMIN HCl 500 MG Oral Tablet] 100 tablet 2  ?  Sig: TAKE 1 TABLET BY MOUTH DAILY  ?  ? Endocrinology:  Diabetes - Biguanides Failed - 04/20/2021 11:28 PM  ?  ?  Failed - B12 Level in normal range and within 720 days  ?  No results found for: VITAMINB12   ?  ?  Passed - Cr in normal range and within 360 days  ?  Creatinine, Ser  ?Date Value Ref Range Status  ?04/11/2021 1.22 0.76 - 1.27 mg/dL Final  ?   ?  ?  Passed - HBA1C is between 0 and 7.9 and within 180 days  ?  HB A1C (BAYER DCA - WAIVED)  ?Date Value Ref Range Status  ?04/11/2021 5.5 4.8 - 5.6 % Final  ?  Comment:  ?           Prediabetes: 5.7 - 6.4 ?         Diabetes: >6.4 ?         Glycemic control for adults with diabetes: <7.0 ?  ?   ?  ?  Passed - eGFR in normal range and within 360 days  ?  GFR calc Af Amer  ?Date Value Ref Range Status  ?10/09/2019 64 >59 mL/min/1.73 Final  ?  Comment:  ?  **[facility] currently reports eGFR in compliance with the current** ?  recommendations of the [employer]. [facility] will ?  update reporting as new guidelines are published from the NKF-ASN ?  Task force. ?  ? ?GFR calc non Af Amer  ?Date Value Ref Range Status  ?10/09/2019 55 (L) >59 mL/min/1.73 Final  ? ?eGFR  ?Date Value Ref Range Status  ?04/11/2021 63 >59 mL/min/1.73 Final  ?   ?  ?  Passed - Valid encounter within last 6 months  ?  Recent Outpatient Visits   ?      ? 1 week ago Controlled type 2 diabetes mellitus with complication, without long-term current use of insulin ([place])  ? [place], DO  ? 6 months ago Routine general medical examination at a health care facility  ? [place], Megan P, DO  ? 1 year ago Primary hypertension  ? [place], DO  ? 1 year ago Routine general medical examination at a health care facility  ? [place], DO  ? 2 years ago Essential hypertension  ? [place], [place] P, DO  ?  ?  ?Future Appointments   ?        ? In 5 months Wynetta Emery, Barb Merino, DO [place], [facility]  ? In 5 months  [place], [facility]  ?  ? ?  ?  ?  Passed - CBC within normal limits and completed in the last 12 months  ?  WBC  ?Date Value Ref Range Status  ?04/11/2021 7.4 3.4 - 10.8 x10E3/uL Final  ? ?RBC  ?Date Value Ref Range Status  ?04/11/2021 5.51 4.14 - 5.80 x10E6/uL Final  ? ?Hemoglobin  ?Date Value Ref Range Status  ?04/11/2021 15.9 13.0 - 17.7 g/dL Final  ? ?Hematocrit  ?Date Value Ref Range Status  ?04/11/2021 46.5 37.5 - 51.0 % Final  ? ?MCHC  ?Date Value Ref Range Status  ?04/11/2021 34.2 31.5 - 35.7 g/dL Final  ? ?  MCH  ?Date Value Ref Range Status  ?04/11/2021 28.9 26.6 - 33.0 pg Final  ? ?MCV  ?Date Value Ref Range Status  ?04/11/2021 84 79 - 97 fL Final  ? ?No results found for: PLTCOUNTKUC, LABPLAT, [place] ?RDW  ?Date Value Ref Range Status  ?04/11/2021 13.5 11.6 - 15.4 % Final  ? ?  ?  ?  ? ? ?

## 2021-05-04 ENCOUNTER — Other Ambulatory Visit: Payer: Self-pay | Admitting: Family Medicine

## 2021-05-05 NOTE — Telephone Encounter (Signed)
Requested medication (s) are due for refill today:    ? ?Requested medication (s) are on the active medication list:   Yes ? ?Future visit scheduled:   Yes ? ? ?Last ordered: 04/19/2021 #90, 1 refill ? ?Returned because mail order phar. Requesting a 1 yr. Supply.  ? ?Requested Prescriptions  ?Pending Prescriptions Disp Refills  ? omeprazole (PRILOSEC) 40 MG capsule [Pharmacy Med Name: Omeprazole 40 MG Oral Capsule Delayed Release] 100 capsule 2  ?  Sig: TAKE 1 CAPSULE BY MOUTH DAILY  ?  ? Gastroenterology: Proton Pump Inhibitors Passed - 05/04/2021 11:12 PM  ?  ?  Passed - Valid encounter within last 12 months  ?  Recent Outpatient Visits   ? ?      ? 3 weeks ago Controlled type 2 diabetes mellitus with complication, without long-term current use of insulin (HCC)  ? [facility] [place], Megan P, DO  ? 6 months ago Routine general medical examination at a health care facility  ? [facility] [place], Megan P, DO  ? 1 year ago Primary hypertension  ? [facility] [place], [place] P, DO  ? 1 year ago Routine general medical examination at a health care facility  ? [facility] [place], Megan P, DO  ? 2 years ago Essential hypertension  ? [facility] [place], [place] P, DO  ? ?  ?  ?Future Appointments   ? ?        ? In 5 months Laural Benes, Oralia Rud, DO [facility], [facility]  ? In 5 months  [facility], [facility]  ? ?  ? ? ?  ?  ?  ? ?

## 2021-05-10 ENCOUNTER — Other Ambulatory Visit: Payer: Self-pay | Admitting: Family Medicine

## 2021-05-11 ENCOUNTER — Other Ambulatory Visit: Payer: Self-pay | Admitting: Family Medicine

## 2021-05-11 NOTE — Telephone Encounter (Signed)
Rx 01/19/21 #90 1RF- 6 month supply ?Too soon ?Requested Prescriptions  ?Pending Prescriptions Disp Refills  ?? rosuvastatin (CRESTOR) 5 MG tablet [Pharmacy Med Name: Rosuvastatin Calcium 5 MG Oral Tablet] 100 tablet 2  ?  Sig: TAKE 1 TABLET BY MOUTH DAILY AT  6 PM.  ?  ? Cardiovascular:  Antilipid - Statins 2 Failed - 05/10/2021  6:37 AM  ?  ?  Failed - Lipid Panel in normal range within the last 12 months  ?  Cholesterol, Total  ?Date Value Ref Range Status  ?04/11/2021 141 100 - 199 mg/dL Final  ? ?LDL Chol Calc (NIH)  ?Date Value Ref Range Status  ?04/11/2021 84 0 - 99 mg/dL Final  ? ?HDL  ?Date Value Ref Range Status  ?04/11/2021 34 (L) >39 mg/dL Final  ? ?Triglycerides  ?Date Value Ref Range Status  ?04/11/2021 130 0 - 149 mg/dL Final  ? ?  ?  ?  Passed - Cr in normal range and within 360 days  ?  Creatinine, Ser  ?Date Value Ref Range Status  ?04/11/2021 1.22 0.76 - 1.27 mg/dL Final  ?   ?  ?  Passed - Patient is not pregnant  ?  ?  Passed - Valid encounter within last 12 months  ?  Recent Outpatient Visits   ?      ? 1 month ago Controlled type 2 diabetes mellitus with complication, without long-term current use of insulin (HCC)  ? [facility] [place], Megan P, DO  ? 7 months ago Routine general medical examination at a health care facility  ? [facility] [place], Megan P, DO  ? 1 year ago Primary hypertension  ? [facility] [place], [place] P, DO  ? 1 year ago Routine general medical examination at a health care facility  ? [facility] [place], Megan P, DO  ? 2 years ago Essential hypertension  ? [facility] [place], [place] P, DO  ?  ?  ?Future Appointments   ?        ? In 5 months Laural Benes, Oralia Rud, DO [facility], [facility]  ? In 5 months  [facility], [facility]  ?  ? ?  ?  ?  ? ?

## 2021-05-12 NOTE — Telephone Encounter (Signed)
Refilled 01/19/2021 #90 1 refill. ?Requested Prescriptions  ?Pending Prescriptions Disp Refills  ?? lisinopril (ZESTRIL) 10 MG tablet [Pharmacy Med Name: Lisinopril 10 MG Oral Tablet] 100 tablet 2  ?  Sig: TAKE 1 TABLET BY MOUTH DAILY  ?  ? Cardiovascular:  ACE Inhibitors Passed - 05/11/2021 11:11 PM  ?  ?  Passed - Cr in normal range and within 180 days  ?  Creatinine, Ser  ?Date Value Ref Range Status  ?04/11/2021 1.22 0.76 - 1.27 mg/dL Final  ?   ?  ?  Passed - K in normal range and within 180 days  ?  Potassium  ?Date Value Ref Range Status  ?04/11/2021 4.2 3.5 - 5.2 mmol/L Final  ?   ?  ?  Passed - Patient is not pregnant  ?  ?  Passed - Last BP in normal range  ?  BP Readings from Last 1 Encounters:  ?04/11/21 137/72  ?   ?  ?  Passed - Valid encounter within last 6 months  ?  Recent Outpatient Visits   ?      ? 1 month ago Controlled type 2 diabetes mellitus with complication, without long-term current use of insulin (HCC)  ? [facility], Megan P, DO  ? 7 months ago Routine general medical examination at a health care facility  ? [facility] [place], Megan P, DO  ? 1 year ago Primary hypertension  ? [facility] [place], [place] P, DO  ? 1 year ago Routine general medical examination at a health care facility  ? [facility] [place], Megan P, DO  ? 2 years ago Essential hypertension  ? [facility] [place], [place] P, DO  ?  ?  ?Future Appointments   ?        ? In 5 months Laural Benes, Oralia Rud, DO [facility], [facility]  ? In 5 months  [facility], [facility]  ?  ? ?  ?  ?  ? ?

## 2021-05-31 ENCOUNTER — Other Ambulatory Visit: Payer: Self-pay | Admitting: Family Medicine

## 2021-06-01 NOTE — Telephone Encounter (Signed)
Requested Prescriptions  ?Pending Prescriptions Disp Refills  ?? meloxicam (MOBIC) 15 MG tablet [Pharmacy Med Name: Meloxicam 15 MG Oral Tablet] 80 tablet 1  ?  Sig: TAKE 1 TABLET BY MOUTH DAILY  ?  ? Analgesics:  COX2 Inhibitors Failed - 05/31/2021 11:07 PM  ?  ?  Failed - Manual Review: Labs are only required if the patient has taken medication for more than 8 weeks.  ?  ?  Passed - HGB in normal range and within 360 days  ?  Hemoglobin  ?Date Value Ref Range Status  ?04/11/2021 15.9 13.0 - 17.7 g/dL Final  ?   ?  ?  Passed - Cr in normal range and within 360 days  ?  Creatinine, Ser  ?Date Value Ref Range Status  ?04/11/2021 1.22 0.76 - 1.27 mg/dL Final  ?   ?  ?  Passed - HCT in normal range and within 360 days  ?  Hematocrit  ?Date Value Ref Range Status  ?04/11/2021 46.5 37.5 - 51.0 % Final  ?   ?  ?  Passed - AST in normal range and within 360 days  ?  AST  ?Date Value Ref Range Status  ?04/11/2021 16 0 - 40 IU/L Final  ?   ?  ?  Passed - ALT in normal range and within 360 days  ?  ALT  ?Date Value Ref Range Status  ?04/11/2021 17 0 - 44 IU/L Final  ?   ?  ?  Passed - eGFR is 30 or above and within 360 days  ?  GFR calc Af Amer  ?Date Value Ref Range Status  ?10/09/2019 64 >59 mL/min/1.73 Final  ?  Comment:  ?  **[facility] currently reports eGFR in compliance with the current** ?  recommendations of the [employer]. [facility] will ?  update reporting as new guidelines are published from the NKF-ASN ?  Task force. ?  ? ?GFR calc non Af Amer  ?Date Value Ref Range Status  ?10/09/2019 55 (L) >59 mL/min/1.73 Final  ? ?eGFR  ?Date Value Ref Range Status  ?04/11/2021 63 >59 mL/min/1.73 Final  ?   ?  ?  Passed - Patient is not pregnant  ?  ?  Passed - Valid encounter within last 12 months  ?  Recent Outpatient Visits   ?      ? 1 month ago Controlled type 2 diabetes mellitus with complication, without long-term current use of insulin ([place])  ? [place], DO  ? 7 months ago  Routine general medical examination at a health care facility  ? [place], Megan P, DO  ? 1 year ago Primary hypertension  ? [place], DO  ? 1 year ago Routine general medical examination at a health care facility  ? [place], DO  ? 2 years ago Essential hypertension  ? [place], [place] P, DO  ?  ?  ?Future Appointments   ?        ? In 4 months Wynetta Emery, Barb Merino, DO [place], [facility]  ? In 4 months  [place], [facility]  ?  ? ?  ?  ?  ? ?

## 2021-06-05 ENCOUNTER — Other Ambulatory Visit: Payer: Self-pay | Admitting: Family Medicine

## 2021-06-07 NOTE — Telephone Encounter (Signed)
Requested Prescriptions  Pending Prescriptions Disp Refills  . levothyroxine (SYNTHROID) 75 MCG tablet [Pharmacy Med Name: LEVOTHYROXINE 0.075MG  ( ) TABS] 90 tablet 3    Sig: TAKE 1 TABLET(75 MCG) BY MOUTH DAILY BEFORE BREAKFAST     Endocrinology:  Hypothyroid Agents Passed - 06/05/2021  7:08 AM      Passed - TSH in normal range and within 360 days    TSH  Date Value Ref Range Status  04/11/2021 3.100 0.450 - 4.500 uIU/mL Final         Passed - Valid encounter within last 12 months    Recent Outpatient Visits          1 month ago Controlled type 2 diabetes mellitus with complication, without long-term current use of insulin (HCC)   [facility] [place], Megan P, DO   7 months ago Routine general medical examination at a health care facility   [facility] [place], [place], DO   1 year ago Primary hypertension   [facility] [place], Megan P, DO   1 year ago Routine general medical examination at a health care facility   [facility] Tina, Joyce, DO   2 years ago Essential hypertension   [facility] [place], Oralia Rud, DO      Future Appointments            In 4 months Laural Benes, Oralia Rud, DO [facility], [facility]   In 4 months  [employer], [facility]

## 2021-06-11 DIAGNOSIS — S90935A Unspecified superficial injury of left lesser toe(s), initial encounter: Secondary | ICD-10-CM | POA: Diagnosis not present

## 2021-06-13 ENCOUNTER — Other Ambulatory Visit: Payer: Self-pay | Admitting: Family Medicine

## 2021-06-15 DIAGNOSIS — S90222A Contusion of left lesser toe(s) with damage to nail, initial encounter: Secondary | ICD-10-CM | POA: Diagnosis not present

## 2021-06-15 DIAGNOSIS — W228XXA Striking against or struck by other objects, initial encounter: Secondary | ICD-10-CM | POA: Diagnosis not present

## 2021-06-15 NOTE — Telephone Encounter (Signed)
Requested medication (s) are due for refill today:   Requested medication (s) are on the active medication list: Yes  Last refill:  Cyclobenzaprine - 02/05/21  Future visit scheduled: Yes  Notes to clinic:  [employer] asking for a year supply.    Requested Prescriptions  Pending Prescriptions Disp Refills   omeprazole (PRILOSEC) 40 MG capsule [Pharmacy Med Name: Omeprazole 40 MG Oral Capsule Delayed Release] 80 capsule 3    Sig: TAKE 1 CAPSULE BY MOUTH DAILY     Gastroenterology: Proton Pump Inhibitors Passed - 06/13/2021  1:46 PM      Passed - Valid encounter within last 12 months    Recent Outpatient Visits           2 months ago Controlled type 2 diabetes mellitus with complication, without long-term current use of insulin (HCC)   [facility] [place], Megan P, DO   8 months ago Routine general medical examination at a health care facility   [facility] [place], [place], DO   1 year ago Primary hypertension   [facility] [place], Megan P, DO   1 year ago Routine general medical examination at a health care facility   [facility], [place] P, DO   2 years ago Essential hypertension   [facility] [place], Megan P, DO       Future Appointments             In 3 months Johnson, Megan P, DO [facility], [facility]   In 4 months  [employer], [facility]              cyclobenzaprine (FLEXERIL) 10 MG tablet [employer] Med Name: Cyclobenzaprine HCl 10 MG Oral Tablet] 30 tablet 0    Sig: TAKE 1 TABLET BY MOUTH AT  BEDTIME     Not Delegated - Analgesics:  Muscle Relaxants Failed - 06/13/2021  1:46 PM      Failed - This refill cannot be delegated      Passed - Valid encounter within last 6 months    Recent Outpatient Visits           2 months ago Controlled type 2 diabetes mellitus with complication, without long-term current use of insulin (HCC)   [facility] [place], Megan P, DO    8 months ago Routine general medical examination at a health care facility   [facility] [place], [place], DO   1 year ago Primary hypertension   [facility] [place], Megan P, DO   1 year ago Routine general medical examination at a health care facility   [facility], [place], DO   2 years ago Essential hypertension   [facility] [place], [place], DO       Future Appointments             In 3 months Johnson, Megan P, DO [facility], [facility]   In 4 months  [employer], [facility]              lisinopril (ZESTRIL) 10 MG tablet [employer] Med Name: Lisinopril 10 MG Oral Tablet] 100 tablet 2    Sig: TAKE 1 TABLET BY MOUTH DAILY     Cardiovascular:  ACE Inhibitors Passed - 06/13/2021  1:46 PM      Passed - Cr in normal range and within 180 days    Creatinine, Ser  Date Value Ref Range Status  04/11/2021 1.22 0.76 -  1.27 mg/dL Final         Passed - K in normal range and within 180 days    Potassium  Date Value Ref Range Status  04/11/2021 4.2 3.5 - 5.2 mmol/L Final         Passed - Patient is not pregnant      Passed - Last BP in normal range    BP Readings from Last 1 Encounters:  04/11/21 137/72         Passed - Valid encounter within last 6 months    Recent Outpatient Visits           2 months ago Controlled type 2 diabetes mellitus with complication, without long-term current use of insulin (HCC)   [facility] [place], Megan P, DO   8 months ago Routine general medical examination at a health care facility   [facility] [place], [place], DO   1 year ago Primary hypertension   [facility] [place], Megan P, DO   1 year ago Routine general medical examination at a health care facility   [facility] [place], Timmothy Baranowski, DO   2 years ago Essential hypertension   [facility] [name], DO       Future Appointments              In 3 months Laural Benes, Oralia Rud, DO [facility], [facility]   In 4 months  [employer], [facility]

## 2021-06-16 ENCOUNTER — Other Ambulatory Visit: Payer: Self-pay | Admitting: Family Medicine

## 2021-06-17 NOTE — Telephone Encounter (Signed)
rxs was sent yesterday by provider to same pharmacy. E-Prescribing Status: Receipt confirmed by pharmacy (06/16/2021 2:02 PM EDT) Requested Prescriptions  Pending Prescriptions Disp Refills  . cyclobenzaprine (FLEXERIL) 10 MG tablet [Pharmacy Med Name: Cyclobenzaprine HCl 10 MG Oral Tablet] 30 tablet 0    Sig: TAKE 1 TABLET BY MOUTH AT  BEDTIME     Not Delegated - Analgesics:  Muscle Relaxants Failed - 06/16/2021  2:36 PM      Failed - This refill cannot be delegated      Passed - Valid encounter within last 6 months    Recent Outpatient Visits          2 months ago Controlled type 2 diabetes mellitus with complication, without long-term current use of insulin (HCC)   [facility] [place], Megan P, DO   8 months ago Routine general medical examination at a health care facility   [facility] [place], [place], DO   1 year ago Primary hypertension   [facility] [place], Megan P, DO   1 year ago Routine general medical examination at a health care facility   [facility] [place], [place], DO   2 years ago Essential hypertension   [facility] [place], Oralia Rud, DO      Future Appointments            In 3 months Laural Benes, Oralia Rud, DO [facility], [facility]   In 4 months  [employer], [facility]           . omeprazole (PRILOSEC) 40 MG capsule [Pharmacy Med Name: Omeprazole 40 MG Oral Capsule Delayed Release] 80 capsule 3    Sig: TAKE 1 CAPSULE BY MOUTH DAILY     Gastroenterology: Proton Pump Inhibitors Passed - 06/16/2021  2:36 PM      Passed - Valid encounter within last 12 months    Recent Outpatient Visits          2 months ago Controlled type 2 diabetes mellitus with complication, without long-term current use of insulin (HCC)   [facility] [place], Megan P, DO   8 months ago Routine general medical examination at a health care facility   [facility] [place], Circle, DO   1 year  ago Primary hypertension   [facility] [place], Megan P, DO   1 year ago Routine general medical examination at a health care facility   [facility] [place], [place], DO   2 years ago Essential hypertension   [facility] [place], Oralia Rud, DO      Future Appointments            In 3 months Laural Benes, Oralia Rud, DO [facility], [facility]   In 4 months  [employer], [facility]           . lisinopril (ZESTRIL) 10 MG tablet [Pharmacy Med Name: Lisinopril 10 MG Oral Tablet] 100 tablet 2    Sig: TAKE 1 TABLET BY MOUTH DAILY     Cardiovascular:  ACE Inhibitors Passed - 06/16/2021  2:36 PM      Passed - Cr in normal range and within 180 days    Creatinine, Ser  Date Value Ref Range Status  04/11/2021 1.22 0.76 - 1.27 mg/dL Final         Passed - K in normal range and within 180 days    Potassium  Date Value Ref Range Status  04/11/2021 4.2 3.5 -  5.2 mmol/L Final         Passed - Patient is not pregnant      Passed - Last BP in normal range    BP Readings from Last 1 Encounters:  04/11/21 137/72         Passed - Valid encounter within last 6 months    Recent Outpatient Visits          2 months ago Controlled type 2 diabetes mellitus with complication, without long-term current use of insulin (HCC)   [facility] [place], Megan P, DO   8 months ago Routine general medical examination at a health care facility   [facility] [place], [place], DO   1 year ago Primary hypertension   [facility] [place], Megan P, DO   1 year ago Routine general medical examination at a health care facility   [facility], [place], DO   2 years ago Essential hypertension   [facility] [name], DO      Future Appointments            In 3 months Laural Benes, Oralia Rud, DO [facility], [facility]   In 4 months  [employer], [facility]

## 2021-06-21 ENCOUNTER — Telehealth: Payer: Self-pay | Admitting: Cardiovascular Disease

## 2021-06-21 NOTE — Telephone Encounter (Signed)
3 attempts to schedule fu appt from recall list.   Deleting recall.   

## 2021-07-13 LAB — HM DIABETES EYE EXAM

## 2021-07-18 ENCOUNTER — Other Ambulatory Visit: Payer: Self-pay | Admitting: Family Medicine

## 2021-07-20 NOTE — Telephone Encounter (Signed)
Requested Prescriptions  Pending Prescriptions Disp Refills  . rosuvastatin (CRESTOR) 5 MG tablet [Pharmacy Med Name: ROSUVASTATIN 5MG  TABLETS] 90 tablet 0    Sig: TAKE 1 TABLET(5 MG) BY MOUTH DAILY AT 6 PM     Cardiovascular:  Antilipid - Statins 2 Failed - 07/18/2021  1:07 PM      Failed - Lipid Panel in normal range within the last 12 months    Cholesterol, Total  Date Value Ref Range Status  04/11/2021 141 100 - 199 mg/dL Final   LDL Chol Calc (NIH)  Date Value Ref Range Status  04/11/2021 84 0 - 99 mg/dL Final   HDL  Date Value Ref Range Status  04/11/2021 34 (L) >39 mg/dL Final   Triglycerides  Date Value Ref Range Status  04/11/2021 130 0 - 149 mg/dL Final         Passed - Cr in normal range and within 360 days    Creatinine, Ser  Date Value Ref Range Status  04/11/2021 1.22 0.76 - 1.27 mg/dL Final         Passed - Patient is not pregnant      Passed - Valid encounter within last 12 months    Recent Outpatient Visits          3 months ago Controlled type 2 diabetes mellitus with complication, without long-term current use of insulin (HCC)   [facility] [place], Megan P, DO   9 months ago Routine general medical examination at a health care facility   [facility] [place], [place], DO   1 year ago Primary hypertension   [facility] Kim, Megan P, DO   1 year ago Routine general medical examination at a health care facility   [facility] [place], [place], DO   2 years ago Essential hypertension   [facility] Bynum, [place], DO      Future Appointments            In 1 month Gollan, Penn yan, MD [facility], [facility]   In 2 months Mild[place], [place], DO [facility], [facility]   In 2 months  [employer], [facility]

## 2021-07-26 ENCOUNTER — Other Ambulatory Visit: Payer: Self-pay | Admitting: Family Medicine

## 2021-07-27 NOTE — Telephone Encounter (Signed)
Sent via interface, early request. Receipt confirmed by pharmacy 07/20/21 at 11:36 Requested Prescriptions  Pending Prescriptions Disp Refills  . rosuvastatin (CRESTOR) 5 MG tablet [Pharmacy Med Name: Rosuvastatin Calcium 5 MG Oral Tablet] 80 tablet 3    Sig: TAKE 1 TABLET BY MOUTH DAILY AT  6 PM.     Cardiovascular:  Antilipid - Statins 2 Failed - 07/26/2021 11:33 PM      Failed - Lipid Panel in normal range within the last 12 months    Cholesterol, Total  Date Value Ref Range Status  04/11/2021 141 100 - 199 mg/dL Final   LDL Chol Calc (NIH)  Date Value Ref Range Status  04/11/2021 84 0 - 99 mg/dL Final   HDL  Date Value Ref Range Status  04/11/2021 34 (L) >39 mg/dL Final   Triglycerides  Date Value Ref Range Status  04/11/2021 130 0 - 149 mg/dL Final         Passed - Cr in normal range and within 360 days    Creatinine, Ser  Date Value Ref Range Status  04/11/2021 1.22 0.76 - 1.27 mg/dL Final         Passed - Patient is not pregnant      Passed - Valid encounter within last 12 months    Recent Outpatient Visits          3 months ago Controlled type 2 diabetes mellitus with complication, without long-term current use of insulin (HCC)   [facility] [place], Megan P, DO   9 months ago Routine general medical examination at a health care facility   [facility] [place], [place], DO   1 year ago Primary hypertension   [facility] [place], Megan P, DO   1 year ago Routine general medical examination at a health care facility   [facility] [place], [place], DO   2 years ago Essential hypertension   [facility] [place], [place], DO      Future Appointments            In 1 month Gollan, Tollie Pizza, MD [facility], [facility]   In 2 months [place], Oralia Rud, DO [employer], [facility]   In 2 months  [employer], [facility]

## 2021-08-21 ENCOUNTER — Other Ambulatory Visit: Payer: Self-pay | Admitting: Family Medicine

## 2021-08-22 NOTE — Telephone Encounter (Signed)
Requested Prescriptions  Pending Prescriptions Disp Refills  . lisinopril (ZESTRIL) 10 MG tablet [Pharmacy Med Name: Lisinopril 10 MG Oral Tablet] 90 tablet 3    Sig: TAKE 1 TABLET BY MOUTH DAILY     Cardiovascular:  ACE Inhibitors Passed - 08/21/2021  8:53 AM      Passed - Cr in normal range and within 180 days    Creatinine, Ser  Date Value Ref Range Status  04/11/2021 1.22 0.76 - 1.27 mg/dL Final         Passed - K in normal range and within 180 days    Potassium  Date Value Ref Range Status  04/11/2021 4.2 3.5 - 5.2 mmol/L Final         Passed - Patient is not pregnant      Passed - Last BP in normal range    BP Readings from Last 1 Encounters:  04/11/21 137/72         Passed - Valid encounter within last 6 months    Recent Outpatient Visits          4 months ago Controlled type 2 diabetes mellitus with complication, without long-term current use of insulin (HCC)   [facility] [place], Megan P, DO   10 months ago Routine general medical examination at a health care facility   [facility] [place], [place]am, DO   1 year ago Primary hypertension   [facility] [place], Megan P, DO   1 year ago Routine general medical examination at a health care facility   [facility], [place], DO   2 years ago Essential hypertension   [facility] [place], [place], DO      Future Appointments            In 3 weeks Gollan, Tollie Pizza, MD [facility], [facility]   In 1 month [place], Megan P, DO [facility], [facility]   In 1 month  [employer], [facility]           . cyclobenzaprine (FLEXERIL) 10 MG tablet [employer] Med Name: Cyclobenzaprine HCl 10 MG Oral Tablet] 30 tablet 0    Sig: TAKE 1 TABLET BY MOUTH AT  BEDTIME     Not Delegated - Analgesics:  Muscle Relaxants Failed - 08/21/2021  8:53 AM      Failed - This refill cannot be delegated      Passed - Valid encounter within last 6  months    Recent Outpatient Visits          4 months ago Controlled type 2 diabetes mellitus with complication, without long-term current use of insulin (HCC)   [facility] [place], Megan P, DO   10 months ago Routine general medical examination at a health care facility   [facility] [place], [place], DO   1 year ago Primary hypertension   [facility] [place], Megan P, DO   1 year ago Routine general medical examination at a health care facility   [facility] [place], [place], DO   2 years ago Essential hypertension   [facility] [place], [place], DO      Future Appointments            In 3 weeks Gollan, Tollie Pizza, MD [facility], [facility]   In 1 month Christine, Oralia Rud, DO [employer], [facility]   In 1 month  [employer], [facility]

## 2021-08-22 NOTE — Telephone Encounter (Signed)
Requested medication (s) are due for refill today: yes  Requested medication (s) are on the active medication list: yes  Last refill:  06/16/21 #30/0  Future visit scheduled: yes  Notes to clinic:  Unable to refill per protocol, cannot delegate.    Requested Prescriptions  Pending Prescriptions Disp Refills   cyclobenzaprine (FLEXERIL) 10 MG tablet [Pharmacy Med Name: Cyclobenzaprine HCl 10 MG Oral Tablet] 30 tablet 0    Sig: TAKE 1 TABLET BY MOUTH AT  BEDTIME     Not Delegated - Analgesics:  Muscle Relaxants Failed - 08/21/2021  8:53 AM      Failed - This refill cannot be delegated      Passed - Valid encounter within last 6 months    Recent Outpatient Visits           4 months ago Controlled type 2 diabetes mellitus with complication, without long-term current use of insulin (HCC)   [facility] [place], Megan P, DO   10 months ago Routine general medical examination at a health care facility   [facility] [place], [place], DO   1 year ago Primary hypertension   [facility] [place], Megan P, DO   1 year ago Routine general medical examination at a health care facility   [facility], [place], DO   2 years ago Essential hypertension   [facility] [place], [place], DO       Future Appointments             In 3 weeks Gollan, Tollie Pizza, MD [facility], [facility]   In 1 month [place], Megan P, DO [facility], [facility]   In 1 month  [employer], [facility]            Signed Prescriptions Disp Refills   lisinopril (ZESTRIL) 10 MG tablet 100 tablet 0    Sig: TAKE 1 TABLET BY MOUTH DAILY     Cardiovascular:  ACE Inhibitors Passed - 08/21/2021  8:53 AM      Passed - Cr in normal range and within 180 days    Creatinine, Ser  Date Value Ref Range Status  04/11/2021 1.22 0.76 - 1.27 mg/dL Final         Passed - K in normal range and within 180 days    Potassium  Date  Value Ref Range Status  04/11/2021 4.2 3.5 - 5.2 mmol/L Final         Passed - Patient is not pregnant      Passed - Last BP in normal range    BP Readings from Last 1 Encounters:  04/11/21 137/72         Passed - Valid encounter within last 6 months    Recent Outpatient Visits           4 months ago Controlled type 2 diabetes mellitus with complication, without long-term current use of insulin (HCC)   [facility] [place], Megan P, DO   10 months ago Routine general medical examination at a health care facility   [facility] [place], [place], DO   1 year ago Primary hypertension   [facility] [place], Megan P, DO   1 year ago Routine general medical examination at a health care facility   [facility], [place], DO   2 years ago Essential hypertension   [facility] [place], [place], DO  Future Appointments             In 3 weeks Gollan, Tollie Pizza, MD [facility], [facility]   In 1 month [place], Oralia Rud, DO [employer], [facility]   In 1 month  [employer], [facility]

## 2021-09-03 ENCOUNTER — Other Ambulatory Visit: Payer: Self-pay | Admitting: Family Medicine

## 2021-09-06 NOTE — Telephone Encounter (Signed)
Refilled 06/01/2021 #80 1 refill. Requested Prescriptions  Pending Prescriptions Disp Refills  . meloxicam (MOBIC) 15 MG tablet [Pharmacy Med Name: Meloxicam 15 MG Oral Tablet] 100 tablet 2    Sig: TAKE 1 TABLET BY MOUTH DAILY     Analgesics:  COX2 Inhibitors Failed - 09/03/2021 10:12 PM      Failed - Manual Review: Labs are only required if the patient has taken medication for more than 8 weeks.      Passed - HGB in normal range and within 360 days    Hemoglobin  Date Value Ref Range Status  04/11/2021 15.9 13.0 - 17.7 g/dL Final         Passed - Cr in normal range and within 360 days    Creatinine, Ser  Date Value Ref Range Status  04/11/2021 1.22 0.76 - 1.27 mg/dL Final         Passed - HCT in normal range and within 360 days    Hematocrit  Date Value Ref Range Status  04/11/2021 46.5 37.5 - 51.0 % Final         Passed - AST in normal range and within 360 days    AST  Date Value Ref Range Status  04/11/2021 16 0 - 40 IU/L Final         Passed - ALT in normal range and within 360 days    ALT  Date Value Ref Range Status  04/11/2021 17 0 - 44 IU/L Final         Passed - eGFR is 30 or above and within 360 days    GFR calc Af Amer  Date Value Ref Range Status  10/09/2019 64 >59 mL/min/1.73 Final    Comment:    **[facility] currently reports eGFR in compliance with the current**   recommendations of the [employer]. [facility] will   update reporting as new guidelines are published from the NKF-ASN   Task force.    GFR calc non Af Amer  Date Value Ref Range Status  10/09/2019 55 (L) >59 mL/min/1.73 Final   eGFR  Date Value Ref Range Status  04/11/2021 63 >59 mL/min/1.73 Final         Passed - Patient is not pregnant      Passed - Valid encounter within last 12 months    Recent Outpatient Visits          4 months ago Controlled type 2 diabetes mellitus with complication, without long-term current use of insulin ([place])   [place], Megan P, DO   11 months ago Routine general medical examination at a health care facility   [place], [place], DO   1 year ago Primary hypertension   [place], [place], DO   1 year ago Routine general medical examination at a health care facility   [facility], [place], DO   2 years ago Essential hypertension   [place], Barb Merino, DO      Future Appointments            In 1 week Gollan, Kathlene November, MD [facility], [facility]   In 1 month [place], Barb Merino, DO [place], [place]   In 1 month  [employer], [place]

## 2021-09-14 ENCOUNTER — Ambulatory Visit: Payer: Medicare Other | Admitting: Cardiovascular Disease

## 2021-09-20 ENCOUNTER — Other Ambulatory Visit: Payer: Self-pay | Admitting: Family Medicine

## 2021-09-21 NOTE — Telephone Encounter (Signed)
Requested medication (s) are due for refill today:no  Requested medication (s) are on the active medication list: yes  Last refill:  04/11/21 for 90 and 3 RF  Future visit scheduled: yes  Notes to clinic:  Unable to refill per protocol, Rx request is too soon. Last RF 04/11/21 for 90 and 3 refills.     Requested Prescriptions  Pending Prescriptions Disp Refills   ASPIRIN LOW DOSE 81 MG tablet [Pharmacy Med Name: ASPIRIN 81MG EC LOW DOSE TABLETS] 90 tablet 3    Sig: TAKE 1 TABLET(81 MG) BY MOUTH DAILY     Analgesics:  NSAIDS - aspirin Passed - 09/20/2021  7:00 AM      Passed - Cr in normal range and within 360 days    Creatinine, Ser  Date Value Ref Range Status  04/11/2021 1.22 0.76 - 1.27 mg/dL Final         Passed - eGFR is 10 or above and within 360 days    GFR calc Af Amer  Date Value Ref Range Status  10/09/2019 64 >59 mL/min/1.73 Final    Comment:    **[facility] currently reports eGFR in compliance with the current**   recommendations of the [employer]. [facility] will   update reporting as new guidelines are published from the NKF-ASN   Task force.    GFR calc non Af Amer  Date Value Ref Range Status  10/09/2019 55 (L) >59 mL/min/1.73 Final   eGFR  Date Value Ref Range Status  04/11/2021 63 >59 mL/min/1.73 Final         Passed - Patient is not pregnant      Passed - Valid encounter within last 12 months    Recent Outpatient Visits           5 months ago Controlled type 2 diabetes mellitus with complication, without long-term current use of insulin ([place])   [place], Megan P, DO   11 months ago Routine general medical examination at a health care facility   [place], [place], DO   1 year ago Primary hypertension   [place], [place], DO   1 year ago Routine general medical examination at a health care facility   [place], [place], DO   2 years ago  Essential hypertension   [place], Barb Merino, DO       Future Appointments             In 3 weeks [place], Barb Merino, DO [place], [place]   In 3 weeks  [facility], [facility]   In 2 months Gollan, Kathlene November, MD [place]. [place]

## 2021-10-11 ENCOUNTER — Other Ambulatory Visit: Payer: Self-pay | Admitting: Family Medicine

## 2021-10-12 ENCOUNTER — Encounter: Payer: Self-pay | Admitting: Family Medicine

## 2021-10-12 ENCOUNTER — Ambulatory Visit (INDEPENDENT_AMBULATORY_CARE_PROVIDER_SITE_OTHER): Payer: Medicare Other | Admitting: Family Medicine

## 2021-10-12 VITALS — BP 135/68 | HR 60 | Temp 98.1°F | Ht 74.0 in | Wt 224.7 lb

## 2021-10-12 DIAGNOSIS — I10 Essential (primary) hypertension: Secondary | ICD-10-CM

## 2021-10-12 DIAGNOSIS — Z0000 Encounter for general adult medical examination without abnormal findings: Secondary | ICD-10-CM

## 2021-10-12 DIAGNOSIS — D692 Other nonthrombocytopenic purpura: Secondary | ICD-10-CM

## 2021-10-12 DIAGNOSIS — E039 Hypothyroidism, unspecified: Secondary | ICD-10-CM

## 2021-10-12 DIAGNOSIS — E118 Type 2 diabetes mellitus with unspecified complications: Secondary | ICD-10-CM | POA: Diagnosis not present

## 2021-10-12 DIAGNOSIS — R5382 Chronic fatigue, unspecified: Secondary | ICD-10-CM

## 2021-10-12 DIAGNOSIS — N1831 Chronic kidney disease, stage 3a: Secondary | ICD-10-CM | POA: Diagnosis not present

## 2021-10-12 DIAGNOSIS — R3911 Hesitancy of micturition: Secondary | ICD-10-CM | POA: Diagnosis not present

## 2021-10-12 DIAGNOSIS — E782 Mixed hyperlipidemia: Secondary | ICD-10-CM

## 2021-10-12 LAB — URINALYSIS, ROUTINE W REFLEX MICROSCOPIC
Bilirubin, UA: NEGATIVE
Glucose, UA: NEGATIVE
Ketones, UA: NEGATIVE
Nitrite, UA: NEGATIVE
Protein,UA: NEGATIVE
Specific Gravity, UA: 1.01 (ref 1.005–1.030)
Urobilinogen, Ur: 0.2 mg/dL (ref 0.2–1.0)
pH, UA: 6 (ref 5.0–7.5)

## 2021-10-12 LAB — BAYER DCA HB A1C WAIVED: HB A1C (BAYER DCA - WAIVED): 5.6 % (ref 4.8–5.6)

## 2021-10-12 LAB — MICROSCOPIC EXAMINATION

## 2021-10-12 LAB — MICROALBUMIN, URINE WAIVED
Creatinine, Urine Waived: 50 mg/dL (ref 10–300)
Microalb, Ur Waived: 30 mg/L — ABNORMAL HIGH (ref 0–19)

## 2021-10-12 MED ORDER — METFORMIN HCL 500 MG PO TABS: 500.0000 mg | ORAL_TABLET | Freq: Every day | ORAL | 1 refills | Status: DC

## 2021-10-12 MED ORDER — LISINOPRIL 10 MG PO TABS: 10.0000 mg | ORAL_TABLET | Freq: Every day | ORAL | 1 refills | Status: DC

## 2021-10-12 MED ORDER — OMEPRAZOLE 40 MG PO CPDR: 40.0000 mg | DELAYED_RELEASE_CAPSULE | Freq: Every day | ORAL | 3 refills | Status: DC

## 2021-10-12 MED ORDER — ROSUVASTATIN CALCIUM 5 MG PO TABS: 5.0000 mg | ORAL_TABLET | ORAL | 0 refills | Status: DC

## 2021-10-12 MED ORDER — MELOXICAM 15 MG PO TABS: ORAL_TABLET | ORAL | 3 refills | Status: DC

## 2021-10-12 NOTE — Assessment & Plan Note (Signed)
Doing great with A1c of 5.6. Continue current regimen. Call with any concerns.

## 2021-10-12 NOTE — Assessment & Plan Note (Signed)
Under good control on current regimen. Continue current regimen. Continue to monitor. Call with any concerns. Refills given. Labs drawn today.   

## 2021-10-12 NOTE — Assessment & Plan Note (Signed)
Reassured patient. 

## 2021-10-12 NOTE — Progress Notes (Signed)
Interpreted by me today. Sinus bradycardia at 58bpm. Poor baseline. No ST segment changes.

## 2021-10-12 NOTE — Assessment & Plan Note (Signed)
Rechecking labs today. Await results. Treat as needed.  °

## 2021-10-12 NOTE — Progress Notes (Signed)
BP 135/68   Pulse 60   Temp 98.1 F (36.7 C)   Ht 6\' 2"  (1.88 m)   Wt 224 lb 11.2 oz (101.9 kg)   SpO2 97%   BMI 28.85 kg/m    Subjective:    Patient ID: Kenneth Johnston, male    DOB: 1950/01/27, 71 y.o.   MRN: [id number]  HPI: Kenneth Johnston is a 71 y.o. male presenting on 10/12/2021 for comprehensive medical examination. Current medical complaints include:  DIABETES Hypoglycemic episodes:no Polydipsia/polyuria: no Visual disturbance: no Chest pain: no Paresthesias: no Glucose Monitoring: no  Accucheck frequency: Not Checking Taking Insulin?: no Blood Pressure Monitoring: not checking Retinal Examination: Up to Date Foot Exam: Up to Date Diabetic Education: Completed Pneumovax:  Declined Influenza: Declined Aspirin: yes  HYPERTENSION / HYPERLIPIDEMIA Satisfied with current treatment? yes Duration of hypertension: chronic BP monitoring frequency: a few times a month BP medication side effects: no Past BP meds: lisinopril Duration of hyperlipidemia: chronic Cholesterol medication side effects: no Cholesterol supplements: none Past cholesterol medications: crestor Medication compliance: excellent compliance Aspirin: no Recent stressors: no Recurrent headaches: no Visual changes: no Palpitations: no Dyspnea: no Chest pain: no Lower extremity edema: no Dizzy/lightheaded: no  HYPOTHYROIDISM Thyroid control status:exacerbated Satisfied with current treatment? no Medication side effects: no Medication compliance: excellent compliance Recent dose adjustment:no Fatigue: yes Cold intolerance: yes Heat intolerance: no Weight gain: no Weight loss: yes Constipation: no Diarrhea/loose stools: no Palpitations: no Lower extremity edema: no Anxiety/depressed mood: no  FATIGUE Duration:  about a month Severity: moderate  Onset: sudden Context when symptoms started:  unknown Symptoms improve with rest: no  Depressive symptoms: no Stress/anxiety:  no Insomnia: no  Snoring: no Observed apnea by bed partner: no Daytime hypersomnolence:no Wakes feeling refreshed: yes History of sleep study: no Dysnea on exertion:  yes Orthopnea/PND: no Chest pain: no Chronic cough: no Lower extremity edema: no Arthralgias:no Myalgias: no Weakness: no Rash: no  Interim Problems from his last visit: no  Depression Screen done today and results listed below:     10/12/2021    9:39 AM 04/11/2021   10:32 AM 10/15/2020    1:02 PM 10/13/2019    2:32 PM 10/09/2019   10:31 AM  Depression screen PHQ 2/9  Decreased Interest 0 0 0 0 0  Down, Depressed, Hopeless 0 0 0 0 0  PHQ - 2 Score 0 0 0 0 0  Altered sleeping 0 0   0  Tired, decreased energy 3 0   0  Change in appetite 0 0   0  Feeling bad or failure about yourself  0 0   0  Trouble concentrating 0 0   0  Moving slowly or fidgety/restless 0 0   0  Suicidal thoughts 0 0   0  PHQ-9 Score 3 0   0  Difficult doing work/chores Not difficult at all    Not difficult at all    Past Medical History:  Past Medical History:  Diagnosis Date   Allergy    Arthritis    CKD (chronic kidney disease), stage III (HCC)    Coronary artery calcification    Hyperlipidemia    Hypertension    Hypothyroidism    Umbilical hernia 6/96/2952    Surgical History:  Past Surgical History:  Procedure Laterality Date   KNEE CARTILAGE SURGERY Bilateral     Medications:  Current Outpatient Medications on File Prior to Visit  Medication Sig   aspirin EC 81 MG tablet Take  1 tablet (81 mg total) by mouth daily.   cyclobenzaprine (FLEXERIL) 10 MG tablet TAKE 1 TABLET BY MOUTH AT  BEDTIME   levothyroxine (SYNTHROID) 75 MCG tablet TAKE 1 TABLET(75 MCG) BY MOUTH DAILY BEFORE BREAKFAST   No current facility-administered medications on file prior to visit.    Allergies:  No Known Allergies  Social History:  Social History   Socioeconomic History   Marital status: Married    Spouse name: Not on file   Number  of children: Not on file   Years of education: Not on file   Highest education level: Not on file  Occupational History   Not on file  Tobacco Use   Smoking status: Former    Types: Cigarettes    Quit date: 03/04/1995    Years since quitting: 26.6   Smokeless tobacco: Never  Vaping Use   Vaping Use: Never used  Substance and Sexual Activity   Alcohol use: Yes    Comment: very seldom    Drug use: No   Sexual activity: Not Currently  Other Topics Concern   Not on file  Social History Narrative   [place]    Social Determinants of Health   Financial Resource Strain: Low Risk  (10/15/2020)   Overall Financial Resource Strain (CARDIA)    Difficulty of Paying Living Expenses: Not hard at all  Food Insecurity: No Food Insecurity (10/15/2020)   Hunger Vital Sign    Worried About Running Out of Food in the Last Year: Never true    [place] in the Last Year: Never true  Transportation Needs: No Transportation Needs (10/15/2020)   PRAPARE - Hydrologist (Medical): No    Lack of Transportation (Non-Medical): No  Physical Activity: Sufficiently Active (10/15/2020)   Exercise Vital Sign    Days of Exercise per Week: 3 days    Minutes of Exercise per Session: 60 min  Stress: No Stress Concern Present (10/15/2020)   [place]    Feeling of Stress : Not at all  Social Connections: Moderately Integrated (05/17/2017)   Social Connection and Isolation Panel [NHANES]    Frequency of Communication with Friends and Family: More than three times a week    Frequency of Social Gatherings with Friends and Family: More than three times a week    Attends Religious Services: More than 4 times per year    Active Member of [employer] or Organizations: No    Attends Archivist Meetings: Never    Marital Status: Married  Human resources officer Violence: Not At Risk (05/17/2017)   Humiliation, Afraid, Rape, and  Kick questionnaire    Fear of Current or Ex-Partner: No    Emotionally Abused: No    Physically Abused: No    Sexually Abused: No   Social History   Tobacco Use  Smoking Status Former   Types: Cigarettes   Quit date: 03/04/1995   Years since quitting: 26.6  Smokeless Tobacco Never   Social History   Substance and Sexual Activity  Alcohol Use Yes   Comment: very seldom     Family History:  Family History  Problem Relation Age of Onset   Kidney failure Mother    Aneurysm Father        brain   Kidney disease Paternal Grandmother    Heart attack Paternal Grandfather     Past medical history, surgical history, medications, allergies, family history and social history  reviewed with patient today and changes made to appropriate areas of the chart.   Review of Systems  Constitutional: Negative.   HENT: Negative.    Eyes: Negative.   Respiratory: Negative.    Cardiovascular: Negative.   Gastrointestinal: Negative.   Genitourinary: Negative.   Musculoskeletal: Negative.   Skin: Negative.   Neurological: Negative.   Endo/Heme/Allergies:  Negative for environmental allergies and polydipsia. Bruises/bleeds easily.  Psychiatric/Behavioral: Negative.     All other ROS negative except what is listed above and in the HPI.      Objective:    BP 135/68   Pulse 60   Temp 98.1 F (36.7 C)   Ht 6\' 2"  (1.88 m)   Wt 224 lb 11.2 oz (101.9 kg)   SpO2 97%   BMI 28.85 kg/m   Wt Readings from Last 3 Encounters:  10/12/21 224 lb 11.2 oz (101.9 kg)  04/11/21 236 lb (107 kg)  10/15/20 232 lb (105.2 kg)    Physical Exam Vitals and nursing note reviewed.  Constitutional:      General: He is not in acute distress.    Appearance: Normal appearance. He is not ill-appearing, toxic-appearing or diaphoretic.  HENT:     Head: Normocephalic and atraumatic.     Right Ear: Tympanic membrane, ear canal and external ear normal. There is no impacted cerumen.     Left Ear: Tympanic  membrane, ear canal and external ear normal. There is no impacted cerumen.     Nose: Nose normal. No congestion or rhinorrhea.     Mouth/Throat:     Mouth: Mucous membranes are moist.     Pharynx: Oropharynx is clear. No oropharyngeal exudate or posterior oropharyngeal erythema.  Eyes:     General: No scleral icterus.       Right eye: No discharge.        Left eye: No discharge.     Extraocular Movements: Extraocular movements intact.     Conjunctiva/sclera: Conjunctivae normal.     Pupils: Pupils are equal, round, and reactive to light.  Neck:     Vascular: No carotid bruit.  Cardiovascular:     Rate and Rhythm: Normal rate and regular rhythm.     Pulses: Normal pulses.     Heart sounds: No murmur heard.    No friction rub. No gallop.  Pulmonary:     Effort: Pulmonary effort is normal. No respiratory distress.     Breath sounds: Normal breath sounds. No stridor. No wheezing, rhonchi or rales.  Chest:     Chest wall: No tenderness.  Abdominal:     General: Abdomen is flat. Bowel sounds are normal. There is no distension.     Palpations: Abdomen is soft. There is no mass.     Tenderness: There is no abdominal tenderness. There is no right CVA tenderness, left CVA tenderness, guarding or rebound.     Hernia: No hernia is present.  Genitourinary:    Comments: Genital exam deferred with shared decision making Musculoskeletal:        General: No swelling, tenderness, deformity or signs of injury.     Cervical back: Normal range of motion and neck supple. No rigidity. No muscular tenderness.     Right lower leg: No edema.     Left lower leg: No edema.  Lymphadenopathy:     Cervical: No cervical adenopathy.  Skin:    General: Skin is warm and dry.     Capillary Refill: Capillary refill takes less than 2  seconds.     Coloration: Skin is not jaundiced or pale.     Findings: No bruising, erythema, lesion or rash.  Neurological:     General: No focal deficit present.     Mental  Status: He is alert and oriented to person, place, and time.     Cranial Nerves: No cranial nerve deficit.     Sensory: No sensory deficit.     Motor: No weakness.     Coordination: Coordination normal.     Gait: Gait normal.     Deep Tendon Reflexes: Reflexes normal.  Psychiatric:        Mood and Affect: Mood normal.        Behavior: Behavior normal.        Thought Content: Thought content normal.        Judgment: Judgment normal.     Results for orders placed or performed in visit on 07/21/21  HM DIABETES EYE EXAM  Result Value Ref Range   HM Diabetic Eye Exam No Retinopathy No Retinopathy      Assessment & Plan:   Problem List Items Addressed This Visit       Cardiovascular and Mediastinum   HTN (hypertension)    Under good control on current regimen. Continue current regimen. Continue to monitor. Call with any concerns. Refills given. Labs drawn today.       Relevant Medications   lisinopril (ZESTRIL) 10 MG tablet   rosuvastatin (CRESTOR) 5 MG tablet   Other Relevant Orders   Comprehensive metabolic panel   CBC with Differential/Platelet   Urinalysis, Routine w reflex microscopic   Microalbumin, Urine Waived   Senile purpura ([place])    Reassured patient.       Relevant Medications   lisinopril (ZESTRIL) 10 MG tablet   rosuvastatin (CRESTOR) 5 MG tablet     Endocrine   Hypothyroidism    Rechecking labs today. Await results. Treat as needed.       Relevant Orders   Comprehensive metabolic panel   CBC with Differential/Platelet   TSH   Controlled type 2 diabetes mellitus with complication, without long-term current use of insulin ([place])    Doing great with A1c of 5.6. Continue current regimen. Call with any concerns.       Relevant Medications   lisinopril (ZESTRIL) 10 MG tablet   metFORMIN (GLUCOPHAGE) 500 MG tablet   rosuvastatin (CRESTOR) 5 MG tablet   Other Relevant Orders   Bayer DCA Hb A1c Waived   Comprehensive metabolic panel   CBC with  Differential/Platelet     Genitourinary   Chronic kidney disease, stage 3a ([place])    Rechecking labs today. Await results.         Other   Hyperlipidemia    Under good control on current regimen. Continue current regimen. Continue to monitor. Call with any concerns. Refills given. Labs drawn today.       Relevant Medications   lisinopril (ZESTRIL) 10 MG tablet   rosuvastatin (CRESTOR) 5 MG tablet   Other Relevant Orders   Comprehensive metabolic panel   CBC with Differential/Platelet   Lipid Panel w/o Chol/HDL Ratio   Other Visit Diagnoses     Routine general medical examination at a health care facility    -  Primary   Vaccines up to date/declined. Mono labs checked today. Colonoscopy up to date. Continue diet and exercise. Call with any concerns.    Hesitancy       Labs drawn today. Await results.  Relevant Orders   PSA   Chronic fatigue       Same symptoms as before thyroid diagnosed. EKG normal. Await labs. Continue to monitor.    Relevant Orders   EKG 12-Lead (Completed)        Discussed aspirin prophylaxis for myocardial infarction prevention and decision was made to continue ASA  LABORATORY TESTING:  Health maintenance labs ordered today as discussed above.   The natural history of prostate cancer and ongoing controversy regarding screening and potential treatment outcomes of prostate cancer has been discussed with the patient. The meaning of a false positive PSA and a false negative PSA has been discussed. He indicates understanding of the limitations of this screening test and wishes to proceed with screening PSA testing.   IMMUNIZATIONS:   - Tdap: Tetanus vaccination status reviewed: Declined. - Influenza: Refused - Pneumovax: Refused - Prevnar: Refused - COVID: Up to date - HPV: Not applicable - Shingrix vaccine: Refused  SCREENING: - Colonoscopy: Up to date  Discussed with patient purpose of the colonoscopy is to detect colon cancer at  curable precancerous or early stages   PATIENT COUNSELING:    Sexuality: Discussed sexually transmitted diseases, partner selection, use of condoms, avoidance of unintended pregnancy  and contraceptive alternatives.   Advised to avoid cigarette smoking.  I discussed with the patient that most people either abstain from alcohol or drink within safe limits (<=14/week and <=4 drinks/occasion for males, <=7/weeks and <= 3 drinks/occasion for females) and that the risk for alcohol disorders and other health effects rises proportionally with the number of drinks per week and how often a drinker exceeds daily limits.  Discussed cessation/primary prevention of drug use and availability of treatment for abuse.   Diet: Encouraged to adjust caloric intake to maintain  or achieve ideal body weight, to reduce intake of dietary saturated fat and total fat, to limit sodium intake by avoiding high sodium foods and not adding table salt, and to maintain adequate dietary potassium and calcium preferably from fresh fruits, vegetables, and low-fat dairy products.    stressed the importance of regular exercise  Injury prevention: Discussed safety belts, safety helmets, smoke detector, smoking near bedding or upholstery.   Dental health: Discussed importance of regular tooth brushing, flossing, and dental visits.   Follow up plan: NEXT PREVENTATIVE PHYSICAL DUE IN 1 YEAR. Return in about 6 weeks (around 11/23/2021).

## 2021-10-12 NOTE — Assessment & Plan Note (Signed)
Rechecking labs today. Await results.  

## 2021-10-12 NOTE — Telephone Encounter (Signed)
Requested medication (s) are due for refill today: For review  Requested medication (s) are on the active medication list: yes    Last refill: 06/07/21  #90  3 refills  Future visit scheduled Yes  Notes to clinic:Pt has appt today at 940. Please review. Thank you.  Requested Prescriptions  Pending Prescriptions Disp Refills   levothyroxine (SYNTHROID) 75 MCG tablet [Pharmacy Med Name: Levothyroxine Sodium 75 MCG Oral Tablet] 100 tablet 2    Sig: TAKE 1 TABLET BY MOUTH DAILY  BEFORE BREAKFAST     Endocrinology:  Hypothyroid Agents Passed - 10/11/2021 10:56 PM      Passed - TSH in normal range and within 360 days    TSH  Date Value Ref Range Status  04/11/2021 3.100 0.450 - 4.500 uIU/mL Final         Passed - Valid encounter within last 12 months    Recent Outpatient Visits           6 months ago Controlled type 2 diabetes mellitus with complication, without long-term current use of insulin ([place])   [place], Megan P, DO   1 year ago Routine general medical examination at a health care facility   [place], [place], DO   1 year ago Primary hypertension   [place], [place] P, DO   2 years ago Routine general medical examination at a health care facility   [place], [place], DO   2 years ago Essential hypertension   [place], [place], DO       Future Appointments             Today Valerie Roys, DO [place], [place]   In 5 days  [employer], [place]   In 2 months Gollan, Kathlene November, MD [place]. [place]

## 2021-10-13 LAB — COMPREHENSIVE METABOLIC PANEL
ALT: 23 IU/L (ref 0–44)
AST: 21 IU/L (ref 0–40)
Albumin/Globulin Ratio: 1.8 (ref 1.2–2.2)
Albumin: 4.3 g/dL (ref 3.8–4.8)
Alkaline Phosphatase: 57 IU/L (ref 44–121)
BUN/Creatinine Ratio: 11 (ref 10–24)
BUN: 14 mg/dL (ref 8–27)
Bilirubin Total: 0.5 mg/dL (ref 0.0–1.2)
CO2: 24 mmol/L (ref 20–29)
Calcium: 9.6 mg/dL (ref 8.6–10.2)
Chloride: 106 mmol/L (ref 96–106)
Creatinine, Ser: 1.23 mg/dL (ref 0.76–1.27)
Globulin, Total: 2.4 g/dL (ref 1.5–4.5)
Glucose: 105 mg/dL — ABNORMAL HIGH (ref 70–99)
Potassium: 4 mmol/L (ref 3.5–5.2)
Sodium: 142 mmol/L (ref 134–144)
Total Protein: 6.7 g/dL (ref 6.0–8.5)
eGFR: 62 mL/min/{1.73_m2} (ref >59)

## 2021-10-13 LAB — CBC WITH DIFFERENTIAL/PLATELET
Basophils Absolute: 0 10*3/uL (ref 0.0–0.2)
Basos: 1 %
EOS (ABSOLUTE): 0.1 10*3/uL (ref 0.0–0.4)
Eos: 2 %
Hematocrit: 48.2 % (ref 37.5–51.0)
Hemoglobin: 16.3 g/dL (ref 13.0–17.7)
Immature Grans (Abs): 0 10*3/uL (ref 0.0–0.1)
Immature Granulocytes: 0 %
Lymphocytes Absolute: 1.3 10*3/uL (ref 0.7–3.1)
Lymphs: 19 %
MCH: 29 pg (ref 26.6–33.0)
MCHC: 33.8 g/dL (ref 31.5–35.7)
MCV: 86 fL (ref 79–97)
Monocytes Absolute: 0.5 10*3/uL (ref 0.1–0.9)
Monocytes: 8 %
Neutrophils Absolute: 4.8 10*3/uL (ref 1.4–7.0)
Neutrophils: 70 %
Platelets: 196 10*3/uL (ref 150–450)
RBC: 5.62 x10E6/uL (ref 4.14–5.80)
RDW: 13.2 % (ref 11.6–15.4)
WBC: 6.8 10*3/uL (ref 3.4–10.8)

## 2021-10-13 LAB — PSA: Prostate Specific Ag, Serum: 3.5 ng/mL (ref 0.0–4.0)

## 2021-10-13 LAB — LIPID PANEL W/O CHOL/HDL RATIO
Cholesterol, Total: 149 mg/dL (ref 100–199)
HDL: 35 mg/dL — ABNORMAL LOW (ref >39)
LDL Chol Calc (NIH): 83 mg/dL (ref 0–99)
Triglycerides: 181 mg/dL — ABNORMAL HIGH (ref 0–149)
VLDL Cholesterol Cal: 31 mg/dL (ref 5–40)

## 2021-10-13 LAB — TSH: TSH: 2.57 u[IU]/mL (ref 0.450–4.500)

## 2021-10-17 ENCOUNTER — Ambulatory Visit: Payer: Medicare Other

## 2021-11-19 ENCOUNTER — Other Ambulatory Visit: Payer: Self-pay | Admitting: Family Medicine

## 2021-11-21 NOTE — Telephone Encounter (Signed)
Refilled 10/12/2021 #100 1 rf. Requested Prescriptions  Pending Prescriptions Disp Refills   lisinopril (ZESTRIL) 10 MG tablet [Pharmacy Med Name: LISINOPRIL 10MG  TABLETS] 90 tablet     Sig: TAKE 1 TABLET(10 MG) BY MOUTH DAILY     Cardiovascular:  ACE Inhibitors Passed - 11/19/2021  7:00 AM      Passed - Cr in normal range and within 180 days    Creatinine, Ser  Date Value Ref Range Status  10/12/2021 1.23 0.76 - 1.27 mg/dL Final         Passed - K in normal range and within 180 days    Potassium  Date Value Ref Range Status  10/12/2021 4.0 3.5 - 5.2 mmol/L Final         Passed - Patient is not pregnant      Passed - Last BP in normal range    BP Readings from Last 1 Encounters:  10/12/21 135/68         Passed - Valid encounter within last 6 months    Recent Outpatient Visits           1 month ago Routine general medical examination at a health care facility   [facility], [place] P, DO   7 months ago Controlled type 2 diabetes mellitus with complication, without long-term current use of insulin ([place])   [place], Megan P, DO   1 year ago Routine general medical examination at a health care facility   [place], [place], DO   1 year ago Primary hypertension   [place], [place] P, DO   2 years ago Routine general medical examination at a health care facility   [place], [place], DO       Future Appointments             In 3 weeks Gollan, Kathlene November, MD [place]. [place]

## 2021-11-23 ENCOUNTER — Ambulatory Visit: Payer: Medicare Other | Admitting: Family Medicine

## 2021-12-07 ENCOUNTER — Other Ambulatory Visit: Payer: Self-pay | Admitting: Family Medicine

## 2021-12-09 NOTE — Telephone Encounter (Signed)
Refilled 06/07/2021 #[id number] Requested Prescriptions  Pending Prescriptions Disp Refills   levothyroxine (SYNTHROID) 75 MCG tablet [Pharmacy Med Name: Levothyroxine Sodium 75 MCG Oral Tablet] 60 tablet 5    Sig: TAKE 1 TABLET BY MOUTH DAILY  BEFORE BREAKFAST     Endocrinology:  Hypothyroid Agents Passed - 12/07/2021 11:22 PM      Passed - TSH in normal range and within 360 days    TSH  Date Value Ref Range Status  10/12/2021 2.570 0.450 - 4.500 uIU/mL Final         Passed - Valid encounter within last 12 months    Recent Outpatient Visits           1 month ago Routine general medical examination at a health care facility   [facility], [place] P, DO   8 months ago Controlled type 2 diabetes mellitus with complication, without long-term current use of insulin (HCC)   [facility] [place], Megan P, DO   1 year ago Routine general medical examination at a health care facility   [facility] [place], [place], DO   1 year ago Primary hypertension   [facility] [place], Megan P, DO   2 years ago Routine general medical examination at a health care facility   [facility] [place], [place], DO       Future Appointments             In 4 days Gollan, Tollie Pizza, MD [facility] [place]

## 2021-12-10 NOTE — Progress Notes (Signed)
Date:  12/13/2021   ID:  Kenneth Johnston, DOB [birth year], MRN [id number]  Patient Location:  [address] [name] [address]   Provider location:   [facility], [place] office  PCP:  [name], DO  Cardiologist:  Hubbard Robinson [facility]  Chief Complaint  Patient presents with   12 month follow up     "Doing well." Medications reviewed by the patient verbally.     History of Present Illness:    Kenneth Johnston is a 71 y.o. male with  past medical history of Smoking pipes, stopped 20 years ago Obesity Hypertension Chronic  back pain Coronary calcium score of 582 . This was 43 th percentile for age and sex matched control. Stress test February 2019 with no significant ischemia Who presents for f/u of his shortness of breath on exertion, CAD  Last seen by myself in clinic October 2018 Seen by telemetry visit September 2020 Seen by one of our providers April 2021  Lower back pain, Prior surgery Exercises at the [facility] [employer] in the summer  Reports only taking Crestor once a week May have had myalgias on Crestor daily  BP at home 130 systolic On lisinopril 10 daily Working HVAC  Denies chest pain or shortness of breath concerning for angina  Lab work reviewed with him Total chol 149, LDL 83 CR 1.23,   CT coronary calcium study: 01/2017 Score of 582  EKG personally reviewed by myself on todays visit Normal sinus rhythm rate 63 bpm no significant ST-T wave changes  Other past medical history reviewed  Prior CV studies:    Stress test 02/2018  Normal pharmacologic myocardial perfusion without ischemia or scar. The left ventricular ejection fraction is hyperdynamic (>65%). This is a low risk study. Poor excercise capacity; study was converted from exercise due to fatigue after 3:37 minutes. Frequent PVCs and brief run of SVT noted during exercise. Correlate clinically.   Past Medical History:  Diagnosis Date   Allergy     Arthritis    CKD (chronic kidney disease), stage III (HCC)    Coronary artery calcification    Hyperlipidemia    Hypertension    Hypothyroidism    Umbilical hernia 03/04/2015   Past Surgical History:  Procedure Laterality Date   KNEE CARTILAGE SURGERY Bilateral     Allergies:   Patient has no known allergies.   Social History   Tobacco Use   Smoking status: Former    Types: Cigarettes    Quit date: 03/04/1995    Years since quitting: 26.7   Smokeless tobacco: Never  Vaping Use   Vaping Use: Never used  Substance Use Topics   Alcohol use: Yes    Comment: very seldom    Drug use: No     Current Outpatient Medications on File Prior to Visit  Medication Sig Dispense Refill   aspirin EC 81 MG tablet Take 1 tablet (81 mg total) by mouth daily. 90 tablet 3   cyclobenzaprine (FLEXERIL) 10 MG tablet TAKE 1 TABLET BY MOUTH AT  BEDTIME 30 tablet 0   levothyroxine (SYNTHROID) 75 MCG tablet TAKE 1 TABLET(75 MCG) BY MOUTH DAILY BEFORE BREAKFAST 90 tablet 3   lisinopril (ZESTRIL) 10 MG tablet Take 1 tablet (10 mg total) by mouth daily. 100 tablet 1   meloxicam (MOBIC) 15 MG tablet TAKE 1 TABLET BY MOUTH DAILY 100 tablet 3   metFORMIN (GLUCOPHAGE) 500 MG tablet Take 1 tablet (500 mg total)  by mouth daily. 100 tablet 1   omeprazole (PRILOSEC) 40 MG capsule Take 1 capsule (40 mg total) by mouth daily. 100 capsule 3   rosuvastatin (CRESTOR) 5 MG tablet Take 1 tablet (5 mg total) by mouth once a week. 52 tablet 0   No current facility-administered medications on file prior to visit.     Family Hx: The patient's family history includes Aneurysm in his father; Heart attack in his paternal grandfather; Kidney disease in his paternal grandmother; Kidney failure in his mother.  ROS:   Please see the history of present illness.    Review of Systems  Constitutional:  Positive for diaphoresis.  HENT: Negative.    Respiratory:  Positive for shortness of breath.   Cardiovascular: Negative.    Gastrointestinal: Negative.   Musculoskeletal: Negative.   Neurological: Negative.   Psychiatric/Behavioral: Negative.    All other systems reviewed and are negative.    Labs/Other Tests and Data Reviewed:    Recent Labs: 10/12/2021: ALT 23; BUN 14; Creatinine, Ser 1.23; Hemoglobin 16.3; Platelets 196; Potassium 4.0; Sodium 142; TSH 2.570   Recent Lipid Panel Lab Results  Component Value Date/Time   CHOL 149 10/12/2021 10:00 AM   TRIG 181 (H) 10/12/2021 10:00 AM   HDL 35 (L) 10/12/2021 10:00 AM   LDLCALC 83 10/12/2021 10:00 AM    Wt Readings from Last 3 Encounters:  12/13/21 232 lb (105.2 kg)  10/12/21 224 lb 11.2 oz (101.9 kg)  04/11/21 236 lb (107 kg)     Exam:    Vital Signs: Vital signs may also be detailed in the HPI BP (!) 140/72 (BP Location: Left Arm, Patient Position: Sitting, Cuff Size: Normal)   Pulse 63   Ht 6\' 2"  (1.88 m)   Wt 232 lb (105.2 kg)   SpO2 96%   BMI 29.79 kg/m   Constitutional:  oriented to person, place, and time. No distress.  HENT:  Head: Grossly normal Eyes:  no discharge. No scleral icterus.  Neck: No JVD, no carotid bruits  Cardiovascular: Regular rate and rhythm, no murmurs appreciated Pulmonary/Chest: Clear to auscultation bilaterally, no wheezes or rails Abdominal: Soft.  no distension.  no tenderness.  Musculoskeletal: Normal range of motion Neurological:  normal muscle tone. Coordination normal. No atrophy Skin: Skin warm and dry Psychiatric: normal affect, pleasant  ASSESSMENT & PLAN:    Problem List Items Addressed This Visit       Cardiology Problems   HTN (hypertension)   Relevant Orders   EKG 12-Lead   Hyperlipidemia   Relevant Orders   EKG 12-Lead     Other   Chronic kidney disease, stage 3a (HCC)   Controlled type 2 diabetes mellitus with complication, without long-term current use of insulin (HCC)   Relevant Orders   EKG 12-Lead   Other Visit Diagnoses     Coronary artery disease of native artery of  native heart with stable angina pectoris (HCC)    -  Primary   Relevant Orders   EKG 12-Lead     Shortness of breath Reports breathing stable prior smoking history,  deconditioning  stress test February 2019 with no ischemia. No further testing needed at this time  Hyperlipidemia Difficulty tolerating Crestor, taking 5 mg once a week Numbers discussed with him, recommend he try to take this every other day with titration up to daily if tolerated If able to tolerate increased frequency, may need to add Zetia 10 mg daily  Essential hypertension Blood pressure is well controlled  on today's visit. No changes made to the medications.    Total encounter time more than 30 minutes  Greater than 50% was spent in counseling and coordination of care with the patient   Signed, Julien Nordmann, MD  [facility] [address], [place], [place] [address]

## 2021-12-13 ENCOUNTER — Ambulatory Visit: Payer: Medicare Other | Attending: Cardiovascular Disease | Admitting: Cardiovascular Disease

## 2021-12-13 ENCOUNTER — Encounter: Payer: Self-pay | Admitting: Cardiovascular Disease

## 2021-12-13 VITALS — BP 140/72 | HR 63 | Ht 74.0 in | Wt 232.0 lb

## 2021-12-13 DIAGNOSIS — I10 Essential (primary) hypertension: Secondary | ICD-10-CM | POA: Diagnosis not present

## 2021-12-13 DIAGNOSIS — E118 Type 2 diabetes mellitus with unspecified complications: Secondary | ICD-10-CM

## 2021-12-13 DIAGNOSIS — N1831 Chronic kidney disease, stage 3a: Secondary | ICD-10-CM

## 2021-12-13 DIAGNOSIS — I25118 Atherosclerotic heart disease of native coronary artery with other forms of angina pectoris: Secondary | ICD-10-CM | POA: Diagnosis not present

## 2021-12-13 DIAGNOSIS — E782 Mixed hyperlipidemia: Secondary | ICD-10-CM | POA: Diagnosis not present

## 2021-12-13 MED ORDER — ROSUVASTATIN CALCIUM 5 MG PO TABS: ORAL_TABLET | ORAL | 0 refills | Status: DC

## 2021-12-13 NOTE — Patient Instructions (Addendum)
Medication Instructions:  Please try to increase the crestor up to every other day If you get muscle problems, call the office, we could try zetia  If you need a refill on your cardiac medications before your next appointment, please call your pharmacy.   Lab work: No new labs needed  Testing/Procedures: No new testing needed  Follow-Up: At [facility], you and your health needs are our priority.  As part of our continuing mission to provide you with exceptional heart care, we have created designated Provider Care Teams.  These Care Teams include your primary Cardiologist (physician) and Advanced Practice Providers (APPs -  Physician Assistants and Nurse Practitioners) who all work together to provide you with the care you need, when you need it.  You will need a follow up appointment in 12 months  Providers on your designated Care Team:   Nicolasa Ducking, NP Eula Listen, PA-C Cadence Fransico Michael, [place]  COVID-19 Vaccine Information can be found at: [email or web address] For questions related to vaccine distribution or appointments, please email [email or web address] or call [phone].

## 2022-02-07 ENCOUNTER — Other Ambulatory Visit: Payer: Self-pay | Admitting: Cardiovascular Disease

## 2022-03-10 ENCOUNTER — Other Ambulatory Visit: Payer: Self-pay | Admitting: Family Medicine

## 2022-03-10 ENCOUNTER — Other Ambulatory Visit: Payer: Self-pay | Admitting: Cardiovascular Disease

## 2022-03-10 NOTE — Telephone Encounter (Signed)
Requested medication (s) are due for refill today -yes  Requested medication (s) are on the active medication list -yes  Future visit scheduled -no  Last refill: 08/22/21 #30   Notes to clinic: non delegated Rx  Requested Prescriptions  Pending Prescriptions Disp Refills   cyclobenzaprine (FLEXERIL) 10 MG tablet [Pharmacy Med Name: Cyclobenzaprine HCl 10 MG Oral Tablet] 30 tablet 0    Sig: TAKE 1 TABLET BY MOUTH AT  BEDTIME     Not Delegated - Analgesics:  Muscle Relaxants Failed - 03/10/2022  8:26 AM      Failed - This refill cannot be delegated      Passed - Valid encounter within last 6 months    Recent Outpatient Visits           4 months ago Routine general medical examination at a health care facility   [facility], [place] P, DO   11 months ago Controlled type 2 diabetes mellitus with complication, without long-term current use of insulin ([place])   [place], [place] P, DO   1 year ago Routine general medical examination at a health care facility   [place], [place], DO   1 year ago Primary hypertension   [place], [place] P, DO   2 years ago Routine general medical examination at a health care facility   [facility], [place] P, DO              Signed Prescriptions Disp Refills   levothyroxine (SYNTHROID) 75 MCG tablet 100 tablet 0    Sig: TAKE 1 TABLET BY MOUTH DAILY  BEFORE BREAKFAST     Endocrinology:  Hypothyroid Agents Passed - 03/10/2022  8:26 AM      Passed - TSH in normal range and within 360 days    TSH  Date Value Ref Range Status  10/12/2021 2.570 0.450 - 4.500 uIU/mL Final         Passed - Valid encounter within last 12 months    Recent Outpatient Visits           4 months ago Routine general medical examination at a health care facility   [facility],  Megan P, DO   11 months ago Controlled type 2 diabetes mellitus with complication, without long-term current use of insulin ([place])   [place], [place], DO   1 year ago Routine general medical examination at a health care facility   [place], [place], DO   1 year ago Primary hypertension   [place], [place], DO   2 years ago Routine general medical examination at a health care facility   [place], [place] P, DO               metFORMIN (GLUCOPHAGE) 500 MG tablet 100 tablet 0    Sig: TAKE 1 TABLET BY MOUTH DAILY     Endocrinology:  Diabetes - Biguanides Failed - 03/10/2022  8:26 AM      Failed - B12 Level in normal range and within 720 days    No results found for: "VITAMINB12"       Passed - Cr in normal range and within 360 days    Creatinine, Ser  Date Value Ref Range Status  10/12/2021 1.23 0.76 -  1.27 mg/dL Final         Passed - HBA1C is between 0 and 7.9 and within 180 days    HB A1C (BAYER DCA - WAIVED)  Date Value Ref Range Status  10/12/2021 5.6 4.8 - 5.6 % Final    Comment:             Prediabetes: 5.7 - 6.4          Diabetes: >6.4          Glycemic control for adults with diabetes: <7.0          Passed - eGFR in normal range and within 360 days    GFR calc Af Amer  Date Value Ref Range Status  10/09/2019 64 >59 mL/min/1.73 Final    Comment:    **[facility] currently reports eGFR in compliance with the current**   recommendations of the [employer]. [facility] will   update reporting as new guidelines are published from the NKF-ASN   Task force.    GFR calc non Af Amer  Date Value Ref Range Status  10/09/2019 55 (L) >59 mL/min/1.73 Final   eGFR  Date Value Ref Range Status  10/12/2021 62 >59 mL/min/1.73 Final         Passed - Valid encounter within last 6 months    Recent Outpatient Visits            4 months ago Routine general medical examination at a health care facility   [place], DO   11 months ago Controlled type 2 diabetes mellitus with complication, without long-term current use of insulin ([place])   [place], Megan P, DO   1 year ago Routine general medical examination at a health care facility   [place], [place], DO   1 year ago Primary hypertension   [place], [place], DO   2 years ago Routine general medical examination at a health care facility   [facility], Megan P, DO              Passed - CBC within normal limits and completed in the last 12 months    WBC  Date Value Ref Range Status  10/12/2021 6.8 3.4 - 10.8 x10E3/uL Final   RBC  Date Value Ref Range Status  10/12/2021 5.62 4.14 - 5.80 x10E6/uL Final   Hemoglobin  Date Value Ref Range Status  10/12/2021 16.3 13.0 - 17.7 g/dL Final   Hematocrit  Date Value Ref Range Status  10/12/2021 48.2 37.5 - 51.0 % Final   MCHC  Date Value Ref Range Status  10/12/2021 33.8 31.5 - 35.7 g/dL Final   [facility]  Date Value Ref Range Status  10/12/2021 29.0 26.6 - 33.0 pg Final   MCV  Date Value Ref Range Status  10/12/2021 86 79 - 97 fL Final   No results found for: "PLTCOUNTKUC", "LABPLAT", "POCPLA" RDW  Date Value Ref Range Status  10/12/2021 13.2 11.6 - 15.4 % Final            Requested Prescriptions  Pending Prescriptions Disp Refills   cyclobenzaprine (FLEXERIL) 10 MG tablet [Pharmacy Med Name: Cyclobenzaprine HCl 10 MG Oral Tablet] 30 tablet 0    Sig: TAKE 1 TABLET BY MOUTH AT  BEDTIME     Not Delegated - Analgesics:  Muscle Relaxants Failed - 03/10/2022  8:26 AM  Failed - This refill cannot be delegated      Passed - Valid encounter within last 6 months    Recent Outpatient Visits           4 months ago  Routine general medical examination at a health care facility   [facility], Megan P, DO   11 months ago Controlled type 2 diabetes mellitus with complication, without long-term current use of insulin ([place])   [place], Megan P, DO   1 year ago Routine general medical examination at a health care facility   [place], [place], DO   1 year ago Primary hypertension   [place], [place], DO   2 years ago Routine general medical examination at a health care facility   [facility], [place] P, DO              Signed Prescriptions Disp Refills   levothyroxine (SYNTHROID) 75 MCG tablet 100 tablet 0    Sig: TAKE 1 TABLET BY MOUTH DAILY  BEFORE BREAKFAST     Endocrinology:  Hypothyroid Agents Passed - 03/10/2022  8:26 AM      Passed - TSH in normal range and within 360 days    TSH  Date Value Ref Range Status  10/12/2021 2.570 0.450 - 4.500 uIU/mL Final         Passed - Valid encounter within last 12 months    Recent Outpatient Visits           4 months ago Routine general medical examination at a health care facility   [facility] [place], Megan P, DO   11 months ago Controlled type 2 diabetes mellitus with complication, without long-term current use of insulin ([place])   [place], [place], DO   1 year ago Routine general medical examination at a health care facility   [place], [place], DO   1 year ago Primary hypertension   [place], [place], DO   2 years ago Routine general medical examination at a health care facility   [place], [place] P, DO               metFORMIN (GLUCOPHAGE) 500 MG tablet 100 tablet 0    Sig: TAKE 1 TABLET BY MOUTH DAILY      Endocrinology:  Diabetes - Biguanides Failed - 03/10/2022  8:26 AM      Failed - B12 Level in normal range and within 720 days    No results found for: "VITAMINB12"       Passed - Cr in normal range and within 360 days    Creatinine, Ser  Date Value Ref Range Status  10/12/2021 1.23 0.76 - 1.27 mg/dL Final         Passed - HBA1C is between 0 and 7.9 and within 180 days    HB A1C (BAYER DCA - WAIVED)  Date Value Ref Range Status  10/12/2021 5.6 4.8 - 5.6 % Final    Comment:             Prediabetes: 5.7 - 6.4          Diabetes: >6.4          Glycemic control for adults with diabetes: <7.0  Passed - eGFR in normal range and within 360 days    GFR calc Af Amer  Date Value Ref Range Status  10/09/2019 64 >59 mL/min/1.73 Final    Comment:    **[facility] currently reports eGFR in compliance with the current**   recommendations of the [employer]. [facility] will   update reporting as new guidelines are published from the NKF-ASN   Task force.    GFR calc non Af Amer  Date Value Ref Range Status  10/09/2019 55 (L) >59 mL/min/1.73 Final   eGFR  Date Value Ref Range Status  10/12/2021 62 >59 mL/min/1.73 Final         Passed - Valid encounter within last 6 months    Recent Outpatient Visits           4 months ago Routine general medical examination at a health care facility   [place], DO   11 months ago Controlled type 2 diabetes mellitus with complication, without long-term current use of insulin ([place])   [place], Megan P, DO   1 year ago Routine general medical examination at a health care facility   [place], [place], DO   1 year ago Primary hypertension   [place], [place], DO   2 years ago Routine general medical examination at a health care facility   [facility],  Megan P, DO              Passed - CBC within normal limits and completed in the last 12 months    WBC  Date Value Ref Range Status  10/12/2021 6.8 3.4 - 10.8 x10E3/uL Final   RBC  Date Value Ref Range Status  10/12/2021 5.62 4.14 - 5.80 x10E6/uL Final   Hemoglobin  Date Value Ref Range Status  10/12/2021 16.3 13.0 - 17.7 g/dL Final   Hematocrit  Date Value Ref Range Status  10/12/2021 48.2 37.5 - 51.0 % Final   MCHC  Date Value Ref Range Status  10/12/2021 33.8 31.5 - 35.7 g/dL Final   [facility]  Date Value Ref Range Status  10/12/2021 29.0 26.6 - 33.0 pg Final   MCV  Date Value Ref Range Status  10/12/2021 86 79 - 97 fL Final   No results found for: "PLTCOUNTKUC", "LABPLAT", "POCPLA" RDW  Date Value Ref Range Status  10/12/2021 13.2 11.6 - 15.4 % Final

## 2022-03-10 NOTE — Telephone Encounter (Signed)
Pt wanted make sure his refills request can be placed today since he only has a couple pills left / please advise / pt stated he uses mail order pharmacy and not [facility] so he needs new Levothyroxine refills sent to mail order pharmacy

## 2022-03-10 NOTE — Telephone Encounter (Signed)
Changing to mail order pharmacy - remainder of Rx forwarded. Requested Prescriptions  Pending Prescriptions Disp Refills   cyclobenzaprine (FLEXERIL) 10 MG tablet [Pharmacy Med Name: Cyclobenzaprine HCl 10 MG Oral Tablet] 30 tablet 0    Sig: TAKE 1 TABLET BY MOUTH AT  BEDTIME     Not Delegated - Analgesics:  Muscle Relaxants Failed - 03/10/2022  8:26 AM      Failed - This refill cannot be delegated      Passed - Valid encounter within last 6 months    Recent Outpatient Visits           4 months ago Routine general medical examination at a health care facility   [facility], Megan P, DO   11 months ago Controlled type 2 diabetes mellitus with complication, without long-term current use of insulin ([place])   [place], Megan P, DO   1 year ago Routine general medical examination at a health care facility   [place], [place], DO   1 year ago Primary hypertension   [place], [place] P, DO   2 years ago Routine general medical examination at a health care facility   [facility], [place] P, DO               levothyroxine (SYNTHROID) 75 MCG tablet [Pharmacy Med Name: Levothyroxine Sodium 75 MCG Oral Tablet] 60 tablet 5    Sig: TAKE 1 TABLET BY MOUTH DAILY  BEFORE BREAKFAST     Endocrinology:  Hypothyroid Agents Passed - 03/10/2022  8:26 AM      Passed - TSH in normal range and within 360 days    TSH  Date Value Ref Range Status  10/12/2021 2.570 0.450 - 4.500 uIU/mL Final         Passed - Valid encounter within last 12 months    Recent Outpatient Visits           4 months ago Routine general medical examination at a health care facility   [facility], Megan P, DO   11 months ago Controlled type 2 diabetes mellitus with complication, without long-term current use of insulin ([place])    [place], [place], DO   1 year ago Routine general medical examination at a health care facility   [place], [place], DO   1 year ago Primary hypertension   [place], [place] P, DO   2 years ago Routine general medical examination at a health care facility   [place], [place] P, DO               metFORMIN (GLUCOPHAGE) 500 MG tablet [Pharmacy Med Name: metFORMIN HCl 500 MG Oral Tablet] 100 tablet 2    Sig: TAKE 1 TABLET BY MOUTH DAILY     Endocrinology:  Diabetes - Biguanides Failed - 03/10/2022  8:26 AM      Failed - B12 Level in normal range and within 720 days    No results found for: "VITAMINB12"       Passed - Cr in normal range and within 360 days    Creatinine, Ser  Date Value Ref Range Status  10/12/2021 1.23 0.76 - 1.27 mg/dL Final         Passed - HBA1C is between  0 and 7.9 and within 180 days    HB A1C (BAYER DCA - WAIVED)  Date Value Ref Range Status  10/12/2021 5.6 4.8 - 5.6 % Final    Comment:             Prediabetes: 5.7 - 6.4          Diabetes: >6.4          Glycemic control for adults with diabetes: <7.0          Passed - eGFR in normal range and within 360 days    GFR calc Af Amer  Date Value Ref Range Status  10/09/2019 64 >59 mL/min/1.73 Final    Comment:    **[facility] currently reports eGFR in compliance with the current**   recommendations of the [employer]. [facility] will   update reporting as new guidelines are published from the NKF-ASN   Task force.    GFR calc non Af Amer  Date Value Ref Range Status  10/09/2019 55 (L) >59 mL/min/1.73 Final   eGFR  Date Value Ref Range Status  10/12/2021 62 >59 mL/min/1.73 Final         Passed - Valid encounter within last 6 months    Recent Outpatient Visits           4 months ago Routine general medical examination at a health care facility    [place], DO   11 months ago Controlled type 2 diabetes mellitus with complication, without long-term current use of insulin ([place])   [place], Megan P, DO   1 year ago Routine general medical examination at a health care facility   [place], [place], DO   1 year ago Primary hypertension   [place], [place], DO   2 years ago Routine general medical examination at a health care facility   [facility], Megan P, DO              Passed - CBC within normal limits and completed in the last 12 months    WBC  Date Value Ref Range Status  10/12/2021 6.8 3.4 - 10.8 x10E3/uL Final   RBC  Date Value Ref Range Status  10/12/2021 5.62 4.14 - 5.80 x10E6/uL Final   Hemoglobin  Date Value Ref Range Status  10/12/2021 16.3 13.0 - 17.7 g/dL Final   Hematocrit  Date Value Ref Range Status  10/12/2021 48.2 37.5 - 51.0 % Final   MCHC  Date Value Ref Range Status  10/12/2021 33.8 31.5 - 35.7 g/dL Final   [facility]  Date Value Ref Range Status  10/12/2021 29.0 26.6 - 33.0 pg Final   MCV  Date Value Ref Range Status  10/12/2021 86 79 - 97 fL Final   No results found for: "PLTCOUNTKUC", "LABPLAT", "POCPLA" RDW  Date Value Ref Range Status  10/12/2021 13.2 11.6 - 15.4 % Final

## 2022-03-10 NOTE — Telephone Encounter (Signed)
Notes to clinic: Duplicate request- see below- non delegated rx  Requested Prescriptions  Pending Prescriptions Disp Refills   cyclobenzaprine (FLEXERIL) 10 MG tablet [Pharmacy Med Name: Cyclobenzaprine HCl 10 MG Oral Tablet] 30 tablet 0    Sig: TAKE 1 TABLET BY MOUTH AT  BEDTIME     Not Delegated - Analgesics:  Muscle Relaxants Failed - 03/10/2022  8:26 AM      Failed - This refill cannot be delegated      Passed - Valid encounter within last 6 months    Recent Outpatient Visits           4 months ago Routine general medical examination at a health care facility   [facility], Megan P, DO   11 months ago Controlled type 2 diabetes mellitus with complication, without long-term current use of insulin ([place])   [place], Megan P, DO   1 year ago Routine general medical examination at a health care facility   [place], [place], DO   1 year ago Primary hypertension   [place], [place] P, DO   2 years ago Routine general medical examination at a health care facility   [facility], [place] P, DO              Signed Prescriptions Disp Refills   levothyroxine (SYNTHROID) 75 MCG tablet 100 tablet 0    Sig: TAKE 1 TABLET BY MOUTH DAILY  BEFORE BREAKFAST     Endocrinology:  Hypothyroid Agents Passed - 03/10/2022  8:26 AM      Passed - TSH in normal range and within 360 days    TSH  Date Value Ref Range Status  10/12/2021 2.570 0.450 - 4.500 uIU/mL Final         Passed - Valid encounter within last 12 months    Recent Outpatient Visits           4 months ago Routine general medical examination at a health care facility   [facility], Megan P, DO   11 months ago Controlled type 2 diabetes mellitus with complication, without long-term current use of insulin ([place])   [place], [place], DO   1 year ago Routine general medical examination at a health care facility   [place], [place], DO   1 year ago Primary hypertension   [place], [place], DO   2 years ago Routine general medical examination at a health care facility   [place], [place] P, DO               metFORMIN (GLUCOPHAGE) 500 MG tablet 100 tablet 0    Sig: TAKE 1 TABLET BY MOUTH DAILY     Endocrinology:  Diabetes - Biguanides Failed - 03/10/2022  8:26 AM      Failed - B12 Level in normal range and within 720 days    No results found for: "VITAMINB12"       Passed - Cr in normal range and within 360 days    Creatinine, Ser  Date Value Ref Range Status  10/12/2021 1.23 0.76 - 1.27 mg/dL Final         Passed - HBA1C is between 0 and 7.9 and within 180 days    HB A1C (  BAYER DCA - WAIVED)  Date Value Ref Range Status  10/12/2021 5.6 4.8 - 5.6 % Final    Comment:             Prediabetes: 5.7 - 6.4          Diabetes: >6.4          Glycemic control for adults with diabetes: <7.0          Passed - eGFR in normal range and within 360 days    GFR calc Af Amer  Date Value Ref Range Status  10/09/2019 64 >59 mL/min/1.73 Final    Comment:    **[facility] currently reports eGFR in compliance with the current**   recommendations of the [employer]. [facility] will   update reporting as new guidelines are published from the NKF-ASN   Task force.    GFR calc non Af Amer  Date Value Ref Range Status  10/09/2019 55 (L) >59 mL/min/1.73 Final   eGFR  Date Value Ref Range Status  10/12/2021 62 >59 mL/min/1.73 Final         Passed - Valid encounter within last 6 months    Recent Outpatient Visits           4 months ago Routine general medical examination at a health care facility   [place], DO   11  months ago Controlled type 2 diabetes mellitus with complication, without long-term current use of insulin ([place])   [place], Megan P, DO   1 year ago Routine general medical examination at a health care facility   [place], [place], DO   1 year ago Primary hypertension   [place], [place], DO   2 years ago Routine general medical examination at a health care facility   [facility], Megan P, DO              Passed - CBC within normal limits and completed in the last 12 months    WBC  Date Value Ref Range Status  10/12/2021 6.8 3.4 - 10.8 x10E3/uL Final   RBC  Date Value Ref Range Status  10/12/2021 5.62 4.14 - 5.80 x10E6/uL Final   Hemoglobin  Date Value Ref Range Status  10/12/2021 16.3 13.0 - 17.7 g/dL Final   Hematocrit  Date Value Ref Range Status  10/12/2021 48.2 37.5 - 51.0 % Final   MCHC  Date Value Ref Range Status  10/12/2021 33.8 31.5 - 35.7 g/dL Final   [facility]  Date Value Ref Range Status  10/12/2021 29.0 26.6 - 33.0 pg Final   MCV  Date Value Ref Range Status  10/12/2021 86 79 - 97 fL Final   No results found for: "PLTCOUNTKUC", "LABPLAT", "POCPLA" RDW  Date Value Ref Range Status  10/12/2021 13.2 11.6 - 15.4 % Final            Requested Prescriptions  Pending Prescriptions Disp Refills   cyclobenzaprine (FLEXERIL) 10 MG tablet [Pharmacy Med Name: Cyclobenzaprine HCl 10 MG Oral Tablet] 30 tablet 0    Sig: TAKE 1 TABLET BY MOUTH AT  BEDTIME     Not Delegated - Analgesics:  Muscle Relaxants Failed - 03/10/2022  8:26 AM      Failed - This refill cannot be delegated      Passed - Valid encounter within last 6 months    Recent  Outpatient Visits           4 months ago Routine general medical examination at a health care facility   [place], DO   11 months ago  Controlled type 2 diabetes mellitus with complication, without long-term current use of insulin ([place])   [place], [place] P, DO   1 year ago Routine general medical examination at a health care facility   [place], [place], DO   1 year ago Primary hypertension   [place], [place], DO   2 years ago Routine general medical examination at a health care facility   [facility], [place] P, DO              Signed Prescriptions Disp Refills   levothyroxine (SYNTHROID) 75 MCG tablet 100 tablet 0    Sig: TAKE 1 TABLET BY MOUTH DAILY  BEFORE BREAKFAST     Endocrinology:  Hypothyroid Agents Passed - 03/10/2022  8:26 AM      Passed - TSH in normal range and within 360 days    TSH  Date Value Ref Range Status  10/12/2021 2.570 0.450 - 4.500 uIU/mL Final         Passed - Valid encounter within last 12 months    Recent Outpatient Visits           4 months ago Routine general medical examination at a health care facility   [facility], Megan P, DO   11 months ago Controlled type 2 diabetes mellitus with complication, without long-term current use of insulin ([place])   [place], [place], DO   1 year ago Routine general medical examination at a health care facility   [place], [place], DO   1 year ago Primary hypertension   [place], [place], DO   2 years ago Routine general medical examination at a health care facility   [place], [place] P, DO               metFORMIN (GLUCOPHAGE) 500 MG tablet 100 tablet 0    Sig: TAKE 1 TABLET BY MOUTH DAILY     Endocrinology:  Diabetes - Biguanides Failed - 03/10/2022  8:26 AM      Failed - B12 Level in normal range and within 720 days    No  results found for: "VITAMINB12"       Passed - Cr in normal range and within 360 days    Creatinine, Ser  Date Value Ref Range Status  10/12/2021 1.23 0.76 - 1.27 mg/dL Final         Passed - HBA1C is between 0 and 7.9 and within 180 days    HB A1C (BAYER DCA - WAIVED)  Date Value Ref Range Status  10/12/2021 5.6 4.8 - 5.6 % Final    Comment:             Prediabetes: 5.7 - 6.4          Diabetes: >6.4          Glycemic control for adults with diabetes: <7.0          Passed - eGFR in normal range and within 360 days    GFR calc Af [place]  Date Value  Ref Range Status  10/09/2019 64 >59 mL/min/1.73 Final    Comment:    **[facility] currently reports eGFR in compliance with the current**   recommendations of the [employer]. [facility] will   update reporting as new guidelines are published from the NKF-ASN   Task force.    GFR calc non Af Amer  Date Value Ref Range Status  10/09/2019 55 (L) >59 mL/min/1.73 Final   eGFR  Date Value Ref Range Status  10/12/2021 62 >59 mL/min/1.73 Final         Passed - Valid encounter within last 6 months    Recent Outpatient Visits           4 months ago Routine general medical examination at a health care facility   [place], DO   11 months ago Controlled type 2 diabetes mellitus with complication, without long-term current use of insulin ([place])   [place], Megan P, DO   1 year ago Routine general medical examination at a health care facility   [place], [place], DO   1 year ago Primary hypertension   [place], [place], DO   2 years ago Routine general medical examination at a health care facility   [facility], Megan P, DO              Passed - CBC within normal limits and completed in the last 12 months    WBC  Date Value Ref Range  Status  10/12/2021 6.8 3.4 - 10.8 x10E3/uL Final   RBC  Date Value Ref Range Status  10/12/2021 5.62 4.14 - 5.80 x10E6/uL Final   Hemoglobin  Date Value Ref Range Status  10/12/2021 16.3 13.0 - 17.7 g/dL Final   Hematocrit  Date Value Ref Range Status  10/12/2021 48.2 37.5 - 51.0 % Final   MCHC  Date Value Ref Range Status  10/12/2021 33.8 31.5 - 35.7 g/dL Final   [facility]  Date Value Ref Range Status  10/12/2021 29.0 26.6 - 33.0 pg Final   MCV  Date Value Ref Range Status  10/12/2021 86 79 - 97 fL Final   No results found for: "PLTCOUNTKUC", "LABPLAT", "POCPLA" RDW  Date Value Ref Range Status  10/12/2021 13.2 11.6 - 15.4 % Final

## 2022-04-13 DIAGNOSIS — J019 Acute sinusitis, unspecified: Secondary | ICD-10-CM | POA: Diagnosis not present

## 2022-04-13 DIAGNOSIS — L97519 Non-pressure chronic ulcer of other part of right foot with unspecified severity: Secondary | ICD-10-CM | POA: Diagnosis not present

## 2022-04-13 DIAGNOSIS — M7021 Olecranon bursitis, right elbow: Secondary | ICD-10-CM | POA: Diagnosis not present

## 2022-04-21 ENCOUNTER — Ambulatory Visit (INDEPENDENT_AMBULATORY_CARE_PROVIDER_SITE_OTHER): Payer: Medicare Other | Admitting: Family Medicine

## 2022-04-21 ENCOUNTER — Encounter: Payer: Self-pay | Admitting: Family Medicine

## 2022-04-21 ENCOUNTER — Telehealth: Payer: Self-pay

## 2022-04-21 VITALS — BP 137/78 | HR 80 | Temp 97.7°F | Ht 74.0 in | Wt 230.6 lb

## 2022-04-21 DIAGNOSIS — Z7984 Long term (current) use of oral hypoglycemic drugs: Secondary | ICD-10-CM | POA: Diagnosis not present

## 2022-04-21 DIAGNOSIS — E782 Mixed hyperlipidemia: Secondary | ICD-10-CM

## 2022-04-21 DIAGNOSIS — E118 Type 2 diabetes mellitus with unspecified complications: Secondary | ICD-10-CM

## 2022-04-21 DIAGNOSIS — E785 Hyperlipidemia, unspecified: Secondary | ICD-10-CM

## 2022-04-21 DIAGNOSIS — Z0000 Encounter for general adult medical examination without abnormal findings: Secondary | ICD-10-CM | POA: Diagnosis not present

## 2022-04-21 DIAGNOSIS — E039 Hypothyroidism, unspecified: Secondary | ICD-10-CM

## 2022-04-21 DIAGNOSIS — R195 Other fecal abnormalities: Secondary | ICD-10-CM | POA: Diagnosis not present

## 2022-04-21 DIAGNOSIS — E790 Hyperuricemia without signs of inflammatory arthritis and tophaceous disease: Secondary | ICD-10-CM | POA: Diagnosis not present

## 2022-04-21 DIAGNOSIS — N1831 Chronic kidney disease, stage 3a: Secondary | ICD-10-CM

## 2022-04-21 DIAGNOSIS — I129 Hypertensive chronic kidney disease with stage 1 through stage 4 chronic kidney disease, or unspecified chronic kidney disease: Secondary | ICD-10-CM | POA: Diagnosis not present

## 2022-04-21 DIAGNOSIS — I25118 Atherosclerotic heart disease of native coronary artery with other forms of angina pectoris: Secondary | ICD-10-CM | POA: Insufficient documentation

## 2022-04-21 DIAGNOSIS — I10 Essential (primary) hypertension: Secondary | ICD-10-CM | POA: Diagnosis not present

## 2022-04-21 DIAGNOSIS — D692 Other nonthrombocytopenic purpura: Secondary | ICD-10-CM

## 2022-04-21 LAB — BAYER DCA HB A1C WAIVED: HB A1C (BAYER DCA - WAIVED): 5.8 % — ABNORMAL HIGH (ref 4.8–5.6)

## 2022-04-21 MED ORDER — OMEPRAZOLE 40 MG PO CPDR: 40.0000 mg | DELAYED_RELEASE_CAPSULE | Freq: Every day | ORAL | 3 refills | Status: DC

## 2022-04-21 MED ORDER — MELOXICAM 15 MG PO TABS: ORAL_TABLET | ORAL | 3 refills | Status: DC

## 2022-04-21 MED ORDER — METFORMIN HCL 500 MG PO TABS: 500.0000 mg | ORAL_TABLET | Freq: Every day | ORAL | 0 refills | Status: DC

## 2022-04-21 MED ORDER — ROSUVASTATIN CALCIUM 5 MG PO TABS: 5.0000 mg | ORAL_TABLET | Freq: Every day | ORAL | 2 refills | Status: DC

## 2022-04-21 MED ORDER — LISINOPRIL 10 MG PO TABS: 10.0000 mg | ORAL_TABLET | Freq: Every day | ORAL | 1 refills | Status: DC

## 2022-04-21 NOTE — Assessment & Plan Note (Signed)
Rechecking labs today. Await results. Treat as needed.  °

## 2022-04-21 NOTE — Assessment & Plan Note (Addendum)
Doing well with A1c of 5.8. Continue current regimen. Continue to monitor. Needs diabetic shoes. Will order.

## 2022-04-21 NOTE — Assessment & Plan Note (Signed)
Checking labs today. Await results.  

## 2022-04-21 NOTE — Assessment & Plan Note (Signed)
Reassured patient. Continue to monitor.  

## 2022-04-21 NOTE — Progress Notes (Signed)
BP 137/78   Pulse 80   Temp 97.7 F (36.5 C) (Oral)   Ht 6\' 2"  (1.88 m)   Wt 230 lb 9.6 oz (104.6 kg)   SpO2 97%   BMI 29.61 kg/m    Subjective:    Patient ID: Kenneth Johnston, male    DOB: 08-14-1950, 72 y.o.   MRN: [id number]  HPI: Kenneth Johnston is a 72 y.o. male presenting on 04/21/2022 for Annual Wellness examination. Current medical complaints include:  DIABETES Hypoglycemic episodes:no Polydipsia/polyuria: no Visual disturbance: no Chest pain: no Paresthesias: no Glucose Monitoring: no  Accucheck frequency: rarely Taking Insulin?: no Blood Pressure Monitoring: not checking Retinal Examination: Up to Date Foot Exam: Up to Date Diabetic Education: Completed Pneumovax: declined Influenza: declined Aspirin: yes  HYPERTENSION / HYPERLIPIDEMIA Satisfied with current treatment? yes Duration of hypertension: chronic BP monitoring frequency: not checking BP medication side effects: no Past BP meds: lisinopril Duration of hyperlipidemia: chronic Cholesterol medication side effects: no Cholesterol supplements: none Past cholesterol medications: crestor Medication compliance: excellent compliance Aspirin: yes Recent stressors: no Recurrent headaches: no Visual changes: no Palpitations: no Dyspnea: no Chest pain: no Lower extremity edema: no Dizzy/lightheaded: no  HYPOTHYROIDISM Thyroid control status:stable Satisfied with current treatment? yes Medication side effects: no Medication compliance: excellent compliance Recent dose adjustment:no Fatigue: no Cold intolerance: yes Heat intolerance: no Weight gain: no Weight loss: no Constipation: yes Diarrhea/loose stools: no Palpitations: no Lower extremity edema: no Anxiety/depressed mood: no  He currently lives with: Interim Problems from his last visit: no  Functional Status Survey: Is the patient deaf or have difficulty hearing?: No Does the patient have difficulty seeing, even when wearing  glasses/contacts?: No Does the patient have difficulty concentrating, remembering, or making decisions?: No Does the patient have difficulty walking or climbing stairs?: No Does the patient have difficulty dressing or bathing?: No Does the patient have difficulty doing errands alone such as visiting a doctor's office or shopping?: No  FALL RISK:    04/21/2022   10:24 AM 10/12/2021    9:38 AM 04/11/2021   10:31 AM 10/15/2020    1:02 PM 10/13/2019    2:32 PM  Fall Risk   Falls in the past year? 0 0 0 0 0  Number falls in past yr: 0 0 0    Injury with Fall? 0 0 0    Risk for fall due to : No Fall Risks No Fall Risks No Fall Risks Medication side effect Medication side effect  Follow up Falls evaluation completed Falls evaluation completed Falls evaluation completed Falls evaluation completed;Education provided;Falls prevention discussed Falls evaluation completed;Education provided;Falls prevention discussed    Depression Screen    04/21/2022   10:24 AM 10/12/2021    9:39 AM 04/11/2021   10:32 AM 10/15/2020    1:02 PM 10/13/2019    2:32 PM  Depression screen PHQ 2/9  Decreased Interest 0 0 0 0 0  Down, Depressed, Hopeless 0 0 0 0 0  PHQ - 2 Score 0 0 0 0 0  Altered sleeping 0 0 0    Tired, decreased energy 0 3 0    Change in appetite 0 0 0    Feeling bad or failure about yourself  0 0 0    Trouble concentrating 0 0 0    Moving slowly or fidgety/restless 0 0 0    Suicidal thoughts 0 0 0    PHQ-9 Score 0 3 0    Difficult doing work/chores Not difficult at  all Not difficult at all       Advanced Directives Does patient have a HCPOA?    no If yes, name and contact information:  Does patient have a living will or MOST form?  no  Past Medical History:  Past Medical History:  Diagnosis Date   Allergy    Arthritis    CKD (chronic kidney disease), stage III    Coronary artery calcification    Hyperlipidemia    Hypertension    Hypothyroidism    Umbilical hernia 03/04/2015     Surgical History:  Past Surgical History:  Procedure Laterality Date   KNEE CARTILAGE SURGERY Bilateral     Medications:  Current Outpatient Medications on File Prior to Visit  Medication Sig   aspirin EC 81 MG tablet Take 1 tablet (81 mg total) by mouth daily.   cyclobenzaprine (FLEXERIL) 10 MG tablet TAKE 1 TABLET BY MOUTH AT  BEDTIME   levothyroxine (SYNTHROID) 75 MCG tablet TAKE 1 TABLET BY MOUTH DAILY  BEFORE BREAKFAST   No current facility-administered medications on file prior to visit.    Allergies:  No Known Allergies  Social History:  Social History   Socioeconomic History   Marital status: Married    Spouse name: Not on file   Number of children: Not on file   Years of education: Not on file   Highest education level: 12th grade  Occupational History   Not on file  Tobacco Use   Smoking status: Former    Types: Cigarettes    Quit date: 03/04/1995    Years since quitting: 27.1   Smokeless tobacco: Never  Vaping Use   Vaping Use: Never used  Substance and Sexual Activity   Alcohol use: Yes    Comment: very seldom    Drug use: No   Sexual activity: Not Currently  Other Topics Concern   Not on file  Social History Narrative   [place]    Social Determinants of Health   Financial Resource Strain: Low Risk  (04/19/2022)   Overall Financial Resource Strain (CARDIA)    Difficulty of Paying Living Expenses: Not hard at all  Food Insecurity: No Food Insecurity (04/19/2022)   Hunger Vital Sign    Worried About Running Out of Food in the Last Year: Never true    Ran Out of Food in the Last Year: Never true  Transportation Needs: No Transportation Needs (04/19/2022)   PRAPARE - Administrator, Civil Service (Medical): No    Lack of Transportation (Non-Medical): No  Physical Activity: Sufficiently Active (04/19/2022)   Exercise Vital Sign    Days of Exercise per Week: 3 days    Minutes of Exercise per Session: 60 min  Stress: No Stress Concern  Present (04/19/2022)   [employer] of Occupational Health - Occupational Stress Questionnaire    Feeling of Stress : Not at all  Social Connections: Socially Integrated (04/19/2022)   Social Connection and Isolation Panel [NHANES]    Frequency of Communication with Friends and Family: More than three times a week    Frequency of Social Gatherings with Friends and Family: Three times a week    Attends Religious Services: More than 4 times per year    Active Member of Clubs or Organizations: Yes    Attends Banker Meetings: More than 4 times per year    Marital Status: Married  Catering manager Violence: Not At Risk (05/17/2017)   Humiliation, Afraid, Rape, and Kick questionnaire  Fear of Current or Ex-Partner: No    Emotionally Abused: No    Physically Abused: No    Sexually Abused: No   Social History   Tobacco Use  Smoking Status Former   Types: Cigarettes   Quit date: 03/04/1995   Years since quitting: 27.1  Smokeless Tobacco Never   Social History   Substance and Sexual Activity  Alcohol Use Yes   Comment: very seldom     Family History:  Family History  Problem Relation Age of Onset   Kidney failure Mother    Aneurysm Father        brain   Kidney disease Paternal Grandmother    Heart attack Paternal Grandfather     Past medical history, surgical history, medications, allergies, family history and social history reviewed with patient today and changes made to appropriate areas of the chart.   Review of Systems  Constitutional: Negative.   HENT: Negative.    Respiratory: Negative.    Cardiovascular: Negative.   Musculoskeletal: Negative.   Skin: Negative.   Neurological: Negative.   Psychiatric/Behavioral: Negative.     All other ROS negative except what is listed above and in the HPI.      Objective:    BP 137/78   Pulse 80   Temp 97.7 F (36.5 C) (Oral)   Ht 6\' 2"  (1.88 m)   Wt 230 lb 9.6 oz (104.6 kg)   SpO2 97%   BMI 29.61 kg/m    Wt Readings from Last 3 Encounters:  04/21/22 230 lb 9.6 oz (104.6 kg)  12/13/21 232 lb (105.2 kg)  10/12/21 224 lb 11.2 oz (101.9 kg)     Physical Exam Vitals and nursing note reviewed.  Constitutional:      General: He is not in acute distress.    Appearance: Normal appearance. He is normal weight. He is not ill-appearing, toxic-appearing or diaphoretic.  HENT:     Head: Normocephalic and atraumatic.     Right Ear: External ear normal.     Left Ear: External ear normal.     Nose: Nose normal.     Mouth/Throat:     Mouth: Mucous membranes are moist.     Pharynx: Oropharynx is clear.  Eyes:     General: No scleral icterus.       Right eye: No discharge.        Left eye: No discharge.     Extraocular Movements: Extraocular movements intact.     Conjunctiva/sclera: Conjunctivae normal.     Pupils: Pupils are equal, round, and reactive to light.  Cardiovascular:     Rate and Rhythm: Normal rate and regular rhythm.     Pulses: Normal pulses.     Heart sounds: Normal heart sounds. No murmur heard.    No friction rub. No gallop.  Pulmonary:     Effort: Pulmonary effort is normal. No respiratory distress.     Breath sounds: Normal breath sounds. No stridor. No wheezing, rhonchi or rales.  Chest:     Chest wall: No tenderness.  Musculoskeletal:        General: Normal range of motion.     Cervical back: Normal range of motion and neck supple.  Skin:    General: Skin is warm and dry.     Capillary Refill: Capillary refill takes less than 2 seconds.     Coloration: Skin is not jaundiced or pale.     Findings: No bruising, erythema, lesion or rash.  Neurological:  General: No focal deficit present.     Mental Status: He is alert and oriented to person, place, and time. Mental status is at baseline.  Psychiatric:        Mood and Affect: Mood normal.        Behavior: Behavior normal.        Thought Content: Thought content normal.        Judgment: Judgment normal.         04/21/2022   10:47 AM 10/15/2020    1:03 PM 10/13/2019    2:33 PM 10/04/2018   10:18 AM 05/17/2017    1:15 PM  6CIT Screen  What Year? 0 points 0 points 0 points 0 points 0 points  What month? 0 points 0 points 0 points 0 points 0 points  What time? 0 points 0 points 0 points 0 points 0 points  Count back from 20 0 points 0 points 0 points 0 points 0 points  Months in reverse 0 points 0 points 0 points 0 points 0 points  Repeat phrase 0 points 2 points 0 points 2 points 0 points  Total Score 0 points 2 points 0 points 2 points 0 points    Results for orders placed or performed in visit on 04/21/22  Bayer DCA Hb A1c Waived  Result Value Ref Range   HB A1C (BAYER DCA - WAIVED) 5.8 (H) 4.8 - 5.6 %      Assessment & Plan:   Problem List Items Addressed This Visit       Cardiovascular and Mediastinum   HTN (hypertension)    Under good control on current regimen. Continue current regimen. Continue to monitor. Call with any concerns. Refills given. Labs drawn today.        Relevant Medications   lisinopril (ZESTRIL) 10 MG tablet   rosuvastatin (CRESTOR) 5 MG tablet   Other Relevant Orders   CBC with Differential/Platelet   Comprehensive metabolic panel   Senile purpura    Reassured patient. Continue to monitor.       Relevant Medications   lisinopril (ZESTRIL) 10 MG tablet   rosuvastatin (CRESTOR) 5 MG tablet   Other Relevant Orders   CBC with Differential/Platelet   Comprehensive metabolic panel   Coronary artery disease of native artery of native heart with stable angina pectoris    Stable. Continue to follow with cardiology. Will keep BP and cholesterol under good control. Continue to monitor.       Relevant Medications   lisinopril (ZESTRIL) 10 MG tablet   meloxicam (MOBIC) 15 MG tablet   rosuvastatin (CRESTOR) 5 MG tablet     Endocrine   Hypothyroidism    Rechecking labs today. Await results. Treat as needed.       Relevant Orders   CBC with  Differential/Platelet   Comprehensive metabolic panel   TSH   Controlled type 2 diabetes mellitus with complication, without long-term current use of insulin    Doing well with A1c of 5.8. Continue current regimen. Continue to monitor.       Relevant Medications   lisinopril (ZESTRIL) 10 MG tablet   metFORMIN (GLUCOPHAGE) 500 MG tablet   rosuvastatin (CRESTOR) 5 MG tablet   Other Relevant Orders   Bayer DCA Hb A1c Waived (Completed)   CBC with Differential/Platelet   Comprehensive metabolic panel     Genitourinary   Chronic kidney disease, stage 3a    Checking labs today. Await results.         Other  Hyperlipidemia    Under good control on current regimen. Continue current regimen. Continue to monitor. Call with any concerns. Refills given. Labs drawn today.        Relevant Medications   lisinopril (ZESTRIL) 10 MG tablet   rosuvastatin (CRESTOR) 5 MG tablet   Other Relevant Orders   CBC with Differential/Platelet   Comprehensive metabolic panel   Lipid Panel w/o Chol/HDL Ratio   Elevated uric acid in blood    Checking labs today. Await results.       Relevant Orders   CBC with Differential/Platelet   Comprehensive metabolic panel   Uric acid   Other Visit Diagnoses     Encounter for Medicare annual wellness exam    -  Primary   Preventative care discussed today as below.   Positive colorectal cancer screening using Cologuard test       Has not gone for his colonoscopy- referral resent today.   Relevant Orders   Ambulatory referral to Gastroenterology        Preventative Services:  Health Risk Assessment and Personalized Prevention Plan: Done today Bone Mass Measurements: N/A CVD Screening: Done today Colon Cancer Screening: Needs Colonoscopy Depression Screening: Done today Diabetes Screening: Done today Glaucoma Screening: See your eye doctor Hepatitis B vaccine: N/A Hepatitis C screening: Up to date HIV Screening: Up to date Flu Vaccine: Postpone  to flu season Lung cancer Screening: N/A Obesity Screening: Done today Pneumonia Vaccines (2): Declined STI Screening: N/A PSA screening: Up to date.   Discussed aspirin prophylaxis for myocardial infarction prevention and decision was made to continue ASA  LABORATORY TESTING:  Health maintenance labs ordered today as discussed above.   IMMUNIZATIONS:   - Tdap: Tetanus vaccination status reviewed: last tetanus booster within 10 years. - Influenza: Postponed to flu season - Pneumovax: Refused - Prevnar: Refused - Zostavax vaccine: Refused  SCREENING: - Colonoscopy: Ordered today  Discussed with patient purpose of the colonoscopy is to detect colon cancer at curable precancerous or early stages    PATIENT COUNSELING:    Sexuality: Discussed sexually transmitted diseases, partner selection, use of condoms, avoidance of unintended pregnancy  and contraceptive alternatives.   Advised to avoid cigarette smoking.  I discussed with the patient that most people either abstain from alcohol or drink within safe limits (<=14/week and <=4 drinks/occasion for males, <=7/weeks and <= 3 drinks/occasion for females) and that the risk for alcohol disorders and other health effects rises proportionally with the number of drinks per week and how often a drinker exceeds daily limits.  Discussed cessation/primary prevention of drug use and availability of treatment for abuse.   Diet: Encouraged to adjust caloric intake to maintain  or achieve ideal body weight, to reduce intake of dietary saturated fat and total fat, to limit sodium intake by avoiding high sodium foods and not adding table salt, and to maintain adequate dietary potassium and calcium preferably from fresh fruits, vegetables, and low-fat dairy products.    stressed the importance of regular exercise  Injury prevention: Discussed safety belts, safety helmets, smoke detector, smoking near bedding or upholstery.   Dental health:  Discussed importance of regular tooth brushing, flossing, and dental visits.   Follow up plan: NEXT PREVENTATIVE PHYSICAL DUE IN 1 YEAR. Return in about 6 months (around 10/21/2022) for physical.

## 2022-04-21 NOTE — Progress Notes (Signed)
BP 137/78   Pulse 80   Temp 97.7 F (36.5 C) (Oral)   Ht 6\' 2"  (1.88 m)   Wt 230 lb 9.6 oz (104.6 kg)   SpO2 97%   BMI 29.61 kg/m    Subjective:    Patient ID: Kenneth Regalhomas A Rudnick, male    DOB: 14-Aug-1950, 72 y.o.   MRN: [id number]  HPI: Kenneth Johnston is a 72 y.o. male  Chief Complaint  Patient presents with   Diabetes   Hypertension   Hyperlipidemia   Cough    Patient says he has had a sinus infection for the past 3-4 weeks. Patient says he was seen about two weeks ago and was prescribed antibiotics and said it helped, but he took the last dose this morning and says he has symptoms.      Relevant past medical, surgical, family and social history reviewed and updated as indicated. Interim medical history since our last visit reviewed. Allergies and medications reviewed and updated.  Review of Systems  Constitutional:  Positive for chills and fatigue. Negative for activity change, appetite change, diaphoresis, fever and unexpected weight change.  HENT:  Positive for sneezing. Negative for congestion, dental problem, drooling, ear discharge, ear pain, facial swelling, hearing loss, mouth sores, nosebleeds, postnasal drip, rhinorrhea, sinus pressure, sinus pain, sore throat, tinnitus, trouble swallowing and voice change.   Respiratory: Negative.    Cardiovascular: Negative.   Gastrointestinal: Negative.   Musculoskeletal: Negative.   Psychiatric/Behavioral: Negative.      Per HPI unless specifically indicated above     Objective:    BP 137/78   Pulse 80   Temp 97.7 F (36.5 C) (Oral)   Ht 6\' 2"  (1.88 m)   Wt 230 lb 9.6 oz (104.6 kg)   SpO2 97%   BMI 29.61 kg/m   Wt Readings from Last 3 Encounters:  04/21/22 230 lb 9.6 oz (104.6 kg)  12/13/21 232 lb (105.2 kg)  10/12/21 224 lb 11.2 oz (101.9 kg)    Physical Exam  Results for orders placed or performed in visit on 10/12/21  Microscopic Examination   Urine  Result Value Ref Range   WBC, UA 11-30 (A) 0 - 5  /hpf   RBC, Urine 0-2 0 - 2 /hpf   Epithelial Cells (non renal) 0-10 0 - 10 /hpf   Bacteria, UA Moderate (A) None seen/Few  Bayer DCA Hb A1c Waived  Result Value Ref Range   HB A1C (BAYER DCA - WAIVED) 5.6 4.8 - 5.6 %  Comprehensive metabolic panel  Result Value Ref Range   Glucose 105 (H) 70 - 99 mg/dL   BUN 14 8 - 27 mg/dL   Creatinine, Ser 4.091.23 0.76 - 1.27 mg/dL   eGFR 62 >81>59 XB/JYN/8.29mL/min/1.73   BUN/Creatinine Ratio 11 10 - 24   Sodium 142 134 - 144 mmol/L   Potassium 4.0 3.5 - 5.2 mmol/L   Chloride 106 96 - 106 mmol/L   CO2 24 20 - 29 mmol/L   Calcium 9.6 8.6 - 10.2 mg/dL   Total Protein 6.7 6.0 - 8.5 g/dL   Albumin 4.3 3.8 - 4.8 g/dL   Globulin, Total 2.4 1.5 - 4.5 g/dL   Albumin/Globulin Ratio 1.8 1.2 - 2.2   Bilirubin Total 0.5 0.0 - 1.2 mg/dL   Alkaline Phosphatase 57 44 - 121 IU/L   AST 21 0 - 40 IU/L   ALT 23 0 - 44 IU/L  CBC with Differential/Platelet  Result Value Ref Range  WBC 6.8 3.4 - 10.8 x10E3/uL   RBC 5.62 4.14 - 5.80 x10E6/uL   Hemoglobin 16.3 13.0 - 17.7 g/dL   Hematocrit 04.8 88.9 - 51.0 %   MCV 86 79 - 97 fL   MCH 29.0 26.6 - 33.0 pg   MCHC 33.8 31.5 - 35.7 g/dL   RDW 16.9 45.0 - 38.8 %   Platelets 196 150 - 450 x10E3/uL   Neutrophils 70 Not Estab. %   Lymphs 19 Not Estab. %   Monocytes 8 Not Estab. %   Eos 2 Not Estab. %   Basos 1 Not Estab. %   Neutrophils Absolute 4.8 1.4 - 7.0 x10E3/uL   Lymphocytes Absolute 1.3 0.7 - 3.1 x10E3/uL   Monocytes Absolute 0.5 0.1 - 0.9 x10E3/uL   EOS (ABSOLUTE) 0.1 0.0 - 0.4 x10E3/uL   Basophils Absolute 0.0 0.0 - 0.2 x10E3/uL   Immature Granulocytes 0 Not Estab. %   Immature Grans (Abs) 0.0 0.0 - 0.1 x10E3/uL  Lipid Panel w/o Chol/HDL Ratio  Result Value Ref Range   Cholesterol, Total 149 100 - 199 mg/dL   Triglycerides 828 (H) 0 - 149 mg/dL   HDL 35 (L) >00 mg/dL   VLDL Cholesterol Cal 31 5 - 40 mg/dL   LDL Chol Calc (NIH) 83 0 - 99 mg/dL  PSA  Result Value Ref Range   Prostate Specific Ag, Serum 3.5 0.0  - 4.0 ng/mL  TSH  Result Value Ref Range   TSH 2.570 0.450 - 4.500 uIU/mL  Urinalysis, Routine w reflex microscopic  Result Value Ref Range   Specific Gravity, UA 1.010 1.005 - 1.030   pH, UA 6.0 5.0 - 7.5   Color, UA Yellow Yellow   Appearance Ur Cloudy (A) Clear   Leukocytes,UA 3+ (A) Negative   Protein,UA Negative Negative/Trace   Glucose, UA Negative Negative   Ketones, UA Negative Negative   RBC, UA Trace (A) Negative   Bilirubin, UA Negative Negative   Urobilinogen, Ur 0.2 0.2 - 1.0 mg/dL   Nitrite, UA Negative Negative   Microscopic Examination See below:   Microalbumin, Urine Waived  Result Value Ref Range   Microalb, Ur Waived 30 (H) 0 - 19 mg/L   Creatinine, Urine Waived 50 10 - 300 mg/dL   Microalb/Creat Ratio 30-300 (H) <30 mg/g      Assessment & Plan:   Problem List Items Addressed This Visit       Cardiovascular and Mediastinum   HTN (hypertension)   Relevant Orders   CBC with Differential/Platelet   Comprehensive metabolic panel   Senile purpura   Relevant Orders   CBC with Differential/Platelet   Comprehensive metabolic panel     Endocrine   Hypothyroidism - Primary   Relevant Orders   CBC with Differential/Platelet   Comprehensive metabolic panel   TSH   Controlled type 2 diabetes mellitus with complication, without long-term current use of insulin   Relevant Orders   Bayer DCA Hb A1c Waived   CBC with Differential/Platelet   Comprehensive metabolic panel     Other   Hyperlipidemia   Relevant Orders   CBC with Differential/Platelet   Comprehensive metabolic panel   Lipid Panel w/o Chol/HDL Ratio   Elevated uric acid in blood   Relevant Orders   CBC with Differential/Platelet   Comprehensive metabolic panel   Uric acid     Follow up plan: No follow-ups on file.

## 2022-04-21 NOTE — Telephone Encounter (Signed)
Per Dr Laural Benes informed me to reach out to the patient to make him aware that his Cologuard test was positive. Dr Laural Benes wanted patient to know that she is placing a new referral for him to have a Colonoscopy since results from his Cologuard were positive.   OK for PEC to give note if patient calls back.

## 2022-04-21 NOTE — Assessment & Plan Note (Signed)
Under good control on current regimen. Continue current regimen. Continue to monitor. Call with any concerns. Refills given. Labs drawn today.   

## 2022-04-21 NOTE — Assessment & Plan Note (Signed)
Stable. Continue to follow with cardiology. Will keep BP and cholesterol under good control. Continue to monitor.

## 2022-04-21 NOTE — Patient Instructions (Addendum)
Preventative Services:  Health Risk Assessment and Personalized Prevention Plan: Done today Bone Mass Measurements: N/A CVD Screening: Done today Colon Cancer Screening: Needs Colonoscopy Depression Screening: Done today Diabetes Screening: Done today Glaucoma Screening: See your eye doctor Hepatitis B vaccine: N/A Hepatitis C screening: Up to date HIV Screening: Up to date Flu Vaccine: Postpone to flu season Lung cancer Screening: N/A Obesity Screening: Done today Pneumonia Vaccines (2): Declined STI Screening: N/A PSA screening: Up to date.

## 2022-04-22 LAB — COMPREHENSIVE METABOLIC PANEL
ALT: 20 IU/L (ref 0–44)
AST: 20 IU/L (ref 0–40)
Albumin/Globulin Ratio: 1.8 (ref 1.2–2.2)
Albumin: 4.4 g/dL (ref 3.8–4.8)
Alkaline Phosphatase: 70 IU/L (ref 44–121)
BUN/Creatinine Ratio: 14 (ref 10–24)
BUN: 21 mg/dL (ref 8–27)
Bilirubin Total: 0.3 mg/dL (ref 0.0–1.2)
CO2: 20 mmol/L (ref 20–29)
Calcium: 9.5 mg/dL (ref 8.6–10.2)
Chloride: 106 mmol/L (ref 96–106)
Creatinine, Ser: 1.45 mg/dL — ABNORMAL HIGH (ref 0.76–1.27)
Globulin, Total: 2.4 g/dL (ref 1.5–4.5)
Glucose: 138 mg/dL — ABNORMAL HIGH (ref 70–99)
Potassium: 4.3 mmol/L (ref 3.5–5.2)
Sodium: 141 mmol/L (ref 134–144)
Total Protein: 6.8 g/dL (ref 6.0–8.5)
eGFR: 51 mL/min/{1.73_m2} — ABNORMAL LOW (ref >59)

## 2022-04-22 LAB — CBC WITH DIFFERENTIAL/PLATELET
Basophils Absolute: 0.1 10*3/uL (ref 0.0–0.2)
Basos: 1 %
EOS (ABSOLUTE): 0.1 10*3/uL (ref 0.0–0.4)
Eos: 1 %
Hematocrit: 46.9 % (ref 37.5–51.0)
Hemoglobin: 15.9 g/dL (ref 13.0–17.7)
Immature Grans (Abs): 0 10*3/uL (ref 0.0–0.1)
Immature Granulocytes: 0 %
Lymphocytes Absolute: 1.3 10*3/uL (ref 0.7–3.1)
Lymphs: 13 %
MCH: 29.2 pg (ref 26.6–33.0)
MCHC: 33.9 g/dL (ref 31.5–35.7)
MCV: 86 fL (ref 79–97)
Monocytes Absolute: 0.5 10*3/uL (ref 0.1–0.9)
Monocytes: 5 %
Neutrophils Absolute: 8 10*3/uL — ABNORMAL HIGH (ref 1.4–7.0)
Neutrophils: 80 %
Platelets: 303 10*3/uL (ref 150–450)
RBC: 5.44 x10E6/uL (ref 4.14–5.80)
RDW: 14 % (ref 11.6–15.4)
WBC: 10 10*3/uL (ref 3.4–10.8)

## 2022-04-22 LAB — LIPID PANEL W/O CHOL/HDL RATIO
Cholesterol, Total: 160 mg/dL (ref 100–199)
HDL: 34 mg/dL — ABNORMAL LOW (ref >39)
LDL Chol Calc (NIH): 101 mg/dL — ABNORMAL HIGH (ref 0–99)
Triglycerides: 141 mg/dL (ref 0–149)
VLDL Cholesterol Cal: 25 mg/dL (ref 5–40)

## 2022-04-22 LAB — TSH: TSH: 2.36 u[IU]/mL (ref 0.450–4.500)

## 2022-04-22 LAB — URIC ACID: Uric Acid: 9 mg/dL — ABNORMAL HIGH (ref 3.8–8.4)

## 2022-04-25 ENCOUNTER — Other Ambulatory Visit: Payer: Self-pay | Admitting: Family Medicine

## 2022-04-25 MED ORDER — LEVOTHYROXINE SODIUM 75 MCG PO TABS: ORAL_TABLET | ORAL | 4 refills | Status: DC

## 2022-04-27 ENCOUNTER — Encounter: Payer: Self-pay | Admitting: Family Medicine

## 2022-04-27 DIAGNOSIS — Z1211 Encounter for screening for malignant neoplasm of colon: Secondary | ICD-10-CM

## 2022-04-28 ENCOUNTER — Telehealth: Payer: Self-pay

## 2022-04-28 MED ORDER — COLCHICINE 0.6 MG PO TABS
0.6000 mg | ORAL_TABLET | Freq: Every day | ORAL | 0 refills | Status: DC
Start: 2022-04-28 — End: 2022-10-23

## 2022-04-28 MED ORDER — ALLOPURINOL 100 MG PO TABS: 100.0000 mg | ORAL_TABLET | Freq: Every day | ORAL | 6 refills | Status: DC

## 2022-04-28 NOTE — Telephone Encounter (Signed)
We have 24-48 hours to send in any refills. Unfortunately sometimes we get busy. Medicine has been sent to his pharmacy, but please let patient know about the policy.

## 2022-04-28 NOTE — Telephone Encounter (Signed)
Called and left message for patient to make him aware of Dr Henriette Combs recommendations regarding his Cologuard results. Per Dr Laural Benes patient would need to have a Colonoscopy, as he has a positive Cologuard and the next recommended steps are to have a Colonoscopy.  OK for PEC to give result note if patient calls back.

## 2022-04-28 NOTE — Telephone Encounter (Signed)
He also had a positive cologuard last year. He cannot have another cologuard. He needs a colonoscopy.

## 2022-04-28 NOTE — Telephone Encounter (Signed)
Copied from CRM [id number]. Topic: General - Other >> Apr 28, 2022 12:53 PM Dominique A wrote: Reason for CRM: Pt is calling back upset that his uric acid medication has not be sent to  [facility] at [address] [place], [place] [address],  [phone]. Per pt if his medication is not called in today he is going to find another PCP. Please call pt back to discuss.

## 2022-04-28 NOTE — Telephone Encounter (Signed)
Pt returned call, note from Dr. Laural Benes reviewed with pt. Verbalizes understanding.  Also reviewed note regarding need for colonoscopy and message from [facility] to schedule. Pt aware, verbalizes understanding.

## 2022-05-01 ENCOUNTER — Encounter: Payer: Self-pay | Admitting: *Deleted

## 2022-05-14 ENCOUNTER — Other Ambulatory Visit: Payer: Self-pay | Admitting: Family Medicine

## 2022-05-16 NOTE — Telephone Encounter (Signed)
Unable to refill per protocol, Rx request is too soon. Last refill 04/25/22 for 100 and 4 refills.  Requested Prescriptions  Pending Prescriptions Disp Refills   levothyroxine (SYNTHROID) 75 MCG tablet [Pharmacy Med Name: Levothyroxine Sodium 75 MCG Oral Tablet] 100 tablet 2    Sig: TAKE 1 TABLET BY MOUTH DAILY  BEFORE BREAKFAST     Endocrinology:  Hypothyroid Agents Passed - 05/14/2022 10:30 PM      Passed - TSH in normal range and within 360 days    TSH  Date Value Ref Range Status  04/21/2022 2.360 0.450 - 4.500 uIU/mL Final         Passed - Valid encounter within last 12 months    Recent Outpatient Visits           3 weeks ago Encounter for [employer] annual wellness exam   [facility] [place], Megan P, DO   7 months ago Routine general medical examination at a health care facility   [facility], [place] P, DO   1 year ago Controlled type 2 diabetes mellitus with complication, without long-term current use of insulin (HCC)   [place] [facility] [place], Megan P, DO   1 year ago Routine general medical examination at a health care facility   [facility] [place], [place] P, DO   2 years ago Primary hypertension   [place] [facility] [place], Oralia Rud, DO       Future Appointments             In 5 months Laural Benes, Oralia Rud, DO  [facility]

## 2022-05-31 DIAGNOSIS — L97412 Non-pressure chronic ulcer of right heel and midfoot with fat layer exposed: Secondary | ICD-10-CM | POA: Diagnosis not present

## 2022-05-31 DIAGNOSIS — E11621 Type 2 diabetes mellitus with foot ulcer: Secondary | ICD-10-CM | POA: Diagnosis not present

## 2022-05-31 DIAGNOSIS — Z7984 Long term (current) use of oral hypoglycemic drugs: Secondary | ICD-10-CM | POA: Diagnosis not present

## 2022-05-31 DIAGNOSIS — L84 Corns and callosities: Secondary | ICD-10-CM | POA: Diagnosis not present

## 2022-06-21 DIAGNOSIS — L97412 Non-pressure chronic ulcer of right heel and midfoot with fat layer exposed: Secondary | ICD-10-CM | POA: Diagnosis not present

## 2022-06-21 DIAGNOSIS — L84 Corns and callosities: Secondary | ICD-10-CM | POA: Diagnosis not present

## 2022-06-21 DIAGNOSIS — Z7984 Long term (current) use of oral hypoglycemic drugs: Secondary | ICD-10-CM | POA: Diagnosis not present

## 2022-06-21 DIAGNOSIS — E11621 Type 2 diabetes mellitus with foot ulcer: Secondary | ICD-10-CM | POA: Diagnosis not present

## 2022-06-22 ENCOUNTER — Telehealth: Payer: Self-pay | Admitting: Family Medicine

## 2022-06-22 NOTE — Telephone Encounter (Signed)
Patient states that his levothyroxine (SYNTHROID) 75 MCG tablet [Pharmacy Med Name: Levothyroxine Sodium 75 MCG Oral Tablet]  was sent to the wrong [facility]. Patient is asking if it can be transferred or resent to  [id number] - [place], [place] - [address] AT [address] Phone: [phone]  Fax: [phone]     Please advise.

## 2022-06-22 NOTE — Telephone Encounter (Signed)
Resent to another pharmacy. [id number] [place], [place] - [address] AT [address] OF [address]

## 2022-06-23 ENCOUNTER — Encounter: Payer: Self-pay | Admitting: Family Medicine

## 2022-06-23 ENCOUNTER — Other Ambulatory Visit: Payer: Self-pay | Admitting: Family Medicine

## 2022-06-25 MED ORDER — CYCLOBENZAPRINE HCL 10 MG PO TABS: 10.0000 mg | ORAL_TABLET | Freq: Every day | ORAL | 0 refills | Status: DC

## 2022-06-25 NOTE — Telephone Encounter (Signed)
You should have a refill on this medication that should be able to be transferred to your local [facility].

## 2022-07-11 ENCOUNTER — Other Ambulatory Visit: Payer: Self-pay | Admitting: Family Medicine

## 2022-07-12 DIAGNOSIS — E11621 Type 2 diabetes mellitus with foot ulcer: Secondary | ICD-10-CM | POA: Diagnosis not present

## 2022-07-12 DIAGNOSIS — Z7984 Long term (current) use of oral hypoglycemic drugs: Secondary | ICD-10-CM | POA: Diagnosis not present

## 2022-07-12 DIAGNOSIS — L97412 Non-pressure chronic ulcer of right heel and midfoot with fat layer exposed: Secondary | ICD-10-CM | POA: Diagnosis not present

## 2022-07-12 DIAGNOSIS — L84 Corns and callosities: Secondary | ICD-10-CM | POA: Diagnosis not present

## 2022-07-12 NOTE — Telephone Encounter (Signed)
Requested Prescriptions  Pending Prescriptions Disp Refills   metFORMIN (GLUCOPHAGE) 500 MG tablet [Pharmacy Med Name: metFORMIN HCl 500 MG Oral Tablet] 100 tablet 1    Sig: TAKE 1 TABLET BY MOUTH DAILY     Endocrinology:  Diabetes - Biguanides Failed - 07/11/2022  4:50 AM      Failed - Cr in normal range and within 360 days    Creatinine, Ser  Date Value Ref Range Status  04/21/2022 1.45 (H) 0.76 - 1.27 mg/dL Final         Failed - eGFR in normal range and within 360 days    GFR calc Af Amer  Date Value Ref Range Status  10/09/2019 64 >59 mL/min/1.73 Final    Comment:    **[facility] currently reports eGFR in compliance with the current**   recommendations of the [employer]. [facility] will   update reporting as new guidelines are published from the NKF-ASN   Task force.    GFR calc non Af Amer  Date Value Ref Range Status  10/09/2019 55 (L) >59 mL/min/1.73 Final   eGFR  Date Value Ref Range Status  04/21/2022 51 (L) >59 mL/min/1.73 Final         Failed - B12 Level in normal range and within 720 days    No results found for: "VITAMINB12"       Passed - HBA1C is between 0 and 7.9 and within 180 days    HB A1C (BAYER DCA - WAIVED)  Date Value Ref Range Status  04/21/2022 5.8 (H) 4.8 - 5.6 % Final    Comment:             Prediabetes: 5.7 - 6.4          Diabetes: >6.4          Glycemic control for adults with diabetes: <7.0          Passed - Valid encounter within last 6 months    Recent Outpatient Visits           2 months ago Encounter for [employer] annual wellness exam   [facility] [place], Megan P, DO   9 months ago Routine general medical examination at a health care facility   [facility] [place] [facility], [place] P, DO   1 year ago Controlled type 2 diabetes mellitus with complication, without long-term current use of insulin (HCC)   [facility] [place], Megan P, DO   1 year ago  Routine general medical examination at a health care facility   [facility] [place], [place] P, DO   2 years ago Primary hypertension   [place] [place], DO       Future Appointments             In 3 months Johnson, Megan P, DO [facility]            Passed - CBC within normal limits and completed in the last 12 months    WBC  Date Value Ref Range Status  04/21/2022 10.0 3.4 - 10.8 x10E3/uL Final   RBC  Date Value Ref Range Status  04/21/2022 5.44 4.14 - 5.80 x10E6/uL Final   Hemoglobin  Date Value Ref Range Status  04/21/2022 15.9 13.0 - 17.7 g/dL Final   Hematocrit  Date Value Ref Range Status  04/21/2022 46.9 37.5 - 51.0 % Final   MCHC  Date Value Ref Range Status  04/21/2022 33.9 31.5 - 35.7 g/dL Final   [facility]  Date Value Ref Range Status  04/21/2022 29.2 26.6 - 33.0 pg Final   MCV  Date Value Ref Range Status  04/21/2022 86 79 - 97 fL Final   No results found for: "PLTCOUNTKUC", "LABPLAT", "POCPLA" RDW  Date Value Ref Range Status  04/21/2022 14.0 11.6 - 15.4 % Final

## 2022-07-25 DIAGNOSIS — M216X2 Other acquired deformities of left foot: Secondary | ICD-10-CM | POA: Diagnosis not present

## 2022-07-25 DIAGNOSIS — E119 Type 2 diabetes mellitus without complications: Secondary | ICD-10-CM | POA: Diagnosis not present

## 2022-07-25 DIAGNOSIS — M216X1 Other acquired deformities of right foot: Secondary | ICD-10-CM | POA: Diagnosis not present

## 2022-08-03 DIAGNOSIS — E11621 Type 2 diabetes mellitus with foot ulcer: Secondary | ICD-10-CM | POA: Diagnosis not present

## 2022-08-03 DIAGNOSIS — L97412 Non-pressure chronic ulcer of right heel and midfoot with fat layer exposed: Secondary | ICD-10-CM | POA: Diagnosis not present

## 2022-08-03 DIAGNOSIS — L84 Corns and callosities: Secondary | ICD-10-CM | POA: Diagnosis not present

## 2022-08-03 DIAGNOSIS — Z7984 Long term (current) use of oral hypoglycemic drugs: Secondary | ICD-10-CM | POA: Diagnosis not present

## 2022-08-18 DIAGNOSIS — M216X2 Other acquired deformities of left foot: Secondary | ICD-10-CM | POA: Diagnosis not present

## 2022-08-18 DIAGNOSIS — E119 Type 2 diabetes mellitus without complications: Secondary | ICD-10-CM | POA: Diagnosis not present

## 2022-08-18 DIAGNOSIS — M216X1 Other acquired deformities of right foot: Secondary | ICD-10-CM | POA: Diagnosis not present

## 2022-08-25 DIAGNOSIS — L84 Corns and callosities: Secondary | ICD-10-CM | POA: Diagnosis not present

## 2022-08-25 DIAGNOSIS — Z7984 Long term (current) use of oral hypoglycemic drugs: Secondary | ICD-10-CM | POA: Diagnosis not present

## 2022-08-25 DIAGNOSIS — E11621 Type 2 diabetes mellitus with foot ulcer: Secondary | ICD-10-CM | POA: Diagnosis not present

## 2022-08-25 DIAGNOSIS — L97412 Non-pressure chronic ulcer of right heel and midfoot with fat layer exposed: Secondary | ICD-10-CM | POA: Diagnosis not present

## 2022-09-26 DIAGNOSIS — L97412 Non-pressure chronic ulcer of right heel and midfoot with fat layer exposed: Secondary | ICD-10-CM | POA: Diagnosis not present

## 2022-09-26 DIAGNOSIS — L84 Corns and callosities: Secondary | ICD-10-CM | POA: Diagnosis not present

## 2022-09-26 DIAGNOSIS — E11621 Type 2 diabetes mellitus with foot ulcer: Secondary | ICD-10-CM | POA: Diagnosis not present

## 2022-09-26 DIAGNOSIS — Z7984 Long term (current) use of oral hypoglycemic drugs: Secondary | ICD-10-CM | POA: Diagnosis not present

## 2022-10-23 ENCOUNTER — Ambulatory Visit (INDEPENDENT_AMBULATORY_CARE_PROVIDER_SITE_OTHER): Payer: Medicare Other | Admitting: Family Medicine

## 2022-10-23 ENCOUNTER — Encounter: Payer: Self-pay | Admitting: Family Medicine

## 2022-10-23 VITALS — BP 172/78 | HR 63 | Ht 73.0 in | Wt 231.4 lb

## 2022-10-23 DIAGNOSIS — E790 Hyperuricemia without signs of inflammatory arthritis and tophaceous disease: Secondary | ICD-10-CM | POA: Diagnosis not present

## 2022-10-23 DIAGNOSIS — R3911 Hesitancy of micturition: Secondary | ICD-10-CM

## 2022-10-23 DIAGNOSIS — R8281 Pyuria: Secondary | ICD-10-CM

## 2022-10-23 DIAGNOSIS — D692 Other nonthrombocytopenic purpura: Secondary | ICD-10-CM

## 2022-10-23 DIAGNOSIS — N1831 Chronic kidney disease, stage 3a: Secondary | ICD-10-CM

## 2022-10-23 DIAGNOSIS — I10 Essential (primary) hypertension: Secondary | ICD-10-CM | POA: Diagnosis not present

## 2022-10-23 DIAGNOSIS — I25118 Atherosclerotic heart disease of native coronary artery with other forms of angina pectoris: Secondary | ICD-10-CM | POA: Diagnosis not present

## 2022-10-23 DIAGNOSIS — Z0000 Encounter for general adult medical examination without abnormal findings: Secondary | ICD-10-CM | POA: Diagnosis not present

## 2022-10-23 DIAGNOSIS — E039 Hypothyroidism, unspecified: Secondary | ICD-10-CM | POA: Diagnosis not present

## 2022-10-23 DIAGNOSIS — E118 Type 2 diabetes mellitus with unspecified complications: Secondary | ICD-10-CM

## 2022-10-23 DIAGNOSIS — R42 Dizziness and giddiness: Secondary | ICD-10-CM | POA: Diagnosis not present

## 2022-10-23 DIAGNOSIS — E782 Mixed hyperlipidemia: Secondary | ICD-10-CM | POA: Diagnosis not present

## 2022-10-23 LAB — URINALYSIS, ROUTINE W REFLEX MICROSCOPIC
Bilirubin, UA: NEGATIVE
Glucose, UA: NEGATIVE
Nitrite, UA: POSITIVE — AB
Specific Gravity, UA: 1.025 (ref 1.005–1.030)
Urobilinogen, Ur: 0.2 mg/dL (ref 0.2–1.0)
pH, UA: 6.5 (ref 5.0–7.5)

## 2022-10-23 LAB — MICROALBUMIN, URINE WAIVED
Creatinine, Urine Waived: 300 mg/dL (ref 10–300)
Microalb, Ur Waived: 150 mg/L — ABNORMAL HIGH (ref 0–19)

## 2022-10-23 LAB — MICROSCOPIC EXAMINATION

## 2022-10-23 MED ORDER — ALLOPURINOL 100 MG PO TABS: 100.0000 mg | ORAL_TABLET | Freq: Every day | ORAL | 1 refills | Status: DC

## 2022-10-23 MED ORDER — AMOXICILLIN-POT CLAVULANATE 875-125 MG PO TABS: 1.0000 | ORAL_TABLET | Freq: Two times a day (BID) | ORAL | 0 refills | Status: DC

## 2022-10-23 MED ORDER — LISINOPRIL 20 MG PO TABS: 20.0000 mg | ORAL_TABLET | Freq: Every day | ORAL | 0 refills | Status: DC

## 2022-10-23 MED ORDER — METFORMIN HCL 500 MG PO TABS: 500.0000 mg | ORAL_TABLET | Freq: Every day | ORAL | 1 refills | Status: DC

## 2022-10-23 MED ORDER — ROSUVASTATIN CALCIUM 5 MG PO TABS: 5.0000 mg | ORAL_TABLET | Freq: Every day | ORAL | 1 refills | Status: DC

## 2022-10-23 NOTE — Assessment & Plan Note (Signed)
Rechecking labs today. Await results. Treat as needed.  °

## 2022-10-23 NOTE — Assessment & Plan Note (Signed)
Stable.       - Continue to monitor

## 2022-10-23 NOTE — Assessment & Plan Note (Signed)
Will keep BP, sugar and cholesterol under good control. Continue to follow with cardiology. Call with any concerns.

## 2022-10-23 NOTE — Assessment & Plan Note (Signed)
Under good control on current regimen. Continue current regimen. Continue to monitor. Call with any concerns. Refills given. Labs drawn today.   

## 2022-10-23 NOTE — Progress Notes (Signed)
BP (!) 172/78 (BP Location: Left Arm, Cuff Size: Normal)   Pulse 63   Ht 6\' 1"  (1.854 m)   Wt 231 lb 6.4 oz (105 kg)   SpO2 97%   BMI 30.53 kg/m    Subjective:    Patient ID: Kenneth Johnston, male    DOB: Sep 20, 1950, 72 y.o.   MRN: [id number]  HPI: Kenneth Johnston is a 72 y.o. male presenting on 10/23/2022 for comprehensive medical examination. Current medical complaints include:  Has been having chills and sweats for the past 3 days. He notes that his has been having dizziness. Has been having issues with changes in his vision. Has been feeling lightheaded for about 5 days. Has been having pretty excessive sweating for about 3 months especially with any activity. Has had a headache starting on Wednesday  DIABETES Hypoglycemic episodes:no Polydipsia/polyuria: no Visual disturbance: yes Chest pain: no Paresthesias: no Glucose Monitoring: no  Accucheck frequency: about every 2 weeks Taking Insulin?: no Blood Pressure Monitoring: a few times a month Retinal Examination: Not up to Date Foot Exam: Up to Date Diabetic Education: Completed Pneumovax: Not up to Date Influenza: Not up to Date Aspirin: yes  No gout flares. Tolerating medicine well.   HYPERTENSION / HYPERLIPIDEMIA Satisfied with current treatment? no Duration of hypertension: chronic BP monitoring frequency: a few times a month BP medication side effects: no Past BP meds: lisinopril Duration of hyperlipidemia: chronic Cholesterol medication side effects: no Cholesterol supplements: none Past cholesterol medications: crestor Medication compliance: excellent compliance Aspirin: yes Recent stressors: no Recurrent headaches: no Visual changes: yes Palpitations: no Dyspnea: no Chest pain: no Lower extremity edema: no Dizzy/lightheaded: yes  HYPOTHYROIDISM Thyroid control status:thinks it may be off Satisfied with current treatment? no Medication side effects: no Medication compliance: excellent  compliance Recent dose adjustment:no Fatigue: yes Cold intolerance: no Heat intolerance: yes Weight gain: no Weight loss: no Constipation: no Diarrhea/loose stools: no Palpitations: no Lower extremity edema: no Anxiety/depressed mood: no  He currently lives with: wife Interim Problems from his last visit: no  Depression Screen done today and results listed below:     10/23/2022   10:08 AM 04/21/2022   10:24 AM 10/12/2021    9:39 AM 04/11/2021   10:32 AM 10/15/2020    1:02 PM  Depression screen PHQ 2/9  Decreased Interest 0 0 0 0 0  Down, Depressed, Hopeless 0 0 0 0 0  PHQ - 2 Score 0 0 0 0 0  Altered sleeping 0 0 0 0   Tired, decreased energy 3 0 3 0   Change in appetite 0 0 0 0   Feeling bad or failure about yourself  0 0 0 0   Trouble concentrating 0 0 0 0   Moving slowly or fidgety/restless 0 0 0 0   Suicidal thoughts 0 0 0 0   PHQ-9 Score 3 0 3 0   Difficult doing work/chores Extremely dIfficult Not difficult at all Not difficult at all      Past Medical History:  Past Medical History:  Diagnosis Date   Allergy    Arthritis    CKD (chronic kidney disease), stage III (HCC)    Coronary artery calcification    Hyperlipidemia    Hypertension    Hypothyroidism    Umbilical hernia 03/04/2015    Surgical History:  Past Surgical History:  Procedure Laterality Date   KNEE CARTILAGE SURGERY Bilateral     Medications:  Current Outpatient Medications on File Prior to  Visit  Medication Sig   aspirin EC 81 MG tablet Take 1 tablet (81 mg total) by mouth daily.   cyclobenzaprine (FLEXERIL) 10 MG tablet Take 1 tablet (10 mg total) by mouth at bedtime.   levothyroxine (SYNTHROID) 75 MCG tablet TAKE 1 TABLET BY MOUTH DAILY  BEFORE BREAKFAST   meloxicam (MOBIC) 15 MG tablet TAKE 1 TABLET BY MOUTH DAILY   omeprazole (PRILOSEC) 40 MG capsule Take 1 capsule (40 mg total) by mouth daily.   No current facility-administered medications on file prior to visit.    Allergies:   No Known Allergies  Social History:  Social History   Socioeconomic History   Marital status: Married    Spouse name: Not on file   Number of children: Not on file   Years of education: Not on file   Highest education level: 12th grade  Occupational History   Not on file  Tobacco Use   Smoking status: Former    Current packs/day: 0.00    Types: Cigarettes    Quit date: 03/04/1995    Years since quitting: 27.6   Smokeless tobacco: Never  Vaping Use   Vaping status: Never Used  Substance and Sexual Activity   Alcohol use: Yes    Comment: very seldom    Drug use: No   Sexual activity: Not Currently  Other Topics Concern   Not on file  Social History Narrative   [place]    Social Determinants of Health   Financial Resource Strain: Low Risk  (04/19/2022)   Overall Financial Resource Strain (CARDIA)    Difficulty of Paying Living Expenses: Not hard at all  Food Insecurity: No Food Insecurity (04/19/2022)   Hunger Vital Sign    Worried About Running Out of Food in the Last Year: Never true    Ran Out of Food in the Last Year: Never true  Transportation Needs: No Transportation Needs (04/19/2022)   PRAPARE - Administrator, Civil Service (Medical): No    Lack of Transportation (Non-Medical): No  Physical Activity: Sufficiently Active (04/19/2022)   Exercise Vital Sign    Days of Exercise per Week: 3 days    Minutes of Exercise per Session: 60 min  Stress: No Stress Concern Present (04/19/2022)   [employer] of Occupational Health - Occupational Stress Questionnaire    Feeling of Stress : Not at all  Social Connections: Socially Integrated (04/19/2022)   Social Connection and Isolation Panel [NHANES]    Frequency of Communication with Friends and Family: More than three times a week    Frequency of Social Gatherings with Friends and Family: Three times a week    Attends Religious Services: More than 4 times per year    Active Member of Clubs or Organizations: Yes     Attends Banker Meetings: More than 4 times per year    Marital Status: Married  Catering manager Violence: Not At Risk (05/17/2017)   Humiliation, Afraid, Rape, and Kick questionnaire    Fear of Current or Ex-Partner: No    Emotionally Abused: No    Physically Abused: No    Sexually Abused: No   Social History   Tobacco Use  Smoking Status Former   Current packs/day: 0.00   Types: Cigarettes   Quit date: 03/04/1995   Years since quitting: 27.6  Smokeless Tobacco Never   Social History   Substance and Sexual Activity  Alcohol Use Yes   Comment: very seldom     Family History:  Family History  Problem Relation Age of Onset   Kidney failure Mother    Aneurysm Father        brain   Kidney disease Paternal Grandmother    Heart attack Paternal Grandfather     Past medical history, surgical history, medications, allergies, family history and social history reviewed with patient today and changes made to appropriate areas of the chart.   Review of Systems  Constitutional:  Positive for diaphoresis and malaise/fatigue. Negative for chills, fever and weight loss.  HENT: Negative.    Eyes:  Positive for blurred vision. Negative for double vision, photophobia, pain, discharge and redness.  Respiratory:  Positive for cough. Negative for hemoptysis, sputum production, shortness of breath and wheezing.   Cardiovascular: Negative.   Gastrointestinal: Negative.   Genitourinary:  Positive for frequency. Negative for dysuria, flank pain, hematuria and urgency.  Musculoskeletal: Negative.   Skin: Negative.   Neurological:  Positive for dizziness and headaches. Negative for tingling, tremors, sensory change, speech change, focal weakness, seizures, loss of consciousness and weakness.  Endo/Heme/Allergies:  Negative for environmental allergies and polydipsia. Bruises/bleeds easily.  Psychiatric/Behavioral: Negative.     All other ROS negative except what is listed above  and in the HPI.      Objective:    BP (!) 172/78 (BP Location: Left Arm, Cuff Size: Normal)   Pulse 63   Ht 6\' 1"  (1.854 m)   Wt 231 lb 6.4 oz (105 kg)   SpO2 97%   BMI 30.53 kg/m   Wt Readings from Last 3 Encounters:  10/23/22 231 lb 6.4 oz (105 kg)  04/21/22 230 lb 9.6 oz (104.6 kg)  12/13/21 232 lb (105.2 kg)    Physical Exam Vitals and nursing note reviewed.  Constitutional:      General: He is not in acute distress.    Appearance: Normal appearance. He is obese. He is not ill-appearing, toxic-appearing or diaphoretic.  HENT:     Head: Normocephalic and atraumatic.     Right Ear: Tympanic membrane, ear canal and external ear normal. There is no impacted cerumen.     Left Ear: Tympanic membrane, ear canal and external ear normal. There is no impacted cerumen.     Nose: Nose normal. No congestion or rhinorrhea.     Mouth/Throat:     Mouth: Mucous membranes are moist.     Pharynx: Oropharynx is clear. No oropharyngeal exudate or posterior oropharyngeal erythema.  Eyes:     General: No scleral icterus.       Right eye: No discharge.        Left eye: No discharge.     Extraocular Movements: Extraocular movements intact.     Conjunctiva/sclera: Conjunctivae normal.     Pupils: Pupils are equal, round, and reactive to light.  Neck:     Vascular: No carotid bruit.  Cardiovascular:     Rate and Rhythm: Normal rate and regular rhythm.     Pulses: Normal pulses.     Heart sounds: No murmur heard.    No friction rub. No gallop.  Pulmonary:     Effort: Pulmonary effort is normal. No respiratory distress.     Breath sounds: Normal breath sounds. No stridor. No wheezing, rhonchi or rales.  Chest:     Chest wall: No tenderness.  Abdominal:     General: Abdomen is flat. Bowel sounds are normal. There is no distension.     Palpations: Abdomen is soft. There is no mass.  Tenderness: There is no abdominal tenderness. There is no right CVA tenderness, left CVA tenderness,  guarding or rebound.     Hernia: No hernia is present.  Genitourinary:    Comments: Genital exam deferred with shared decision making Musculoskeletal:        General: No swelling, tenderness, deformity or signs of injury.     Cervical back: Normal range of motion and neck supple. No rigidity. No muscular tenderness.     Right lower leg: No edema.     Left lower leg: No edema.  Lymphadenopathy:     Cervical: No cervical adenopathy.  Skin:    General: Skin is warm and dry.     Capillary Refill: Capillary refill takes less than 2 seconds.     Coloration: Skin is not jaundiced or pale.     Findings: No bruising, erythema, lesion or rash.  Neurological:     General: No focal deficit present.     Mental Status: He is alert and oriented to person, place, and time.     Cranial Nerves: No cranial nerve deficit.     Sensory: No sensory deficit.     Motor: No weakness.     Coordination: Coordination normal.     Gait: Gait normal.     Deep Tendon Reflexes: Reflexes normal.  Psychiatric:        Mood and Affect: Mood normal.        Behavior: Behavior normal.        Thought Content: Thought content normal.        Judgment: Judgment normal.     Results for orders placed or performed in visit on 10/23/22  Microscopic Examination   Urine  Result Value Ref Range   WBC, UA 11-30 (A) 0 - 5 /hpf   RBC, Urine 0-2 0 - 2 /hpf   Epithelial Cells (non renal) 0-10 0 - 10 /hpf   Bacteria, UA Many (A) None seen/Few  Urinalysis, Routine w reflex microscopic  Result Value Ref Range   Specific Gravity, UA 1.025 1.005 - 1.030   pH, UA 6.5 5.0 - 7.5   Color, UA Yellow Yellow   Appearance Ur Cloudy (A) Clear   Leukocytes,UA 1+ (A) Negative   Protein,UA 1+ (A) Negative/Trace   Glucose, UA Negative Negative   Ketones, UA Trace (A) Negative   RBC, UA Trace (A) Negative   Bilirubin, UA Negative Negative   Urobilinogen, Ur 0.2 0.2 - 1.0 mg/dL   Nitrite, UA Positive (A) Negative   Microscopic  Examination See below:   Microalbumin, Urine Waived  Result Value Ref Range   Microalb, Ur Waived 150 (H) 0 - 19 mg/L   Creatinine, Urine Waived 300 10 - 300 mg/dL   Microalb/Creat Ratio 30-300 (H) <30 mg/g      Assessment & Plan:   Problem List Items Addressed This Visit       Cardiovascular and Mediastinum   HTN (hypertension)    Running high. Will increase his lisinopril to 20mg  and recheck in about a month. Call with any concerns.       Relevant Medications   lisinopril (ZESTRIL) 20 MG tablet   rosuvastatin (CRESTOR) 5 MG tablet   Other Relevant Orders   Comprehensive metabolic panel   CBC with Differential/Platelet   Microalbumin, Urine Waived (Completed)   Senile purpura (HCC)    Stable. Continue to monitor.       Relevant Medications   lisinopril (ZESTRIL) 20 MG tablet   rosuvastatin (CRESTOR) 5  MG tablet   Other Relevant Orders   Comprehensive metabolic panel   CBC with Differential/Platelet   Coronary artery disease of native artery of native heart with stable angina pectoris (HCC)    Will keep BP, sugar and cholesterol under good control. Continue to follow with cardiology. Call with any concerns.       Relevant Medications   lisinopril (ZESTRIL) 20 MG tablet   rosuvastatin (CRESTOR) 5 MG tablet   Other Relevant Orders   Comprehensive metabolic panel   CBC with Differential/Platelet   EKG 12-Lead (Completed)     Endocrine   Hypothyroidism    Rechecking labs today. Await results. Treat as needed.       Relevant Orders   Comprehensive metabolic panel   CBC with Differential/Platelet   TSH   Controlled type 2 diabetes mellitus with complication, without long-term current use of insulin (HCC)    Rechecking labs today. Await results. Treat as needed.       Relevant Medications   lisinopril (ZESTRIL) 20 MG tablet   rosuvastatin (CRESTOR) 5 MG tablet   metFORMIN (GLUCOPHAGE) 500 MG tablet   Other Relevant Orders   Comprehensive metabolic panel    CBC with Differential/Platelet   Urinalysis, Routine w reflex microscopic (Completed)   Microalbumin, Urine Waived (Completed)   Hgb A1c w/o eAG     Genitourinary   Chronic kidney disease, stage 3a (HCC)    Rechecking labs today. Await results. Treat as needed.       Relevant Orders   Comprehensive metabolic panel   CBC with Differential/Platelet     Other   Hyperlipidemia    Under good control on current regimen. Continue current regimen. Continue to monitor. Call with any concerns. Refills given. Labs drawn today.        Relevant Medications   lisinopril (ZESTRIL) 20 MG tablet   rosuvastatin (CRESTOR) 5 MG tablet   Other Relevant Orders   Comprehensive metabolic panel   CBC with Differential/Platelet   Lipid Panel w/o Chol/HDL Ratio   Elevated uric acid in blood    Rechecking labs today. Await results. Treat as needed.       Relevant Orders   Uric acid   Other Visit Diagnoses     Routine general medical examination at a health care facility    -  Primary   Hesitancy       Labs drawn today. Await results.   Relevant Orders   PSA   Pyuria       Will send for culture and treat empirically with augmentin. Call with any concerns.   Relevant Orders   Urine Culture   Dizziness       Similar symptoms prior to getting his thyroid treatment. Will recheck labs. Await results. If normal will get him into cardiology.   Relevant Orders   EKG 12-Lead (Completed)        LABORATORY TESTING:  Health maintenance labs ordered today as discussed above.   The natural history of prostate cancer and ongoing controversy regarding screening and potential treatment outcomes of prostate cancer has been discussed with the patient. The meaning of a false positive PSA and a false negative PSA has been discussed. He indicates understanding of the limitations of this screening test and wishes to proceed with screening PSA testing.   IMMUNIZATIONS:   - Tdap: Tetanus vaccination status  reviewed: last tetanus booster within 10 years. - Influenza: Refused - Pneumovax: Refused - Prevnar: Refused - COVID: Refused - HPV: Not  applicable - Shingrix vaccine: Refused  SCREENING: - Colonoscopy: Up to date  Discussed with patient purpose of the colonoscopy is to detect colon cancer at curable precancerous or early stages   PATIENT COUNSELING:    Sexuality: Discussed sexually transmitted diseases, partner selection, use of condoms, avoidance of unintended pregnancy  and contraceptive alternatives.   Advised to avoid cigarette smoking.  I discussed with the patient that most people either abstain from alcohol or drink within safe limits (<=14/week and <=4 drinks/occasion for males, <=7/weeks and <= 3 drinks/occasion for females) and that the risk for alcohol disorders and other health effects rises proportionally with the number of drinks per week and how often a drinker exceeds daily limits.  Discussed cessation/primary prevention of drug use and availability of treatment for abuse.   Diet: Encouraged to adjust caloric intake to maintain  or achieve ideal body weight, to reduce intake of dietary saturated fat and total fat, to limit sodium intake by avoiding high sodium foods and not adding table salt, and to maintain adequate dietary potassium and calcium preferably from fresh fruits, vegetables, and low-fat dairy products.    stressed the importance of regular exercise  Injury prevention: Discussed safety belts, safety helmets, smoke detector, smoking near bedding or upholstery.   Dental health: Discussed importance of regular tooth brushing, flossing, and dental visits.   Follow up plan: NEXT PREVENTATIVE PHYSICAL DUE IN 1 YEAR. Return for diabetic eye exam list please. 6 weeks with me please.

## 2022-10-23 NOTE — Assessment & Plan Note (Signed)
Running high. Will increase his lisinopril to 20mg  and recheck in about a month. Call with any concerns.

## 2022-10-24 DIAGNOSIS — E119 Type 2 diabetes mellitus without complications: Secondary | ICD-10-CM | POA: Diagnosis not present

## 2022-10-24 DIAGNOSIS — Z7984 Long term (current) use of oral hypoglycemic drugs: Secondary | ICD-10-CM | POA: Diagnosis not present

## 2022-10-24 DIAGNOSIS — Z8631 Personal history of diabetic foot ulcer: Secondary | ICD-10-CM | POA: Diagnosis not present

## 2022-10-24 DIAGNOSIS — L84 Corns and callosities: Secondary | ICD-10-CM | POA: Diagnosis not present

## 2022-10-24 LAB — COMPREHENSIVE METABOLIC PANEL
ALT: 21 [IU]/L (ref 0–44)
AST: 16 [IU]/L (ref 0–40)
Albumin: 4.3 g/dL (ref 3.8–4.8)
Alkaline Phosphatase: 65 [IU]/L (ref 44–121)
BUN/Creatinine Ratio: 10 (ref 10–24)
BUN: 14 mg/dL (ref 8–27)
Bilirubin Total: 0.4 mg/dL (ref 0.0–1.2)
CO2: 24 mmol/L (ref 20–29)
Calcium: 9.2 mg/dL (ref 8.6–10.2)
Chloride: 106 mmol/L (ref 96–106)
Creatinine, Ser: 1.4 mg/dL — ABNORMAL HIGH (ref 0.76–1.27)
Globulin, Total: 2.8 g/dL (ref 1.5–4.5)
Glucose: 111 mg/dL — ABNORMAL HIGH (ref 70–99)
Potassium: 4.2 mmol/L (ref 3.5–5.2)
Sodium: 145 mmol/L — ABNORMAL HIGH (ref 134–144)
Total Protein: 7.1 g/dL (ref 6.0–8.5)
eGFR: 53 mL/min/{1.73_m2} — ABNORMAL LOW (ref >59)

## 2022-10-24 LAB — CBC WITH DIFFERENTIAL/PLATELET
Basophils Absolute: 0 10*3/uL (ref 0.0–0.2)
Basos: 0 %
EOS (ABSOLUTE): 0.1 10*3/uL (ref 0.0–0.4)
Eos: 1 %
Hematocrit: 48.8 % (ref 37.5–51.0)
Hemoglobin: 16.2 g/dL (ref 13.0–17.7)
Immature Grans (Abs): 0.1 10*3/uL (ref 0.0–0.1)
Immature Granulocytes: 1 %
Lymphocytes Absolute: 1.5 10*3/uL (ref 0.7–3.1)
Lymphs: 13 %
MCH: 29.1 pg (ref 26.6–33.0)
MCHC: 33.2 g/dL (ref 31.5–35.7)
MCV: 88 fL (ref 79–97)
Monocytes Absolute: 0.8 10*3/uL (ref 0.1–0.9)
Monocytes: 6 %
Neutrophils Absolute: 9.3 10*3/uL — ABNORMAL HIGH (ref 1.4–7.0)
Neutrophils: 79 %
Platelets: 273 10*3/uL (ref 150–450)
RBC: 5.56 x10E6/uL (ref 4.14–5.80)
RDW: 13.1 % (ref 11.6–15.4)
WBC: 11.8 10*3/uL — ABNORMAL HIGH (ref 3.4–10.8)

## 2022-10-24 LAB — PSA: Prostate Specific Ag, Serum: 7.1 ng/mL — ABNORMAL HIGH (ref 0.0–4.0)

## 2022-10-24 LAB — LIPID PANEL W/O CHOL/HDL RATIO
Cholesterol, Total: 144 mg/dL (ref 100–199)
HDL: 34 mg/dL — ABNORMAL LOW (ref >39)
LDL Chol Calc (NIH): 83 mg/dL (ref 0–99)
Triglycerides: 155 mg/dL — ABNORMAL HIGH (ref 0–149)
VLDL Cholesterol Cal: 27 mg/dL (ref 5–40)

## 2022-10-24 LAB — HGB A1C W/O EAG: Hgb A1c MFr Bld: 5.9 % — ABNORMAL HIGH (ref 4.8–5.6)

## 2022-10-24 LAB — TSH: TSH: 1.18 u[IU]/mL (ref 0.450–4.500)

## 2022-10-24 LAB — URIC ACID: Uric Acid: 6.4 mg/dL (ref 3.8–8.4)

## 2022-10-25 ENCOUNTER — Encounter: Payer: Self-pay | Admitting: Family Medicine

## 2022-10-27 ENCOUNTER — Encounter: Payer: Self-pay | Admitting: Family Medicine

## 2022-10-27 LAB — URINE CULTURE

## 2022-10-29 ENCOUNTER — Encounter: Payer: Self-pay | Admitting: Family Medicine

## 2022-10-29 NOTE — Progress Notes (Signed)
Interpreted by me 10/23/22. NSR at 68bpm with PACs. No ST segment changes.

## 2022-11-15 ENCOUNTER — Ambulatory Visit (INDEPENDENT_AMBULATORY_CARE_PROVIDER_SITE_OTHER): Payer: Medicare Other | Admitting: Family Medicine

## 2022-11-15 DIAGNOSIS — Z7984 Long term (current) use of oral hypoglycemic drugs: Secondary | ICD-10-CM

## 2022-11-15 DIAGNOSIS — E118 Type 2 diabetes mellitus with unspecified complications: Secondary | ICD-10-CM

## 2022-11-15 LAB — HM DIABETES EYE EXAM

## 2022-11-15 NOTE — Progress Notes (Signed)
Kenneth Johnston arrived 11/15/2022 and has given verbal consent to obtain images and complete their overdue diabetic retinal screening.  The images have been sent to an ophthalmologist or optometrist for review and interpretation.  Results will be sent back to [name], DO for review.  Patient has been informed they will be contacted when we receive the results via telephone or MyChart

## 2022-11-23 ENCOUNTER — Encounter: Payer: Self-pay | Admitting: Family Medicine

## 2022-11-24 ENCOUNTER — Telehealth: Payer: Self-pay | Admitting: Family Medicine

## 2022-11-24 DIAGNOSIS — E118 Type 2 diabetes mellitus with unspecified complications: Secondary | ICD-10-CM

## 2022-11-24 DIAGNOSIS — R9389 Abnormal findings on diagnostic imaging of other specified body structures: Secondary | ICD-10-CM

## 2022-11-24 NOTE — Telephone Encounter (Signed)
-----   Message from [facility] [place] R sent at 11/23/2022  4:40 PM EST ----- Needs referral to eye specialist

## 2022-11-27 ENCOUNTER — Telehealth: Payer: Self-pay | Admitting: Emergency Medicine

## 2022-11-27 DIAGNOSIS — Z79899 Other long term (current) drug therapy: Secondary | ICD-10-CM

## 2022-11-27 MED ORDER — EZETIMIBE 10 MG PO TABS: 10.0000 mg | ORAL_TABLET | Freq: Every day | ORAL | 3 refills | Status: DC

## 2022-11-27 NOTE — Telephone Encounter (Signed)
Called patient and left a message to call back.  Calling patient to inform him of the following from Dr. Mariah Milling.  Would recommend he start Zetia 10 mg daily which is not a statin and should not cause muscle issues He can take Crestor during the week as his body will tolerate even if it is 1-3 times a week Recheck lipids in 3 months Thx TGollan

## 2022-12-04 ENCOUNTER — Encounter: Payer: Self-pay | Admitting: Family Medicine

## 2022-12-04 ENCOUNTER — Ambulatory Visit (INDEPENDENT_AMBULATORY_CARE_PROVIDER_SITE_OTHER): Payer: Medicare Other | Admitting: Family Medicine

## 2022-12-04 VITALS — BP 161/73 | HR 60 | Temp 97.8°F | Resp 15 | Wt 236.5 lb

## 2022-12-04 DIAGNOSIS — E782 Mixed hyperlipidemia: Secondary | ICD-10-CM

## 2022-12-04 DIAGNOSIS — R972 Elevated prostate specific antigen [PSA]: Secondary | ICD-10-CM | POA: Diagnosis not present

## 2022-12-04 DIAGNOSIS — I10 Essential (primary) hypertension: Secondary | ICD-10-CM | POA: Diagnosis not present

## 2022-12-04 MED ORDER — LISINOPRIL 30 MG PO TABS: 30.0000 mg | ORAL_TABLET | Freq: Every day | ORAL | 0 refills | Status: DC

## 2022-12-04 NOTE — Assessment & Plan Note (Signed)
Recently switched to zetia by cardiology. Too soon to recheck labs. Feeling better. Continue to monitor.

## 2022-12-04 NOTE — Assessment & Plan Note (Signed)
Better, but still running high. Will increase his lisinopril to 30mg  and recheck in 6-8 weeks. Call with any concerns

## 2022-12-04 NOTE — Progress Notes (Signed)
BP (!) 161/73   Pulse 60   Temp 97.8 F (36.6 C) (Oral)   Resp 15   Wt 236 lb 8 oz (107.3 kg)   SpO2 97%   BMI 31.20 kg/m    Subjective:    Patient ID: Kenneth Johnston, male    DOB: 1950/02/13, 72 y.o.   MRN: [id number]  HPI: Kenneth Johnston is a 72 y.o. male  Chief Complaint  Patient presents with   Hypertension   Follow-up    Feeling better, still gets tired    Cough    Early morning with mucos   Feeling better from the UTI. Much less dizzy. Had his crestor switched to zetia. Feeling much more like himself  HYPERTENSION / HYPERLIPIDEMIA Satisfied with current treatment? yes Duration of hypertension: chronic BP monitoring frequency: rarely BP medication side effects: no Past BP meds: lisinopril Duration of hyperlipidemia: chronic Cholesterol medication side effects: yes- aching Cholesterol supplements: none Past cholesterol medications: crestor, zetia Medication compliance: good compliance Aspirin: yes Recent stressors: no Recurrent headaches: no Visual changes: no Palpitations: no Dyspnea: no Chest pain: no Lower extremity edema: no Dizzy/lightheaded: no   Relevant past medical, surgical, family and social history reviewed and updated as indicated. Interim medical history since our last visit reviewed. Allergies and medications reviewed and updated.  Review of Systems  Constitutional: Negative.   Respiratory: Negative.    Cardiovascular: Negative.   Gastrointestinal: Negative.   Musculoskeletal: Negative.   Psychiatric/Behavioral: Negative.      Per HPI unless specifically indicated above     Objective:    BP (!) 161/73   Pulse 60   Temp 97.8 F (36.6 C) (Oral)   Resp 15   Wt 236 lb 8 oz (107.3 kg)   SpO2 97%   BMI 31.20 kg/m   Wt Readings from Last 3 Encounters:  12/04/22 236 lb 8 oz (107.3 kg)  10/23/22 231 lb 6.4 oz (105 kg)  04/21/22 230 lb 9.6 oz (104.6 kg)    Physical Exam Vitals and nursing note reviewed.  Constitutional:       General: He is not in acute distress.    Appearance: Normal appearance. He is not ill-appearing, toxic-appearing or diaphoretic.  HENT:     Head: Normocephalic and atraumatic.     Right Ear: External ear normal.     Left Ear: External ear normal.     Nose: Nose normal.     Mouth/Throat:     Mouth: Mucous membranes are moist.     Pharynx: Oropharynx is clear.  Eyes:     General: No scleral icterus.       Right eye: No discharge.        Left eye: No discharge.     Extraocular Movements: Extraocular movements intact.     Conjunctiva/sclera: Conjunctivae normal.     Pupils: Pupils are equal, round, and reactive to light.  Cardiovascular:     Rate and Rhythm: Normal rate and regular rhythm.     Pulses: Normal pulses.     Heart sounds: Normal heart sounds. No murmur heard.    No friction rub. No gallop.  Pulmonary:     Effort: Pulmonary effort is normal. No respiratory distress.     Breath sounds: Normal breath sounds. No stridor. No wheezing, rhonchi or rales.  Chest:     Chest wall: No tenderness.  Musculoskeletal:        General: Normal range of motion.     Cervical back: Normal range  of motion and neck supple.  Skin:    General: Skin is warm and dry.     Capillary Refill: Capillary refill takes less than 2 seconds.     Coloration: Skin is not jaundiced or pale.     Findings: No bruising, erythema, lesion or rash.  Neurological:     General: No focal deficit present.     Mental Status: He is alert and oriented to person, place, and time. Mental status is at baseline.  Psychiatric:        Mood and Affect: Mood normal.        Behavior: Behavior normal.        Thought Content: Thought content normal.        Judgment: Judgment normal.     Results for orders placed or performed in visit on 11/23/22  HM DIABETES EYE EXAM  Result Value Ref Range   HM Diabetic Eye Exam No Retinopathy No Retinopathy      Assessment & Plan:   Problem List Items Addressed This Visit        Cardiovascular and Mediastinum   HTN (hypertension) - Primary    Better, but still running high. Will increase his lisinopril to 30mg  and recheck in 6-8 weeks. Call with any concerns      Relevant Medications   lisinopril (ZESTRIL) 30 MG tablet   Other Relevant Orders   Basic metabolic panel     Other   Hyperlipidemia    Recently switched to zetia by cardiology. Too soon to recheck labs. Feeling better. Continue to monitor.       Relevant Medications   lisinopril (ZESTRIL) 30 MG tablet   Other Visit Diagnoses     Elevated PSA       Rechecking labs today. Await results. Treat as needed.   Relevant Orders   PSA        Follow up plan: Return 6-8 weeks.

## 2022-12-05 LAB — BASIC METABOLIC PANEL
BUN/Creatinine Ratio: 12 (ref 10–24)
BUN: 16 mg/dL (ref 8–27)
CO2: 23 mmol/L (ref 20–29)
Calcium: 9.8 mg/dL (ref 8.6–10.2)
Chloride: 104 mmol/L (ref 96–106)
Creatinine, Ser: 1.38 mg/dL — ABNORMAL HIGH (ref 0.76–1.27)
Glucose: 140 mg/dL — ABNORMAL HIGH (ref 70–99)
Potassium: 4.6 mmol/L (ref 3.5–5.2)
Sodium: 142 mmol/L (ref 134–144)
eGFR: 54 mL/min/{1.73_m2} — ABNORMAL LOW (ref >59)

## 2022-12-05 LAB — PSA: Prostate Specific Ag, Serum: 5.5 ng/mL — ABNORMAL HIGH (ref 0.0–4.0)

## 2022-12-08 ENCOUNTER — Ambulatory Visit: Payer: Medicare Other | Admitting: Student

## 2022-12-13 ENCOUNTER — Ambulatory Visit: Payer: Medicare Other | Admitting: Student

## 2022-12-18 NOTE — Telephone Encounter (Signed)
Per patients chart, he had a diabetic eye exam on 11/23/2022. No retinopathy bilateral

## 2022-12-22 ENCOUNTER — Ambulatory Visit: Payer: Medicare Other | Admitting: Student

## 2022-12-26 ENCOUNTER — Other Ambulatory Visit: Payer: Self-pay | Admitting: Family Medicine

## 2022-12-28 MED ORDER — CYCLOBENZAPRINE HCL 10 MG PO TABS: 10.0000 mg | ORAL_TABLET | Freq: Every day | ORAL | 0 refills | Status: AC

## 2023-01-15 ENCOUNTER — Ambulatory Visit: Payer: Medicare Other | Admitting: Family Medicine

## 2023-01-25 ENCOUNTER — Encounter: Payer: Self-pay | Admitting: Family Medicine

## 2023-01-25 ENCOUNTER — Ambulatory Visit (INDEPENDENT_AMBULATORY_CARE_PROVIDER_SITE_OTHER): Payer: Medicare Other | Admitting: Family Medicine

## 2023-01-25 VITALS — BP 154/80 | HR 71 | Wt 236.4 lb

## 2023-01-25 DIAGNOSIS — E118 Type 2 diabetes mellitus with unspecified complications: Secondary | ICD-10-CM

## 2023-01-25 DIAGNOSIS — E782 Mixed hyperlipidemia: Secondary | ICD-10-CM | POA: Diagnosis not present

## 2023-01-25 DIAGNOSIS — I10 Essential (primary) hypertension: Secondary | ICD-10-CM | POA: Diagnosis not present

## 2023-01-25 DIAGNOSIS — Z7984 Long term (current) use of oral hypoglycemic drugs: Secondary | ICD-10-CM | POA: Diagnosis not present

## 2023-01-25 LAB — BAYER DCA HB A1C WAIVED: HB A1C (BAYER DCA - WAIVED): 5.8 % — ABNORMAL HIGH (ref 4.8–5.6)

## 2023-01-25 MED ORDER — LISINOPRIL 40 MG PO TABS: 40.0000 mg | ORAL_TABLET | Freq: Every day | ORAL | 0 refills | Status: DC

## 2023-01-25 NOTE — Progress Notes (Signed)
 BP (!) 154/80   Pulse 71   Wt 236 lb 6.4 oz (107.2 kg)   SpO2 98%   BMI 31.19 kg/m    Subjective:    Patient ID: Kenneth Johnston, male    DOB: [birth year], 73 y.o.   MRN: [id number]  HPI: Kenneth Johnston is a 73 y.o. male  Chief Complaint  Patient presents with   Diabetes   Hypertension    Patient says he is still noticing some elevation in his Blood Pressure readings. Patient says he does feel better.    DIABETES Hypoglycemic episodes:no Polydipsia/polyuria: no Visual disturbance: no Chest pain: no Paresthesias: no Glucose Monitoring: yes  Accucheck frequency: Daily Taking Insulin?: no Blood Pressure Monitoring: not checking Retinal Examination: Up to Date Foot Exam: Up to Date Diabetic Education: Completed Pneumovax: Not up to Date Influenza: Not up to Date Aspirin : yes  HYPERTENSION / HYPERLIPIDEMIA Satisfied with current treatment? yes Duration of hypertension: chronic BP monitoring frequency: not checking BP range:  140s-150s BP medication side effects: no Past BP meds: lisinopril  Duration of hyperlipidemia: chronic Cholesterol medication side effects: no Cholesterol supplements: none Past cholesterol medications: zetia  Medication compliance: excellent compliance Aspirin : yes Recent stressors: no Recurrent headaches: no Visual changes: no Palpitations: no Dyspnea: no Chest pain: no Lower extremity edema: no Dizzy/lightheaded: no   Relevant past medical, surgical, family and social history reviewed and updated as indicated. Interim medical history since our last visit reviewed. Allergies and medications reviewed and updated.  Review of Systems  Constitutional: Negative.   Respiratory: Negative.    Cardiovascular: Negative.   Gastrointestinal: Negative.   Musculoskeletal: Negative.   Psychiatric/Behavioral: Negative.      Per HPI unless specifically indicated above     Objective:    BP (!) 154/80   Pulse 71   Wt 236 lb 6.4 oz  (107.2 kg)   SpO2 98%   BMI 31.19 kg/m   Wt Readings from Last 3 Encounters:  01/25/23 236 lb 6.4 oz (107.2 kg)  12/04/22 236 lb 8 oz (107.3 kg)  10/23/22 231 lb 6.4 oz (105 kg)    Physical Exam Vitals and nursing note reviewed.  Constitutional:      General: He is not in acute distress.    Appearance: Normal appearance. He is not ill-appearing, toxic-appearing or diaphoretic.  HENT:     Head: Normocephalic and atraumatic.     Right Ear: External ear normal.     Left Ear: External ear normal.     Nose: Nose normal.     Mouth/Throat:     Mouth: Mucous membranes are moist.     Pharynx: Oropharynx is clear.  Eyes:     General: No scleral icterus.       Right eye: No discharge.        Left eye: No discharge.     Extraocular Movements: Extraocular movements intact.     Conjunctiva/sclera: Conjunctivae normal.     Pupils: Pupils are equal, round, and reactive to light.  Cardiovascular:     Rate and Rhythm: Normal rate and regular rhythm.     Pulses: Normal pulses.     Heart sounds: Normal heart sounds. No murmur heard.    No friction rub. No gallop.  Pulmonary:     Effort: Pulmonary effort is normal. No respiratory distress.     Breath sounds: Normal breath sounds. No stridor. No wheezing, rhonchi or rales.  Chest:     Chest wall: No tenderness.  Musculoskeletal:  General: Normal range of motion.     Cervical back: Normal range of motion and neck supple.  Skin:    General: Skin is warm and dry.     Capillary Refill: Capillary refill takes less than 2 seconds.     Coloration: Skin is not jaundiced or pale.     Findings: No bruising, erythema, lesion or rash.  Neurological:     General: No focal deficit present.     Mental Status: He is alert and oriented to person, place, and time. Mental status is at baseline.  Psychiatric:        Mood and Affect: Mood normal.        Behavior: Behavior normal.        Thought Content: Thought content normal.        Judgment:  Judgment normal.     Results for orders placed or performed in visit on 12/04/22  PSA   Collection Time: 12/04/22  9:30 AM  Result Value Ref Range   Prostate Specific Ag, Serum 5.5 (H) 0.0 - 4.0 ng/mL  Basic metabolic panel   Collection Time: 12/04/22  9:30 AM  Result Value Ref Range   Glucose 140 (H) 70 - 99 mg/dL   BUN 16 8 - 27 mg/dL   Creatinine, Ser 8.61 (H) 0.76 - 1.27 mg/dL   eGFR 54 (L) >40 fO/fpw/8.26   BUN/Creatinine Ratio 12 10 - 24   Sodium 142 134 - 144 mmol/L   Potassium 4.6 3.5 - 5.2 mmol/L   Chloride 104 96 - 106 mmol/L   CO2 23 20 - 29 mmol/L   Calcium  9.8 8.6 - 10.2 mg/dL      Assessment & Plan:   Problem List Items Addressed This Visit       Cardiovascular and Mediastinum   HTN (hypertension)   Still running a little high. Will increase his lisinopril  to 40mg  and recheck 3 months. Call with any concerns.       Relevant Medications   lisinopril  (ZESTRIL ) 40 MG tablet   Other Relevant Orders   Comprehensive metabolic panel     Endocrine   Controlled type 2 diabetes mellitus with complication, without long-term current use of insulin (HCC) - Primary   Doing great with A1c of 5.8. Continue current regimen. Continue to monitor. Call with any concerns.       Relevant Medications   lisinopril  (ZESTRIL ) 40 MG tablet   Other Relevant Orders   Bayer DCA Hb A1c Waived     Other   Hyperlipidemia   Under good control on current regimen. Continue current regimen. Continue to monitor. Call with any concerns. Refills up to date. Labs drawn today.        Relevant Medications   lisinopril  (ZESTRIL ) 40 MG tablet   Other Relevant Orders   Comprehensive metabolic panel   Lipid Panel w/o Chol/HDL Ratio     Follow up plan: Return in about 3 months (around 04/25/2023) for 6 month follow up.

## 2023-01-25 NOTE — Assessment & Plan Note (Signed)
 Under good control on current regimen. Continue current regimen. Continue to monitor. Call with any concerns. Refills up to date. Labs drawn today.

## 2023-01-25 NOTE — Assessment & Plan Note (Addendum)
Doing great with A1c of 5.8. Continue current regimen. Continue to monitor. Call with any concerns.  

## 2023-01-25 NOTE — Assessment & Plan Note (Signed)
 Still running a little high. Will increase his lisinopril to 40mg  and recheck 3 months. Call with any concerns.

## 2023-01-26 LAB — COMPREHENSIVE METABOLIC PANEL
ALT: 21 [IU]/L (ref 0–44)
AST: 21 [IU]/L (ref 0–40)
Albumin: 3.9 g/dL (ref 3.8–4.8)
Alkaline Phosphatase: 66 [IU]/L (ref 44–121)
BUN/Creatinine Ratio: 13 (ref 10–24)
BUN: 17 mg/dL (ref 8–27)
Bilirubin Total: 0.3 mg/dL (ref 0.0–1.2)
CO2: 20 mmol/L (ref 20–29)
Calcium: 8.8 mg/dL (ref 8.6–10.2)
Chloride: 106 mmol/L (ref 96–106)
Creatinine, Ser: 1.35 mg/dL — ABNORMAL HIGH (ref 0.76–1.27)
Globulin, Total: 2.2 g/dL (ref 1.5–4.5)
Glucose: 115 mg/dL — ABNORMAL HIGH (ref 70–99)
Potassium: 4.2 mmol/L (ref 3.5–5.2)
Sodium: 143 mmol/L (ref 134–144)
Total Protein: 6.1 g/dL (ref 6.0–8.5)
eGFR: 55 mL/min/{1.73_m2} — ABNORMAL LOW (ref >59)

## 2023-01-26 LAB — LIPID PANEL W/O CHOL/HDL RATIO
Cholesterol, Total: 119 mg/dL (ref 100–199)
HDL: 32 mg/dL — ABNORMAL LOW (ref >39)
LDL Chol Calc (NIH): 60 mg/dL (ref 0–99)
Triglycerides: 160 mg/dL — ABNORMAL HIGH (ref 0–149)
VLDL Cholesterol Cal: 27 mg/dL (ref 5–40)

## 2023-02-07 ENCOUNTER — Other Ambulatory Visit: Payer: Self-pay | Admitting: Family Medicine

## 2023-02-07 NOTE — Telephone Encounter (Signed)
Request too soon for refill.  Requested Prescriptions  Pending Prescriptions Disp Refills   levothyroxine (SYNTHROID) 75 MCG tablet [Pharmacy Med Name: Levothyroxine Sodium 75 MCG Oral Tablet] 100 tablet 2    Sig: TAKE 1 TABLET BY MOUTH DAILY  BEFORE BREAKFAST     Endocrinology:  Hypothyroid Agents Passed - 02/07/2023  1:32 PM      Passed - TSH in normal range and within 360 days    TSH  Date Value Ref Range Status  10/23/2022 1.180 0.450 - 4.500 uIU/mL Final         Passed - Valid encounter within last 12 months    Recent Outpatient Visits           1 week ago Controlled type 2 diabetes mellitus with complication, without long-term current use of insulin (HCC)   [place] [facility] [place], Megan P, DO   2 months ago Primary hypertension   [place] [facility] [place], Megan P, DO   2 months ago Controlled type 2 diabetes mellitus with complication, without long-term current use of insulin (HCC)   [place] [facility] [place], Megan P, DO   3 months ago Routine general medical examination at a health care facility   [facility] [place], Megan P, DO   9 months ago Encounter for [employer] annual wellness exam   [place] [facility] [place], Oralia Rud, DO       Future Appointments             In 2 months Laural Benes, Oralia Rud, DO [place] [facility]   In 5 months Johnson, Megan P, DO [place] [facility], [facility]             cyclobenzaprine (FLEXERIL) 10 MG tablet [Pharmacy Med Name: Cyclobenzaprine HCl 10 MG Oral Tablet] 30 tablet 0    Sig: TAKE 1 TABLET BY MOUTH AT  BEDTIME     Not Delegated - Analgesics:  Muscle Relaxants Failed - 02/07/2023  1:32 PM      Failed - This refill cannot be delegated      Passed - Valid encounter within last 6 months    Recent Outpatient Visits           1 week ago Controlled type 2 diabetes mellitus with complication,  without long-term current use of insulin (HCC)   [place] [facility] [place], Megan P, DO   2 months ago Primary hypertension   [place] [facility] [place], Megan P, DO   2 months ago Controlled type 2 diabetes mellitus with complication, without long-term current use of insulin (HCC)   [place] [facility] [place], Megan P, DO   3 months ago Routine general medical examination at a health care facility   [place], Megan P, DO   9 months ago Encounter for [employer] annual wellness exam   [place] [facility] [place], Oralia Rud, DO       Future Appointments             In 2 months Laural Benes, Oralia Rud, DO [place] [facility], [facility]   In 5 months Laural Benes, Oralia Rud, DO [place] [facility], [facility]             lisinopril (ZESTRIL) 40 MG tablet [Pharmacy Med Name: Lisinopril 40 MG Oral Tablet] 100 tablet 2    Sig: TAKE 1  TABLET BY MOUTH DAILY     Cardiovascular:  ACE Inhibitors Failed - 02/07/2023  1:32 PM      Failed - Cr in normal range and within 180 days    Creatinine, Ser  Date Value Ref Range Status  01/25/2023 1.35 (H) 0.76 - 1.27 mg/dL Final         Failed - Last BP in normal range    BP Readings from Last 1 Encounters:  01/25/23 (!) 154/80         Passed - K in normal range and within 180 days    Potassium  Date Value Ref Range Status  01/25/2023 4.2 3.5 - 5.2 mmol/L Final         Passed - Patient is not pregnant      Passed - Valid encounter within last 6 months    Recent Outpatient Visits           1 week ago Controlled type 2 diabetes mellitus with complication, without long-term current use of insulin (HCC)   [place] [facility] [place], Megan P, DO   2 months ago Primary hypertension   [place] [facility] [place], Megan P, DO   2 months ago Controlled type 2 diabetes mellitus with complication,  without long-term current use of insulin (HCC)   [place] [facility] [place], Megan P, DO   3 months ago Routine general medical examination at a health care facility   [place] [facility], Megan P, DO   9 months ago Encounter for [employer] annual wellness exam   [place] [facility] [place], [place], DO       Future Appointments             In 2 months Laural Benes, Oralia Rud, DO [place] [facility], [facility]   In 5 months Laural Benes, Megan P, DO [place] [facility], [facility]             allopurinol (ZYLOPRIM) 100 MG tablet [Pharmacy Med Name: Allopurinol 100 MG Oral Tablet] 100 tablet 2    Sig: TAKE 1 TABLET BY MOUTH DAILY     Endocrinology:  Gout Agents - allopurinol Failed - 02/07/2023  1:32 PM      Failed - Cr in normal range and within 360 days    Creatinine, Ser  Date Value Ref Range Status  01/25/2023 1.35 (H) 0.76 - 1.27 mg/dL Final         Passed - Uric Acid in normal range and within 360 days    Uric Acid  Date Value Ref Range Status  10/23/2022 6.4 3.8 - 8.4 mg/dL Final    Comment:               Therapeutic target for gout patients: <6.0         Passed - Valid encounter within last 12 months    Recent Outpatient Visits           1 week ago Controlled type 2 diabetes mellitus with complication, without long-term current use of insulin (HCC)   [place] [facility] [place], Megan P, DO   2 months ago Primary hypertension   [place] [facility] [place], Megan P, DO   2 months ago Controlled type 2 diabetes mellitus with complication, without long-term current use of insulin [facility])    [facility] [place], Megan P, DO   3 months ago Routine general medical examination  at a health care facility   [facility] [place], Megan P, DO   9 months ago Encounter for [employer] annual wellness exam   [place] [facility] [place], Oralia Rud, DO       Future Appointments             In 2 months Laural Benes, Oralia Rud, DO [place] [facility]   In 5 months Laural Benes, Oralia Rud, DO [place] [facility], [facility]            Passed - CBC within normal limits and completed in the last 12 months    WBC  Date Value Ref Range Status  10/23/2022 11.8 (H) 3.4 - 10.8 x10E3/uL Final   RBC  Date Value Ref Range Status  10/23/2022 5.56 4.14 - 5.80 x10E6/uL Final   Hemoglobin  Date Value Ref Range Status  10/23/2022 16.2 13.0 - 17.7 g/dL Final   Hematocrit  Date Value Ref Range Status  10/23/2022 48.8 37.5 - 51.0 % Final   MCHC  Date Value Ref Range Status  10/23/2022 33.2 31.5 - 35.7 g/dL Final   [facility]  Date Value Ref Range Status  10/23/2022 29.1 26.6 - 33.0 pg Final   MCV  Date Value Ref Range Status  10/23/2022 88 79 - 97 fL Final   No results found for: "PLTCOUNTKUC", "LABPLAT", "POCPLA" RDW  Date Value Ref Range Status  10/23/2022 13.1 11.6 - 15.4 % Final          omeprazole (PRILOSEC) 40 MG capsule [Pharmacy Med Name: Omeprazole 40 MG Oral Capsule Delayed Release] 100 capsule 2    Sig: TAKE 1 CAPSULE BY MOUTH DAILY     Gastroenterology: Proton Pump Inhibitors Passed - 02/07/2023  1:32 PM      Passed - Valid encounter within last 12 months    Recent Outpatient Visits           1 week ago Controlled type 2 diabetes mellitus with complication, without long-term current use of insulin (HCC)   [place] [facility] [place], Megan P, DO   2 months ago Primary hypertension   [place] [facility] [place], Megan P, DO   2 months ago Controlled type 2 diabetes mellitus with complication, without long-term current use of insulin (HCC)   [facility] [place], Megan P, DO   3 months ago Routine general medical examination at a health care facility   [facility] [place], Megan P,  DO   9 months ago Encounter for [employer] annual wellness exam   [place] [facility] [place], Oralia Rud, DO       Future Appointments             In 2 months Laural Benes, Oralia Rud, DO  [facility]   In 5 months Laural Benes, Oralia Rud, DO  [facility], [facility]

## 2023-03-21 DIAGNOSIS — Z043 Encounter for examination and observation following other accident: Secondary | ICD-10-CM | POA: Diagnosis not present

## 2023-03-28 ENCOUNTER — Other Ambulatory Visit: Payer: Self-pay | Admitting: Family Medicine

## 2023-03-29 NOTE — Telephone Encounter (Signed)
 Requested Prescriptions  Pending Prescriptions Disp Refills   omeprazole (PRILOSEC) 40 MG capsule [Pharmacy Med Name: Omeprazole 40 MG Oral Capsule Delayed Release] 100 capsule 1    Sig: TAKE 1 CAPSULE BY MOUTH DAILY     Gastroenterology: Proton Pump Inhibitors Passed - 03/29/2023 12:03 PM      Passed - Valid encounter within last 12 months    Recent Outpatient Visits           2 months ago Controlled type 2 diabetes mellitus with complication, without long-term current use of insulin (HCC)   [place] [facility] [place], Megan P, DO   3 months ago Primary hypertension   [place] [facility] [place], Megan P, DO   4 months ago Controlled type 2 diabetes mellitus with complication, without long-term current use of insulin (HCC)   [place] [facility] [place], Megan P, DO   5 months ago Routine general medical examination at a health care facility   [facility] [place], Megan P, DO   11 months ago Encounter for [employer] annual wellness exam   [facility] [place], Oralia Rud, DO       Future Appointments             In 3 weeks Laural Benes, Oralia Rud, DO [place] [facility], [facility]   In 3 months Laural Benes, Oralia Rud, DO  [facility], [facility]

## 2023-04-04 ENCOUNTER — Other Ambulatory Visit: Payer: Self-pay | Admitting: Family Medicine

## 2023-04-05 NOTE — Telephone Encounter (Signed)
 Requested Prescriptions  Pending Prescriptions Disp Refills   lisinopril (ZESTRIL) 40 MG tablet [Pharmacy Med Name: Lisinopril 40 MG Oral Tablet] 100 tablet 0    Sig: TAKE 1 TABLET BY MOUTH DAILY     Cardiovascular:  ACE Inhibitors Failed - 04/05/2023 10:33 AM      Failed - Cr in normal range and within 180 days    Creatinine, Ser  Date Value Ref Range Status  01/25/2023 1.35 (H) 0.76 - 1.27 mg/dL Final         Failed - Last BP in normal range    BP Readings from Last 1 Encounters:  01/25/23 (!) 154/80         Passed - K in normal range and within 180 days    Potassium  Date Value Ref Range Status  01/25/2023 4.2 3.5 - 5.2 mmol/L Final         Passed - Patient is not pregnant      Passed - Valid encounter within last 6 months    Recent Outpatient Visits           2 months ago Controlled type 2 diabetes mellitus with complication, without long-term current use of insulin (HCC)   [place] [facility] [place], Megan P, DO   4 months ago Primary hypertension   [place] [facility] [place], Megan P, DO   4 months ago Controlled type 2 diabetes mellitus with complication, without long-term current use of insulin (HCC)   [place] [facility] [place], Megan P, DO   5 months ago Routine general medical examination at a health care facility   [facility] [place], Megan P, DO   11 months ago Encounter for [employer] annual wellness exam   [place] [facility] [place], Oralia Rud, DO       Future Appointments             In 2 weeks Laural Benes, Oralia Rud, DO [place] [facility]   In 3 months Laural Benes, Oralia Rud, DO [place] [facility], [facility]

## 2023-04-11 ENCOUNTER — Other Ambulatory Visit: Payer: Self-pay | Admitting: Family Medicine

## 2023-04-12 NOTE — Telephone Encounter (Signed)
 Requested Prescriptions  Pending Prescriptions Disp Refills   allopurinol (ZYLOPRIM) 100 MG tablet [Pharmacy Med Name: Allopurinol 100 MG Oral Tablet] 100 tablet 1    Sig: TAKE 1 TABLET BY MOUTH DAILY     Endocrinology:  Gout Agents - allopurinol Failed - 04/12/2023  1:39 PM      Failed - Cr in normal range and within 360 days    Creatinine, Ser  Date Value Ref Range Status  01/25/2023 1.35 (H) 0.76 - 1.27 mg/dL Final         Failed - Valid encounter within last 12 months    Recent Outpatient Visits   None     Future Appointments             In 1 week Johnson, Megan P, DO [place] [facility]   In 3 months Laural Benes, Megan P, DO [place] [facility], [facility]            Passed - Uric Acid in normal range and within 360 days    Uric Acid  Date Value Ref Range Status  10/23/2022 6.4 3.8 - 8.4 mg/dL Final    Comment:               Therapeutic target for gout patients: <6.0         Passed - CBC within normal limits and completed in the last 12 months    WBC  Date Value Ref Range Status  10/23/2022 11.8 (H) 3.4 - 10.8 x10E3/uL Final   RBC  Date Value Ref Range Status  10/23/2022 5.56 4.14 - 5.80 x10E6/uL Final   Hemoglobin  Date Value Ref Range Status  10/23/2022 16.2 13.0 - 17.7 g/dL Final   Hematocrit  Date Value Ref Range Status  10/23/2022 48.8 37.5 - 51.0 % Final   MCHC  Date Value Ref Range Status  10/23/2022 33.2 31.5 - 35.7 g/dL Final   [facility]  Date Value Ref Range Status  10/23/2022 29.1 26.6 - 33.0 pg Final   MCV  Date Value Ref Range Status  10/23/2022 88 79 - 97 fL Final   No results found for: "PLTCOUNTKUC", "LABPLAT", "POCPLA" RDW  Date Value Ref Range Status  10/23/2022 13.1 11.6 - 15.4 % Final

## 2023-04-25 ENCOUNTER — Encounter: Payer: Self-pay | Admitting: Family Medicine

## 2023-04-25 ENCOUNTER — Ambulatory Visit: Payer: Self-pay | Admitting: Family Medicine

## 2023-04-25 VITALS — BP 148/70 | HR 58 | Temp 98.1°F | Ht 75.0 in | Wt 234.8 lb

## 2023-04-25 DIAGNOSIS — I10 Essential (primary) hypertension: Secondary | ICD-10-CM | POA: Diagnosis not present

## 2023-04-25 DIAGNOSIS — F339 Major depressive disorder, recurrent, unspecified: Secondary | ICD-10-CM | POA: Diagnosis not present

## 2023-04-25 DIAGNOSIS — E118 Type 2 diabetes mellitus with unspecified complications: Secondary | ICD-10-CM

## 2023-04-25 LAB — BAYER DCA HB A1C WAIVED: HB A1C (BAYER DCA - WAIVED): 5.4 % (ref 4.8–5.6)

## 2023-04-25 MED ORDER — FLUOXETINE HCL 10 MG PO CAPS: ORAL_CAPSULE | ORAL | 0 refills | Status: DC

## 2023-04-25 NOTE — Progress Notes (Unsigned)
 BP (!) 172/97 (BP Location: Left Arm, Patient Position: Sitting, Cuff Size: Large)   Pulse (!) 58   Temp 98.1 F (36.7 C) (Oral)   Ht 6\' 3"  (1.905 m)   Wt 234 lb 12.8 oz (106.5 kg)   SpO2 96%   BMI 29.35 kg/m    Subjective:    Patient ID: Kenneth Johnston, male    DOB: 12/13/1950, 73 y.o.   MRN: [id number]  HPI: Kenneth Johnston is a 73 y.o. male  Chief Complaint  Patient presents with   Diabetes   Hypertension   DIABETES Hypoglycemic episodes:no Polydipsia/polyuria: no Visual disturbance: no Chest pain: no Paresthesias: no Glucose Monitoring: no  Accucheck frequency: Not Checking Taking Insulin?: no Blood Pressure Monitoring: not checking Retinal Examination: Up to Date Foot Exam: Up to Date Diabetic Education: Completed Pneumovax: Not up to Date Influenza: Not up to Date Aspirin: {Blank single:19197::"yes","no"}  HYPERTENSION  Hypertension status: {Blank single:19197::"controlled","uncontrolled","better","worse","exacerbated","stable"}  Satisfied with current treatment? {Blank single:19197::"yes","no"} Duration of hypertension: {Blank single:19197::"chronic","months","years"} BP monitoring frequency:  {Blank single:19197::"not checking","rarely","daily","weekly","monthly","a few times a day","a few times a week","a few times a month"} BP range:  BP medication side effects:  {Blank single:19197::"yes","no"} Medication compliance: {Blank single:19197::"excellent compliance","good compliance","fair compliance","poor compliance"} Previous BP meds:{Blank multiple:19196::"none","amlodipine","amlodipine/benazepril","atenolol","benazepril","benazepril/HCTZ","bisoprolol (bystolic)","carvedilol","chlorthalidone","clonidine","diltiazem","exforge HCT","HCTZ","irbesartan (avapro)","labetalol","lisinopril","lisinopril-HCTZ","losartan (cozaar)","methyldopa","nifedipine","olmesartan  (benicar)","olmesartan-HCTZ","quinapril","ramipril","spironalactone","tekturna","valsartan","valsartan-HCTZ","verapamil"} Aspirin: {Blank single:19197::"yes","no"} Recurrent headaches: {Blank single:19197::"yes","no"} Visual changes: {Blank single:19197::"yes","no"} Palpitations: {Blank single:19197::"yes","no"} Dyspnea: {Blank single:19197::"yes","no"} Chest pain: {Blank single:19197::"yes","no"} Lower extremity edema: {Blank single:19197::"yes","no"} Dizzy/lightheaded: {Blank single:19197::"yes","no"}  DEPRESSION Mood status: {Blank single:19197::"controlled","uncontrolled","better","worse","exacerbated","stable"} Satisfied with current treatment?: {Blank single:19197::"yes","no"} Symptom severity: {Blank single:19197::"mild","moderate","severe"}  Duration of current treatment : {Blank single:19197::"chronic","months","years"} Side effects: {Blank single:19197::"yes","no"} Medication compliance: {Blank single:19197::"excellent compliance","good compliance","fair compliance","poor compliance"} Psychotherapy/counseling: {Blank single:19197::"yes","no"} {Blank single:19197::"current","in the past"} Previous psychiatric medications: {Blank multiple:19196::"abilify","amitryptiline","buspar","celexa","cymbalta","depakote","effexor","lamictal","lexapro","lithium","nortryptiline","paxil","prozac","pristiq (desvenlafaxine","seroquel","wellbutrin","zoloft","zyprexa"} Depressed mood: {Blank single:19197::"yes","no"} Anxious mood: {Blank single:19197::"yes","no"} Anhedonia: {Blank single:19197::"yes","no"} Significant weight loss or gain: {Blank single:19197::"yes","no"} Insomnia: {Blank single:19197::"yes","no"} {Blank single:19197::"hard to fall asleep","hard to stay asleep"} Fatigue: {Blank single:19197::"yes","no"} Feelings of worthlessness or guilt: {Blank single:19197::"yes","no"} Impaired concentration/indecisiveness: {Blank single:19197::"yes","no"} Suicidal ideations: {Blank  single:19197::"yes","no"} Hopelessness: {Blank single:19197::"yes","no"} Crying spells: {Blank single:19197::"yes","no"}    04/25/2023   11:15 AM 12/04/2022    9:16 AM 10/23/2022   10:08 AM 04/21/2022   10:24 AM 10/12/2021    9:39 AM  Depression screen PHQ 2/9  Decreased Interest 0 0 0 0 0  Down, Depressed, Hopeless 0 0 0 0 0  PHQ - 2 Score 0 0 0 0 0  Altered sleeping 0 0 0 0 0  Tired, decreased energy 0 0 3 0 3  Change in appetite 0 0 0 0 0  Feeling bad or failure about yourself  0 0 0 0 0  Trouble concentrating 0 0 0 0 0  Moving slowly or fidgety/restless 0 0 0 0 0  Suicidal thoughts 0 0 0 0 0  PHQ-9 Score 0 0 3 0 3  Difficult doing work/chores  Not difficult at all Extremely dIfficult Not difficult at all Not difficult at all     Relevant past medical, surgical, family and social history reviewed and updated as indicated. Interim medical history since our last visit reviewed. Allergies and medications reviewed and updated.  Review of Systems  Constitutional: Negative.   Respiratory: Negative.    Cardiovascular: Negative.   Musculoskeletal: Negative.   Neurological: Negative.   Psychiatric/Behavioral: Negative.      Per HPI unless specifically indicated above     Objective:    BP (!) 172/97 (BP Location: Left Arm, Patient Position: Sitting, Cuff Size: Large)  Pulse (!) 58   Temp 98.1 F (36.7 C) (Oral)   Ht 6\' 3"  (1.905 m)   Wt 234 lb 12.8 oz (106.5 kg)   SpO2 96%   BMI 29.35 kg/m   Wt Readings from Last 3 Encounters:  04/25/23 234 lb 12.8 oz (106.5 kg)  01/25/23 236 lb 6.4 oz (107.2 kg)  12/04/22 236 lb 8 oz (107.3 kg)    Physical Exam Vitals and nursing note reviewed.  Constitutional:      General: He is not in acute distress.    Appearance: Normal appearance. He is not ill-appearing, toxic-appearing or diaphoretic.  HENT:     Head: Normocephalic and atraumatic.     Right Ear: External ear normal.     Left Ear: External ear normal.     Nose: Nose  normal.     Mouth/Throat:     Mouth: Mucous membranes are moist.     Pharynx: Oropharynx is clear.  Eyes:     General: No scleral icterus.       Right eye: No discharge.        Left eye: No discharge.     Extraocular Movements: Extraocular movements intact.     Conjunctiva/sclera: Conjunctivae normal.     Pupils: Pupils are equal, round, and reactive to light.  Cardiovascular:     Rate and Rhythm: Normal rate and regular rhythm.     Pulses: Normal pulses.     Heart sounds: Normal heart sounds. No murmur heard.    No friction rub. No gallop.  Pulmonary:     Effort: Pulmonary effort is normal. No respiratory distress.     Breath sounds: Normal breath sounds. No stridor. No wheezing, rhonchi or rales.  Chest:     Chest wall: No tenderness.  Musculoskeletal:        General: Normal range of motion.     Cervical back: Normal range of motion and neck supple.  Skin:    General: Skin is warm and dry.     Capillary Refill: Capillary refill takes less than 2 seconds.     Coloration: Skin is not jaundiced or pale.     Findings: No bruising, erythema, lesion or rash.  Neurological:     General: No focal deficit present.     Mental Status: He is alert and oriented to person, place, and time. Mental status is at baseline.  Psychiatric:        Mood and Affect: Mood normal.        Behavior: Behavior normal.        Thought Content: Thought content normal.        Judgment: Judgment normal.     Results for orders placed or performed in visit on 01/25/23  Bayer DCA Hb A1c Waived   Collection Time: 01/25/23  1:20 PM  Result Value Ref Range   HB A1C (BAYER DCA - WAIVED) 5.8 (H) 4.8 - 5.6 %  Comprehensive metabolic panel   Collection Time: 01/25/23  1:20 PM  Result Value Ref Range   Glucose 115 (H) 70 - 99 mg/dL   BUN 17 8 - 27 mg/dL   Creatinine, Ser 1.61 (H) 0.76 - 1.27 mg/dL   eGFR 55 (L) >09 UE/AVW/0.98   BUN/Creatinine Ratio 13 10 - 24   Sodium 143 134 - 144 mmol/L   Potassium 4.2  3.5 - 5.2 mmol/L   Chloride 106 96 - 106 mmol/L   CO2 20 20 - 29 mmol/L   Calcium 8.8 8.6 -  10.2 mg/dL   Total Protein 6.1 6.0 - 8.5 g/dL   Albumin 3.9 3.8 - 4.8 g/dL   Globulin, Total 2.2 1.5 - 4.5 g/dL   Bilirubin Total 0.3 0.0 - 1.2 mg/dL   Alkaline Phosphatase 66 44 - 121 IU/L   AST 21 0 - 40 IU/L   ALT 21 0 - 44 IU/L  Lipid Panel w/o Chol/HDL Ratio   Collection Time: 01/25/23  1:20 PM  Result Value Ref Range   Cholesterol, Total 119 100 - 199 mg/dL   Triglycerides 161 (H) 0 - 149 mg/dL   HDL 32 (L) >09 mg/dL   VLDL Cholesterol Cal 27 5 - 40 mg/dL   LDL Chol Calc (NIH) 60 0 - 99 mg/dL      Assessment & Plan:   Problem List Items Addressed This Visit       Endocrine   Controlled type 2 diabetes mellitus with complication, without long-term current use of insulin (HCC) - Primary   Relevant Orders   Bayer DCA Hb A1c Waived     Follow up plan: No follow-ups on file.

## 2023-04-25 NOTE — Assessment & Plan Note (Signed)
 Did not take his medicine today. Encouraged him to take his medicine daily- recheck in about 6 weeks.

## 2023-04-25 NOTE — Assessment & Plan Note (Signed)
 Doing great with A1c of 5.4! Will stop metformin. Call with any concerns. Recheck 3 months.

## 2023-04-26 NOTE — Assessment & Plan Note (Signed)
 Having issues with motivation- will get him started on low dose prozac. Recheck in about 6 weeks. Call with any concerns.

## 2023-04-28 ENCOUNTER — Other Ambulatory Visit: Payer: Self-pay | Admitting: Family Medicine

## 2023-04-30 NOTE — Telephone Encounter (Signed)
 Requested Prescriptions  Pending Prescriptions Disp Refills   levothyroxine (SYNTHROID) 75 MCG tablet [Pharmacy Med Name: Levothyroxine Sodium 75 MCG Oral Tablet] 100 tablet 1    Sig: TAKE 1 TABLET BY MOUTH DAILY  BEFORE BREAKFAST     Endocrinology:  Hypothyroid Agents Passed - 04/30/2023  3:32 PM      Passed - TSH in normal range and within 360 days    TSH  Date Value Ref Range Status  10/23/2022 1.180 0.450 - 4.500 uIU/mL Final         Passed - Valid encounter within last 12 months    Recent Outpatient Visits           5 days ago Controlled type 2 diabetes mellitus with complication, without long-term current use of insulin (HCC)   [place] [facility] [place], [place], DO       Future Appointments             In 2 months Johnson, Megan P, DO [place] [facility], [facility]             meloxicam (MOBIC) 15 MG tablet [Pharmacy Med Name: Meloxicam 15 MG Oral Tablet] 100 tablet 1    Sig: TAKE 1 TABLET BY MOUTH DAILY     Analgesics:  COX2 Inhibitors Failed - 04/30/2023  3:32 PM      Failed - Manual Review: Labs are only required if the patient has taken medication for more than 8 weeks.      Failed - Cr in normal range and within 360 days    Creatinine, Ser  Date Value Ref Range Status  01/25/2023 1.35 (H) 0.76 - 1.27 mg/dL Final         Passed - HGB in normal range and within 360 days    Hemoglobin  Date Value Ref Range Status  10/23/2022 16.2 13.0 - 17.7 g/dL Final         Passed - HCT in normal range and within 360 days    Hematocrit  Date Value Ref Range Status  10/23/2022 48.8 37.5 - 51.0 % Final         Passed - AST in normal range and within 360 days    AST  Date Value Ref Range Status  01/25/2023 21 0 - 40 IU/L Final         Passed - ALT in normal range and within 360 days    ALT  Date Value Ref Range Status  01/25/2023 21 0 - 44 IU/L Final         Passed - eGFR is 30 or above and within 360 days    GFR calc Af Amer   Date Value Ref Range Status  10/09/2019 64 >59 mL/min/1.73 Final    Comment:    **[facility] currently reports eGFR in compliance with the current**   recommendations of the [employer]. [facility] will   update reporting as new guidelines are published from the NKF-ASN   Task force.    GFR calc non Af Amer  Date Value Ref Range Status  10/09/2019 55 (L) >59 mL/min/1.73 Final   eGFR  Date Value Ref Range Status  01/25/2023 55 (L) >59 mL/min/1.73 Final         Passed - Patient is not pregnant      Passed - Valid encounter within last 12 months    Recent Outpatient Visits           5 days ago Controlled type  2 diabetes mellitus with complication, without long-term current use of insulin [facility])   [place] [facility] [place], Jerilee Montane, DO       Future Appointments             In 2 months [place] Renshaw, Jerilee Montane, DO [place], [facility]

## 2023-05-14 ENCOUNTER — Other Ambulatory Visit: Payer: Self-pay | Admitting: Family Medicine

## 2023-05-17 NOTE — Telephone Encounter (Signed)
 Too soon for refill for lisinopril ,mlast RF 04/05/23 for 90 days.  Metformin  discontinued.  Requested Prescriptions  Pending Prescriptions Disp Refills   lisinopril  (ZESTRIL ) 40 MG tablet [Pharmacy Med Name: Lisinopril  40 MG Oral Tablet] 100 tablet 2    Sig: TAKE 1 TABLET BY MOUTH DAILY     Cardiovascular:  ACE Inhibitors Failed - 05/17/2023  9:42 AM      Failed - Cr in normal range and within 180 days    Creatinine, Ser  Date Value Ref Range Status  01/25/2023 1.35 (H) 0.76 - 1.27 mg/dL Final         Failed - Last BP in normal range    BP Readings from Last 1 Encounters:  04/25/23 (!) 148/70         Passed - K in normal range and within 180 days    Potassium  Date Value Ref Range Status  01/25/2023 4.2 3.5 - 5.2 mmol/L Final         Passed - Patient is not pregnant      Passed - Valid encounter within last 6 months    Recent Outpatient Visits           3 weeks ago Controlled type 2 diabetes mellitus with complication, without long-term current use of insulin (HCC)   [place] [facility] [place], [place], DO       Future Appointments             In 2 months Johnson, Megan P, DO [place] [facility], [facility]             metFORMIN  (GLUCOPHAGE ) 500 MG tablet [Pharmacy Med Name: metFORMIN  HCl 500 MG Oral Tablet] 100 tablet 2    Sig: TAKE 1 TABLET BY MOUTH DAILY     Endocrinology:  Diabetes - Biguanides Failed - 05/17/2023  9:42 AM      Failed - Cr in normal range and within 360 days    Creatinine, Ser  Date Value Ref Range Status  01/25/2023 1.35 (H) 0.76 - 1.27 mg/dL Final         Failed - eGFR in normal range and within 360 days    GFR calc Af Amer  Date Value Ref Range Status  10/09/2019 64 >59 mL/min/1.73 Final    Comment:    **[facility] currently reports eGFR in compliance with the current**   recommendations of the [employer]. [facility] will   update reporting as new guidelines are published from the NKF-ASN    Task force.    GFR calc non Af Amer  Date Value Ref Range Status  10/09/2019 55 (L) >59 mL/min/1.73 Final   eGFR  Date Value Ref Range Status  01/25/2023 55 (L) >59 mL/min/1.73 Final         Failed - B12 Level in normal range and within 720 days    No results found for: "VITAMINB12"       Passed - HBA1C is between 0 and 7.9 and within 180 days    HB A1C (BAYER DCA - WAIVED)  Date Value Ref Range Status  04/25/2023 5.4 4.8 - 5.6 % Final    Comment:             Prediabetes: 5.7 - 6.4          Diabetes: >6.4          Glycemic control for adults with diabetes: <7.0          Passed - Valid  encounter within last 6 months    Recent Outpatient Visits           3 weeks ago Controlled type 2 diabetes mellitus with complication, without long-term current use of insulin (HCC)   [place] [facility] [place], [place], DO       Future Appointments             In 2 months Johnson, Jerilee Montane, DO [place] [facility]            Passed - CBC within normal limits and completed in the last 12 months    WBC  Date Value Ref Range Status  10/23/2022 11.8 (H) 3.4 - 10.8 x10E3/uL Final   RBC  Date Value Ref Range Status  10/23/2022 5.56 4.14 - 5.80 x10E6/uL Final   Hemoglobin  Date Value Ref Range Status  10/23/2022 16.2 13.0 - 17.7 g/dL Final   Hematocrit  Date Value Ref Range Status  10/23/2022 48.8 37.5 - 51.0 % Final   MCHC  Date Value Ref Range Status  10/23/2022 33.2 31.5 - 35.7 g/dL Final   [facility]  Date Value Ref Range Status  10/23/2022 29.1 26.6 - 33.0 pg Final   MCV  Date Value Ref Range Status  10/23/2022 88 79 - 97 fL Final   No results found for: "PLTCOUNTKUC", "LABPLAT", "POCPLA" RDW  Date Value Ref Range Status  10/23/2022 13.1 11.6 - 15.4 % Final

## 2023-05-19 ENCOUNTER — Other Ambulatory Visit: Payer: Self-pay | Admitting: Family Medicine

## 2023-05-22 NOTE — Telephone Encounter (Signed)
 Requested Prescriptions  Refused Prescriptions Disp Refills   rosuvastatin  (CRESTOR ) 5 MG tablet [Pharmacy Med Name: Rosuvastatin  Calcium  5 MG Oral Tablet] 100 tablet 2    Sig: TAKE 1 TABLET BY MOUTH DAILY AS  DIRECTED     Cardiovascular:  Antilipid - Statins 2 Failed - 05/22/2023 12:04 PM      Failed - Cr in normal range and within 360 days    Creatinine, Ser  Date Value Ref Range Status  01/25/2023 1.35 (H) 0.76 - 1.27 mg/dL Final         Failed - Lipid Panel in normal range within the last 12 months    Cholesterol, Total  Date Value Ref Range Status  01/25/2023 119 100 - 199 mg/dL Final   LDL Chol Calc (NIH)  Date Value Ref Range Status  01/25/2023 60 0 - 99 mg/dL Final   HDL  Date Value Ref Range Status  01/25/2023 32 (L) >39 mg/dL Final   Triglycerides  Date Value Ref Range Status  01/25/2023 160 (H) 0 - 149 mg/dL Final         Passed - Patient is not pregnant      Passed - Valid encounter within last 12 months    Recent Outpatient Visits           3 weeks ago Controlled type 2 diabetes mellitus with complication, without long-term current use of insulin (HCC)   [place] [facility] [place], Jerilee Montane, DO       Future Appointments             In 2 months Solomon Dupre, DO [place] [facility], [facility]

## 2023-06-12 DIAGNOSIS — M9901 Segmental and somatic dysfunction of cervical region: Secondary | ICD-10-CM | POA: Diagnosis not present

## 2023-06-12 DIAGNOSIS — M545 Low back pain, unspecified: Secondary | ICD-10-CM | POA: Diagnosis not present

## 2023-06-12 DIAGNOSIS — M62830 Muscle spasm of back: Secondary | ICD-10-CM | POA: Diagnosis not present

## 2023-06-13 DIAGNOSIS — M545 Low back pain, unspecified: Secondary | ICD-10-CM | POA: Diagnosis not present

## 2023-06-13 DIAGNOSIS — M9901 Segmental and somatic dysfunction of cervical region: Secondary | ICD-10-CM | POA: Diagnosis not present

## 2023-06-14 ENCOUNTER — Ambulatory Visit (INDEPENDENT_AMBULATORY_CARE_PROVIDER_SITE_OTHER): Admitting: Family Medicine

## 2023-06-14 ENCOUNTER — Encounter: Payer: Self-pay | Admitting: Family Medicine

## 2023-06-14 VITALS — BP 128/85 | HR 67 | Ht 75.0 in | Wt 229.8 lb

## 2023-06-14 DIAGNOSIS — F339 Major depressive disorder, recurrent, unspecified: Secondary | ICD-10-CM | POA: Diagnosis not present

## 2023-06-14 DIAGNOSIS — I10 Essential (primary) hypertension: Secondary | ICD-10-CM

## 2023-06-14 MED ORDER — LISINOPRIL 40 MG PO TABS: 40.0000 mg | ORAL_TABLET | Freq: Every day | ORAL | 1 refills | Status: DC

## 2023-06-14 MED ORDER — FLUOXETINE HCL 10 MG PO CAPS: 10.0000 mg | ORAL_CAPSULE | Freq: Every day | ORAL | 1 refills | Status: DC

## 2023-06-14 NOTE — Progress Notes (Signed)
 BP 128/85 (BP Location: Left Arm, Patient Position: Sitting)   Pulse 67   Ht 6\' 3"  (1.905 m)   Wt 229 lb 12.8 oz (104.2 kg)   SpO2 98%   BMI 28.72 kg/m    Subjective:    Patient ID: Kenneth Johnston, male    DOB: [birth year], 73 y.o.   MRN: [id number]  HPI: Kenneth Johnston is a 73 y.o. male  Chief Complaint  Patient presents with   Hypertension   Depression   HYPERTENSION  Hypertension status: controlled  Satisfied with current treatment? yes Duration of hypertension: chronic BP monitoring frequency:  rarely BP medication side effects:  no Medication compliance: excellent compliance Previous BP meds: lisinopril  Aspirin : yes Recurrent headaches: no Visual changes: no Palpitations: no Dyspnea: no Chest pain: no Lower extremity edema: no Dizzy/lightheaded: no  DEPRESSION Mood status: controlled Satisfied with current treatment?: yes Symptom severity: mild  Duration of current treatment : chronic Side effects: no Medication compliance: excellent compliance Psychotherapy/counseling: no  Previous psychiatric medications: prozac  Depressed mood: no Anxious mood: no Anhedonia: no Significant weight loss or gain: no Insomnia: no  Fatigue: no Feelings of worthlessness or guilt: no Impaired concentration/indecisiveness: no Suicidal ideations: no Hopelessness: no Crying spells: no    06/14/2023   11:00 AM 04/25/2023   11:15 AM 12/04/2022    9:16 AM 10/23/2022   10:08 AM 04/21/2022   10:24 AM  Depression screen PHQ 2/9  Decreased Interest 0 0 0 0 0  Down, Depressed, Hopeless 0 0 0 0 0  PHQ - 2 Score 0 0 0 0 0  Altered sleeping 0 0 0 0 0  Tired, decreased energy 0 0 0 3 0  Change in appetite 0 0 0 0 0  Feeling bad or failure about yourself  0 0 0 0 0  Trouble concentrating 0 0 0 0 0  Moving slowly or fidgety/restless 0 0 0 0 0  Suicidal thoughts 0 0 0 0 0  PHQ-9 Score 0 0 0 3 0  Difficult doing work/chores   Not difficult at all Extremely dIfficult Not  difficult at all    Relevant past medical, surgical, family and social history reviewed and updated as indicated. Interim medical history since our last visit reviewed. Allergies and medications reviewed and updated.  Review of Systems  Constitutional: Negative.   Respiratory: Negative.    Cardiovascular: Negative.   Musculoskeletal: Negative.   Neurological: Negative.   Psychiatric/Behavioral: Negative.      Per HPI unless specifically indicated above     Objective:     BP 128/85 (BP Location: Left Arm, Patient Position: Sitting)   Pulse 67   Ht 6\' 3"  (1.905 m)   Wt 229 lb 12.8 oz (104.2 kg)   SpO2 98%   BMI 28.72 kg/m   Wt Readings from Last 3 Encounters:  06/14/23 229 lb 12.8 oz (104.2 kg)  04/25/23 234 lb 12.8 oz (106.5 kg)  01/25/23 236 lb 6.4 oz (107.2 kg)    Physical Exam Vitals and nursing note reviewed.  Constitutional:      General: He is not in acute distress.    Appearance: Normal appearance. He is not ill-appearing, toxic-appearing or diaphoretic.  HENT:     Head: Normocephalic and atraumatic.     Right Ear: External ear normal.     Left Ear: External ear normal.     Nose: Nose normal.     Mouth/Throat:     Mouth: Mucous membranes are moist.  Pharynx: Oropharynx is clear.  Eyes:     General: No scleral icterus.       Right eye: No discharge.        Left eye: No discharge.     Extraocular Movements: Extraocular movements intact.     Conjunctiva/sclera: Conjunctivae normal.     Pupils: Pupils are equal, round, and reactive to light.  Cardiovascular:     Rate and Rhythm: Normal rate and regular rhythm.     Pulses: Normal pulses.     Heart sounds: Normal heart sounds. No murmur heard.    No friction rub. No gallop.  Pulmonary:     Effort: Pulmonary effort is normal. No respiratory distress.     Breath sounds: Normal breath sounds. No stridor. No wheezing, rhonchi or rales.  Chest:     Chest wall: No tenderness.  Musculoskeletal:         General: Normal range of motion.     Cervical back: Normal range of motion and neck supple.  Skin:    General: Skin is warm and dry.     Capillary Refill: Capillary refill takes less than 2 seconds.     Coloration: Skin is not jaundiced or pale.     Findings: No bruising, erythema, lesion or rash.  Neurological:     General: No focal deficit present.     Mental Status: He is alert and oriented to person, place, and time. Mental status is at baseline.  Psychiatric:        Mood and Affect: Mood normal.        Behavior: Behavior normal.        Thought Content: Thought content normal.        Judgment: Judgment normal.     Results for orders placed or performed in visit on 04/25/23  Bayer DCA Hb A1c Waived   Collection Time: 04/25/23 11:27 AM  Result Value Ref Range   HB A1C (BAYER DCA - WAIVED) 5.4 4.8 - 5.6 %      Assessment & Plan:   Problem List Items Addressed This Visit       Cardiovascular and Mediastinum   HTN (hypertension) - Primary   Under good control on current regimen. Continue current regimen. Continue to monitor. Call with any concerns. Refills given. Labs drawn today.        Relevant Medications   lisinopril  (ZESTRIL ) 40 MG tablet     Other   Depression, recurrent (HCC)   Tolerating his medicine well. No concerns. Feeling more like himself. Continue to monitor.       Relevant Medications   FLUoxetine  (PROZAC ) 10 MG capsule     Follow up plan: Return in about 2 months (around 08/14/2023).

## 2023-06-14 NOTE — Assessment & Plan Note (Signed)
 Tolerating his medicine well. No concerns. Feeling more like himself. Continue to monitor.

## 2023-06-14 NOTE — Assessment & Plan Note (Signed)
 Under good control on current regimen. Continue current regimen. Continue to monitor. Call with any concerns. Refills given. Labs drawn today.

## 2023-06-16 ENCOUNTER — Other Ambulatory Visit: Payer: Self-pay | Admitting: Family Medicine

## 2023-06-18 NOTE — Telephone Encounter (Signed)
 Requested Prescriptions  Refused Prescriptions Disp Refills   metFORMIN  (GLUCOPHAGE ) 500 MG tablet [Pharmacy Med Name: metFORMIN  HCl 500 MG Oral Tablet] 100 tablet 2    Sig: TAKE 1 TABLET BY MOUTH DAILY     Endocrinology:  Diabetes - Biguanides Failed - 06/18/2023  3:44 PM      Failed - Cr in normal range and within 360 days    Creatinine, Ser  Date Value Ref Range Status  01/25/2023 1.35 (H) 0.76 - 1.27 mg/dL Final         Failed - eGFR in normal range and within 360 days    GFR calc Af Amer  Date Value Ref Range Status  10/09/2019 64 >59 mL/min/1.73 Final    Comment:    **[facility] currently reports eGFR in compliance with the current**   recommendations of the [employer]. [facility] will   update reporting as new guidelines are published from the NKF-ASN   Task force.    GFR calc non Af Amer  Date Value Ref Range Status  10/09/2019 55 (L) >59 mL/min/1.73 Final   eGFR  Date Value Ref Range Status  01/25/2023 55 (L) >59 mL/min/1.73 Final         Failed - B12 Level in normal range and within 720 days    No results found for: "VITAMINB12"       Passed - HBA1C is between 0 and 7.9 and within 180 days    HB A1C (BAYER DCA - WAIVED)  Date Value Ref Range Status  04/25/2023 5.4 4.8 - 5.6 % Final    Comment:             Prediabetes: 5.7 - 6.4          Diabetes: >6.4          Glycemic control for adults with diabetes: <7.0          Passed - Valid encounter within last 6 months    Recent Outpatient Visits           4 days ago Primary hypertension   [place] [facility] [place], Megan P, DO   1 month ago Controlled type 2 diabetes mellitus with complication, without long-term current use of insulin (HCC)   [place] [facility] [place], [place], DO       Future Appointments             In 1 month Johnson, Megan P, DO [place] [facility]            Passed - CBC within normal limits and completed in  the last 12 months    WBC  Date Value Ref Range Status  10/23/2022 11.8 (H) 3.4 - 10.8 x10E3/uL Final   RBC  Date Value Ref Range Status  10/23/2022 5.56 4.14 - 5.80 x10E6/uL Final   Hemoglobin  Date Value Ref Range Status  10/23/2022 16.2 13.0 - 17.7 g/dL Final   Hematocrit  Date Value Ref Range Status  10/23/2022 48.8 37.5 - 51.0 % Final   MCHC  Date Value Ref Range Status  10/23/2022 33.2 31.5 - 35.7 g/dL Final   [facility]  Date Value Ref Range Status  10/23/2022 29.1 26.6 - 33.0 pg Final   MCV  Date Value Ref Range Status  10/23/2022 88 79 - 97 fL Final   No results found for: "PLTCOUNTKUC", "LABPLAT", "POCPLA" RDW  Date Value Ref Range Status  10/23/2022 13.1 11.6 - 15.4 % Final

## 2023-06-19 DIAGNOSIS — M62830 Muscle spasm of back: Secondary | ICD-10-CM | POA: Diagnosis not present

## 2023-06-19 DIAGNOSIS — M545 Low back pain, unspecified: Secondary | ICD-10-CM | POA: Diagnosis not present

## 2023-06-19 DIAGNOSIS — M9901 Segmental and somatic dysfunction of cervical region: Secondary | ICD-10-CM | POA: Diagnosis not present

## 2023-06-20 DIAGNOSIS — M9901 Segmental and somatic dysfunction of cervical region: Secondary | ICD-10-CM | POA: Diagnosis not present

## 2023-06-20 DIAGNOSIS — M545 Low back pain, unspecified: Secondary | ICD-10-CM | POA: Diagnosis not present

## 2023-06-25 DIAGNOSIS — M9901 Segmental and somatic dysfunction of cervical region: Secondary | ICD-10-CM | POA: Diagnosis not present

## 2023-06-25 DIAGNOSIS — M545 Low back pain, unspecified: Secondary | ICD-10-CM | POA: Diagnosis not present

## 2023-06-26 DIAGNOSIS — M545 Low back pain, unspecified: Secondary | ICD-10-CM | POA: Diagnosis not present

## 2023-06-26 DIAGNOSIS — M9901 Segmental and somatic dysfunction of cervical region: Secondary | ICD-10-CM | POA: Diagnosis not present

## 2023-06-27 DIAGNOSIS — M9901 Segmental and somatic dysfunction of cervical region: Secondary | ICD-10-CM | POA: Diagnosis not present

## 2023-06-27 DIAGNOSIS — M545 Low back pain, unspecified: Secondary | ICD-10-CM | POA: Diagnosis not present

## 2023-07-02 DIAGNOSIS — M62830 Muscle spasm of back: Secondary | ICD-10-CM | POA: Diagnosis not present

## 2023-07-02 DIAGNOSIS — M9901 Segmental and somatic dysfunction of cervical region: Secondary | ICD-10-CM | POA: Diagnosis not present

## 2023-07-02 DIAGNOSIS — M545 Low back pain, unspecified: Secondary | ICD-10-CM | POA: Diagnosis not present

## 2023-07-03 ENCOUNTER — Encounter: Payer: Self-pay | Admitting: Family Medicine

## 2023-07-04 DIAGNOSIS — M9901 Segmental and somatic dysfunction of cervical region: Secondary | ICD-10-CM | POA: Diagnosis not present

## 2023-07-04 DIAGNOSIS — M545 Low back pain, unspecified: Secondary | ICD-10-CM | POA: Diagnosis not present

## 2023-07-06 ENCOUNTER — Other Ambulatory Visit: Payer: Self-pay | Admitting: Family Medicine

## 2023-07-09 DIAGNOSIS — M545 Low back pain, unspecified: Secondary | ICD-10-CM | POA: Diagnosis not present

## 2023-07-09 DIAGNOSIS — M9901 Segmental and somatic dysfunction of cervical region: Secondary | ICD-10-CM | POA: Diagnosis not present

## 2023-07-09 LAB — HM DIABETES EYE EXAM

## 2023-07-09 NOTE — Telephone Encounter (Signed)
 Requested Prescriptions  Refused Prescriptions Disp Refills   lisinopril  (ZESTRIL ) 40 MG tablet [Pharmacy Med Name: Lisinopril  40 MG Oral Tablet] 100 tablet 2    Sig: TAKE 1 TABLET BY MOUTH DAILY     Cardiovascular:  ACE Inhibitors Failed - 07/09/2023  4:10 PM      Failed - Cr in normal range and within 180 days    Creatinine, Ser  Date Value Ref Range Status  01/25/2023 1.35 (H) 0.76 - 1.27 mg/dL Final         Passed - K in normal range and within 180 days    Potassium  Date Value Ref Range Status  01/25/2023 4.2 3.5 - 5.2 mmol/L Final         Passed - Patient is not pregnant      Passed - Last BP in normal range    BP Readings from Last 1 Encounters:  06/14/23 128/85         Passed - Valid encounter within last 6 months    Recent Outpatient Visits           3 weeks ago Primary hypertension   [place] [facility] [place], Megan P, DO   2 months ago Controlled type 2 diabetes mellitus with complication, without long-term current use of insulin (HCC)   [place] [facility] [place], Duwaine SQUIBB, DO       Future Appointments             In 1 week Vicci, Duwaine SQUIBB, DO  [facility], [facility]

## 2023-07-10 DIAGNOSIS — M9901 Segmental and somatic dysfunction of cervical region: Secondary | ICD-10-CM | POA: Diagnosis not present

## 2023-07-10 DIAGNOSIS — M545 Low back pain, unspecified: Secondary | ICD-10-CM | POA: Diagnosis not present

## 2023-07-15 ENCOUNTER — Other Ambulatory Visit: Payer: Self-pay | Admitting: Family Medicine

## 2023-07-16 DIAGNOSIS — M545 Low back pain, unspecified: Secondary | ICD-10-CM | POA: Diagnosis not present

## 2023-07-16 DIAGNOSIS — M9901 Segmental and somatic dysfunction of cervical region: Secondary | ICD-10-CM | POA: Diagnosis not present

## 2023-07-17 ENCOUNTER — Encounter: Payer: Self-pay | Admitting: Family Medicine

## 2023-07-17 ENCOUNTER — Ambulatory Visit (INDEPENDENT_AMBULATORY_CARE_PROVIDER_SITE_OTHER): Admitting: Family Medicine

## 2023-07-17 VITALS — BP 156/80 | HR 65 | Temp 97.9°F | Ht 75.0 in | Wt 230.0 lb

## 2023-07-17 DIAGNOSIS — N1831 Chronic kidney disease, stage 3a: Secondary | ICD-10-CM | POA: Diagnosis not present

## 2023-07-17 DIAGNOSIS — F339 Major depressive disorder, recurrent, unspecified: Secondary | ICD-10-CM

## 2023-07-17 DIAGNOSIS — I10 Essential (primary) hypertension: Secondary | ICD-10-CM

## 2023-07-17 DIAGNOSIS — E790 Hyperuricemia without signs of inflammatory arthritis and tophaceous disease: Secondary | ICD-10-CM

## 2023-07-17 DIAGNOSIS — E039 Hypothyroidism, unspecified: Secondary | ICD-10-CM | POA: Diagnosis not present

## 2023-07-17 DIAGNOSIS — E118 Type 2 diabetes mellitus with unspecified complications: Secondary | ICD-10-CM

## 2023-07-17 DIAGNOSIS — D692 Other nonthrombocytopenic purpura: Secondary | ICD-10-CM

## 2023-07-17 DIAGNOSIS — E782 Mixed hyperlipidemia: Secondary | ICD-10-CM

## 2023-07-17 DIAGNOSIS — R972 Elevated prostate specific antigen [PSA]: Secondary | ICD-10-CM

## 2023-07-17 LAB — BAYER DCA HB A1C WAIVED: HB A1C (BAYER DCA - WAIVED): 5.5 % (ref 4.8–5.6)

## 2023-07-17 MED ORDER — AMLODIPINE BESYLATE 2.5 MG PO TABS: 1.2500 mg | ORAL_TABLET | Freq: Every day | ORAL | 1 refills | Status: DC

## 2023-07-17 MED ORDER — MELOXICAM 15 MG PO TABS: 15.0000 mg | ORAL_TABLET | Freq: Every day | ORAL | 1 refills | Status: DC

## 2023-07-17 MED ORDER — ASPIRIN EC 81 MG PO TBEC: 81.0000 mg | DELAYED_RELEASE_TABLET | Freq: Every day | ORAL | 4 refills | Status: AC

## 2023-07-17 MED ORDER — OMEPRAZOLE 40 MG PO CPDR: 40.0000 mg | DELAYED_RELEASE_CAPSULE | Freq: Every day | ORAL | 1 refills | Status: AC

## 2023-07-17 MED ORDER — LISINOPRIL 40 MG PO TABS: 40.0000 mg | ORAL_TABLET | Freq: Every day | ORAL | 1 refills | Status: DC

## 2023-07-17 MED ORDER — ALLOPURINOL 100 MG PO TABS: 100.0000 mg | ORAL_TABLET | Freq: Every day | ORAL | 1 refills | Status: DC

## 2023-07-17 MED ORDER — FLUOXETINE HCL 20 MG PO CAPS: 20.0000 mg | ORAL_CAPSULE | Freq: Every day | ORAL | 1 refills | Status: DC

## 2023-07-17 NOTE — Assessment & Plan Note (Signed)
 Still running high. Will add in 1.25mg  amlodipine  and recheck in 4 months. Call with any concerns.

## 2023-07-17 NOTE — Assessment & Plan Note (Signed)
 Under good control on current regimen. Continue current regimen. Continue to monitor. Call with any concerns. Refills given. Labs drawn today.

## 2023-07-17 NOTE — Assessment & Plan Note (Signed)
 Doing better on his prozac , but would like to go up a little. Increase to 20mg  and recheck in 4 months. Call with any concerns.

## 2023-07-17 NOTE — Telephone Encounter (Signed)
 Too soon for refill.  Requested Prescriptions  Pending Prescriptions Disp Refills   levothyroxine  (SYNTHROID ) 75 MCG tablet [Pharmacy Med Name: LEVOTHYROXINE  0.075MG  ( ) TABS] 100 tablet 1    Sig: TAKE 1 TABLET BY MOUTH DAILY BEFORE BREAKFAST     Endocrinology:  Hypothyroid Agents Passed - 07/17/2023  9:57 AM      Passed - TSH in normal range and within 360 days    TSH  Date Value Ref Range Status  10/23/2022 1.180 0.450 - 4.500 uIU/mL Final         Passed - Valid encounter within last 12 months    Recent Outpatient Visits           1 month ago Primary hypertension   [place] [facility] [place], Megan P, DO   2 months ago Controlled type 2 diabetes mellitus with complication, without long-term current use of insulin (HCC)   [place] [facility] [place], Duwaine SQUIBB, DO       Future Appointments             Today Vicci Duwaine SQUIBB, DO [place] [facility], [facility]

## 2023-07-17 NOTE — Assessment & Plan Note (Signed)
 Rechecking labs today. Await results. Treat as needed.

## 2023-07-17 NOTE — Assessment & Plan Note (Signed)
 Doing great with A1c of 5.5. Continue current regimen. Continue to monitor. Call with any concerns.

## 2023-07-17 NOTE — Progress Notes (Signed)
 BP (!) 156/80   Pulse 65   Temp 97.9 F (36.6 C) (Oral)   Ht 6' 3 (1.905 m)   Wt 230 lb (104.3 kg)   SpO2 98%   BMI 28.75 kg/m    Subjective:    Patient ID: Kenneth Johnston, male    DOB: [birth year], 73 y.o.   MRN: [id number]  HPI: Kenneth Johnston is a 72 y.o. male  Chief Complaint  Patient presents with   Diabetes   Hypertension   DIABETES Hypoglycemic episodes:no Polydipsia/polyuria: no Visual disturbance: no Chest pain: no Paresthesias: no Glucose Monitoring: no  Accucheck frequency: Not Checking Taking Insulin?: no Blood Pressure Monitoring: not checking Retinal Examination: Up to Date Foot Exam: Up to Date Diabetic Education: Completed Pneumovax: Not up to Date Influenza: Not up to Date Aspirin : yes  Had swelling and pain in his R foot over the weekend.   HYPERTENSION / HYPERLIPIDEMIA Satisfied with current treatment? yes Duration of hypertension: chronic BP monitoring frequency: not checking BP medication side effects: no Past BP meds: lisinopril  Duration of hyperlipidemia: chronic Cholesterol medication side effects: no Cholesterol supplements: none Past cholesterol medications: none Medication compliance: excellent compliance Aspirin : yes Recent stressors: no Recurrent headaches: no Visual changes: no Palpitations: no Dyspnea: no Chest pain: no Lower extremity edema: no Dizzy/lightheaded: no  HYPOTHYROIDISM Thyroid  control status:controlled Satisfied with current treatment? yes Medication side effects: no Medication compliance: excellent compliance Recent dose adjustment:no Fatigue: no Cold intolerance: no Heat intolerance: no Weight gain: no Weight loss: no Constipation: no Diarrhea/loose stools: no Palpitations: no Lower extremity edema: no Anxiety/depressed mood: yes  DEPRESSION Mood status: controlled Satisfied with current treatment?: yes Symptom severity: mild  Duration of current treatment : months Side effects:  no Medication compliance: excellent compliance Psychotherapy/counseling: no  Previous psychiatric medications: prozac  Depressed mood: yes Anxious mood: no Anhedonia: no Significant weight loss or gain: no Insomnia: no  Fatigue: yes Feelings of worthlessness or guilt: no Impaired concentration/indecisiveness: no Suicidal ideations: no Hopelessness: no Crying spells: no    06/14/2023   11:00 AM 04/25/2023   11:15 AM 12/04/2022    9:16 AM 10/23/2022   10:08 AM 04/21/2022   10:24 AM  Depression screen PHQ 2/9  Decreased Interest 0 0 0 0 0  Down, Depressed, Hopeless 0 0 0 0 0  PHQ - 2 Score 0 0 0 0 0  Altered sleeping 0 0 0 0 0  Tired, decreased energy 0 0 0 3 0  Change in appetite 0 0 0 0 0  Feeling bad or failure about yourself  0 0 0 0 0  Trouble concentrating 0 0 0 0 0  Moving slowly or fidgety/restless 0 0 0 0 0  Suicidal thoughts 0 0 0 0 0  PHQ-9 Score 0 0 0 3 0  Difficult doing work/chores   Not difficult at all Extremely dIfficult Not difficult at all    Relevant past medical, surgical, family and social history reviewed and updated as indicated. Interim medical history since our last visit reviewed. Allergies and medications reviewed and updated.  Review of Systems  Constitutional: Negative.   Respiratory: Negative.    Cardiovascular: Negative.   Musculoskeletal: Negative.   Skin: Negative.   Psychiatric/Behavioral:  Positive for dysphoric mood. Negative for agitation, behavioral problems, confusion, decreased concentration, hallucinations, self-injury, sleep disturbance and suicidal ideas. The patient is not nervous/anxious and is not hyperactive.     Per HPI unless specifically indicated above     Objective:  BP (!) 156/80   Pulse 65   Temp 97.9 F (36.6 C) (Oral)   Ht 6' 3 (1.905 m)   Wt 230 lb (104.3 kg)   SpO2 98%   BMI 28.75 kg/m   Wt Readings from Last 3 Encounters:  07/17/23 230 lb (104.3 kg)  06/14/23 229 lb 12.8 oz (104.2 kg)  04/25/23 234  lb 12.8 oz (106.5 kg)    Physical Exam Vitals and nursing note reviewed.  Constitutional:      General: He is not in acute distress.    Appearance: Normal appearance. He is not ill-appearing, toxic-appearing or diaphoretic.  HENT:     Head: Normocephalic and atraumatic.     Right Ear: External ear normal.     Left Ear: External ear normal.     Nose: Nose normal.     Mouth/Throat:     Mouth: Mucous membranes are moist.     Pharynx: Oropharynx is clear.   Eyes:     General: No scleral icterus.       Right eye: No discharge.        Left eye: No discharge.     Extraocular Movements: Extraocular movements intact.     Conjunctiva/sclera: Conjunctivae normal.     Pupils: Pupils are equal, round, and reactive to light.    Cardiovascular:     Rate and Rhythm: Normal rate and regular rhythm.     Pulses: Normal pulses.     Heart sounds: Normal heart sounds. No murmur heard.    No friction rub. No gallop.  Pulmonary:     Effort: Pulmonary effort is normal. No respiratory distress.     Breath sounds: Normal breath sounds. No stridor. No wheezing, rhonchi or rales.  Chest:     Chest wall: No tenderness.   Musculoskeletal:        General: Normal range of motion.     Cervical back: Normal range of motion and neck supple.   Skin:    General: Skin is warm and dry.     Capillary Refill: Capillary refill takes less than 2 seconds.     Coloration: Skin is not jaundiced or pale.     Findings: No bruising, erythema, lesion or rash.   Neurological:     General: No focal deficit present.     Mental Status: He is alert and oriented to person, place, and time. Mental status is at baseline.   Psychiatric:        Mood and Affect: Mood normal.        Behavior: Behavior normal.        Thought Content: Thought content normal.        Judgment: Judgment normal.     Results for orders placed or performed in visit on 04/25/23  Bayer DCA Hb A1c Waived   Collection Time: 04/25/23 11:27 AM   Result Value Ref Range   HB A1C (BAYER DCA - WAIVED) 5.4 4.8 - 5.6 %      Assessment & Plan:   Problem List Items Addressed This Visit       Cardiovascular and Mediastinum   HTN (hypertension)   Still running high. Will add in 1.25mg  amlodipine  and recheck in 4 months. Call with any concerns.       Relevant Medications   aspirin  EC 81 MG tablet   lisinopril  (ZESTRIL ) 40 MG tablet   amLODipine  (NORVASC ) 2.5 MG tablet   Senile purpura (HCC)   Reassured patient. Continue to monitor.  Relevant Medications   aspirin  EC 81 MG tablet   lisinopril  (ZESTRIL ) 40 MG tablet   amLODipine  (NORVASC ) 2.5 MG tablet     Endocrine   Hypothyroidism   Rechecking labs today. Await results. Treat as needed.       Relevant Orders   TSH   Controlled type 2 diabetes mellitus with complication, without long-term current use of insulin (HCC) - Primary   Doing great with A1c of 5.5. Continue current regimen. Continue to monitor. Call with any concerns.       Relevant Medications   aspirin  EC 81 MG tablet   lisinopril  (ZESTRIL ) 40 MG tablet   Other Relevant Orders   Bayer DCA Hb A1c Waived   CBC with Differential/Platelet   Comprehensive metabolic panel with GFR   Lipid Panel w/o Chol/HDL Ratio     Genitourinary   Chronic kidney disease, stage 3a (HCC)   Rechecking labs today. Await results. Treat as needed.         Other   Hyperlipidemia   Under good control on current regimen. Continue current regimen. Continue to monitor. Call with any concerns. Refills given. Labs drawn today.        Relevant Medications   aspirin  EC 81 MG tablet   lisinopril  (ZESTRIL ) 40 MG tablet   amLODipine  (NORVASC ) 2.5 MG tablet   Elevated uric acid in blood   Rechecking labs today. Await results. Treat as needed.       Relevant Orders   Uric acid   Depression, recurrent (HCC)   Doing better on his prozac , but would like to go up a little. Increase to 20mg  and recheck in 4 months. Call with  any concerns.       Relevant Medications   FLUoxetine  (PROZAC ) 20 MG capsule   Other Visit Diagnoses       Elevated PSA       Rechecking labs today. Await results. Treat as needed.   Relevant Orders   PSA        Follow up plan: Return in about 4 months (around 11/17/2023) for physical/AWV.

## 2023-07-17 NOTE — Assessment & Plan Note (Signed)
 Reassured patient. Continue to monitor.

## 2023-07-18 LAB — LIPID PANEL W/O CHOL/HDL RATIO
Cholesterol, Total: 138 mg/dL (ref 100–199)
HDL: 32 mg/dL — ABNORMAL LOW (ref >39)
LDL Chol Calc (NIH): 81 mg/dL (ref 0–99)
Triglycerides: 138 mg/dL (ref 0–149)
VLDL Cholesterol Cal: 25 mg/dL (ref 5–40)

## 2023-07-18 LAB — COMPREHENSIVE METABOLIC PANEL WITH GFR
ALT: 16 IU/L (ref 0–44)
AST: 19 IU/L (ref 0–40)
Albumin: 4.1 g/dL (ref 3.8–4.8)
Alkaline Phosphatase: 61 IU/L (ref 44–121)
BUN/Creatinine Ratio: 13 (ref 10–24)
BUN: 18 mg/dL (ref 8–27)
Bilirubin Total: 0.2 mg/dL (ref 0.0–1.2)
CO2: 18 mmol/L — ABNORMAL LOW (ref 20–29)
Calcium: 9.5 mg/dL (ref 8.6–10.2)
Chloride: 107 mmol/L — ABNORMAL HIGH (ref 96–106)
Creatinine, Ser: 1.41 mg/dL — ABNORMAL HIGH (ref 0.76–1.27)
Globulin, Total: 2.7 g/dL (ref 1.5–4.5)
Glucose: 120 mg/dL — ABNORMAL HIGH (ref 70–99)
Potassium: 4.1 mmol/L (ref 3.5–5.2)
Sodium: 141 mmol/L (ref 134–144)
Total Protein: 6.8 g/dL (ref 6.0–8.5)
eGFR: 52 mL/min/{1.73_m2} — ABNORMAL LOW (ref >59)

## 2023-07-18 LAB — CBC WITH DIFFERENTIAL/PLATELET
Basophils Absolute: 0.1 10*3/uL (ref 0.0–0.2)
Basos: 1 %
EOS (ABSOLUTE): 0.1 10*3/uL (ref 0.0–0.4)
Eos: 1 %
Hematocrit: 46.9 % (ref 37.5–51.0)
Hemoglobin: 15.2 g/dL (ref 13.0–17.7)
Immature Grans (Abs): 0 10*3/uL (ref 0.0–0.1)
Immature Granulocytes: 0 %
Lymphocytes Absolute: 1.4 10*3/uL (ref 0.7–3.1)
Lymphs: 17 %
MCH: 28.9 pg (ref 26.6–33.0)
MCHC: 32.4 g/dL (ref 31.5–35.7)
MCV: 89 fL (ref 79–97)
Monocytes Absolute: 0.5 10*3/uL (ref 0.1–0.9)
Monocytes: 7 %
Neutrophils Absolute: 5.9 10*3/uL (ref 1.4–7.0)
Neutrophils: 74 %
Platelets: 221 10*3/uL (ref 150–450)
RBC: 5.26 x10E6/uL (ref 4.14–5.80)
RDW: 13.2 % (ref 11.6–15.4)
WBC: 7.9 10*3/uL (ref 3.4–10.8)

## 2023-07-18 LAB — URIC ACID: Uric Acid: 7.4 mg/dL (ref 3.8–8.4)

## 2023-07-18 LAB — PSA: Prostate Specific Ag, Serum: 8.7 ng/mL — ABNORMAL HIGH (ref 0.0–4.0)

## 2023-07-18 LAB — TSH: TSH: 2.29 u[IU]/mL (ref 0.450–4.500)

## 2023-07-19 ENCOUNTER — Ambulatory Visit: Payer: Self-pay | Admitting: Family Medicine

## 2023-07-19 DIAGNOSIS — R972 Elevated prostate specific antigen [PSA]: Secondary | ICD-10-CM

## 2023-07-23 DIAGNOSIS — M545 Low back pain, unspecified: Secondary | ICD-10-CM | POA: Diagnosis not present

## 2023-07-23 DIAGNOSIS — M9901 Segmental and somatic dysfunction of cervical region: Secondary | ICD-10-CM | POA: Diagnosis not present

## 2023-07-25 ENCOUNTER — Ambulatory Visit: Payer: Self-pay | Admitting: Family Medicine

## 2023-08-06 DIAGNOSIS — M545 Low back pain, unspecified: Secondary | ICD-10-CM | POA: Diagnosis not present

## 2023-08-06 DIAGNOSIS — M9901 Segmental and somatic dysfunction of cervical region: Secondary | ICD-10-CM | POA: Diagnosis not present

## 2023-08-06 DIAGNOSIS — M62830 Muscle spasm of back: Secondary | ICD-10-CM | POA: Diagnosis not present

## 2023-08-20 DIAGNOSIS — M545 Low back pain, unspecified: Secondary | ICD-10-CM | POA: Diagnosis not present

## 2023-08-20 DIAGNOSIS — M9901 Segmental and somatic dysfunction of cervical region: Secondary | ICD-10-CM | POA: Diagnosis not present

## 2023-08-20 DIAGNOSIS — M62830 Muscle spasm of back: Secondary | ICD-10-CM | POA: Diagnosis not present

## 2023-08-22 ENCOUNTER — Ambulatory Visit: Admitting: Urology

## 2023-08-22 ENCOUNTER — Encounter: Payer: Self-pay | Admitting: Urology

## 2023-08-22 VITALS — BP 157/78 | HR 76 | Ht 74.0 in | Wt 228.0 lb

## 2023-08-22 DIAGNOSIS — Z125 Encounter for screening for malignant neoplasm of prostate: Secondary | ICD-10-CM

## 2023-08-22 NOTE — Patient Instructions (Signed)
 Scheduling number: [phone]

## 2023-08-22 NOTE — Progress Notes (Signed)
 08/22/2023 10:24 AM   [name] 02/28/1950 [id number]  Referring provider: Vicci Duwaine SQUIBB, DO [address] [place],  [place] [address]  Chief Complaint  Patient presents with   Elevated PSA    HPI: ARJUN HARD is a 73 y.o. male referred for evaluation of an elevated PSA.  PSA levels: PSA rise to 7.1 10/23/2022; repeat PSA 12/04/2022 was 5.5; follow-up PSA 07/17/2023 was 8.7 Baseline PSA level: Upper 1-low 2 range from 2017-2023; 3.5 on 10/12/2021 Lower urinary tract symptoms: Occasional urgency with small volume urge incontinence which is not bothersome Family history prostate cancer first-degree relatives: Father was diagnosed with prostate cancer in his mid 23s Past urologic history: Saw Dr. Carlie >30 years ago however he does not remember the reason  PMH: Past Medical History:  Diagnosis Date   Allergy    Arthritis    CKD (chronic kidney disease), stage III (HCC)    Coronary artery calcification    Hyperlipidemia    Hypertension    Hypothyroidism    Umbilical hernia 03/04/2015    Surgical History: Past Surgical History:  Procedure Laterality Date   KNEE CARTILAGE SURGERY Bilateral     Home Medications:  Allergies as of 08/22/2023   No Known Allergies      Medication List        Accurate as of August 22, 2023 10:24 AM. If you have any questions, ask your nurse or doctor.          allopurinol  100 MG tablet Commonly known as: ZYLOPRIM  Take 1 tablet (100 mg total) by mouth daily.   amLODipine  2.5 MG tablet Commonly known as: NORVASC  Take 0.5 tablets (1.25 mg total) by mouth daily.   aspirin  EC 81 MG tablet Take 1 tablet (81 mg total) by mouth daily.   cyclobenzaprine  10 MG tablet Commonly known as: FLEXERIL  Take 1 tablet (10 mg total) by mouth at bedtime.   ezetimibe  10 MG tablet Commonly known as: ZETIA  Take 1 tablet (10 mg total) by mouth daily.   FLUoxetine  20 MG capsule Commonly known as: PROZAC  Take 1 capsule (20 mg total) by mouth  daily.   levothyroxine  75 MCG tablet Commonly known as: SYNTHROID  TAKE 1 TABLET BY MOUTH DAILY  BEFORE BREAKFAST   lisinopril  40 MG tablet Commonly known as: ZESTRIL  Take 1 tablet (40 mg total) by mouth daily.   meloxicam  15 MG tablet Commonly known as: MOBIC  Take 1 tablet (15 mg total) by mouth daily.   omeprazole  40 MG capsule Commonly known as: PRILOSEC Take 1 capsule (40 mg total) by mouth daily.        Allergies: No Known Allergies  Family History: Family History  Problem Relation Age of Onset   Kidney failure Mother    Aneurysm Father        brain   Kidney disease Paternal Grandmother    Heart attack Paternal Grandfather     Social History:  reports that he quit smoking about 28 years ago. His smoking use included cigarettes. He has never used smokeless tobacco. He reports current alcohol use. He reports that he does not use drugs.   Physical Exam: BP (!) 157/78   Pulse 76   Ht 6' 2 (1.88 m)   Wt 228 lb (103.4 kg)   BMI 29.27 kg/m   Constitutional:  Alert and oriented, No acute distress. HEENT: [place] AT Respiratory: Normal respiratory effort, no increased work of breathing. GU: Prostate 45 g, smooth without nodules or induration.  L >  R however consistency normal and uniform throughout Psychiatric: Normal mood and affect.    Assessment & Plan:    1.  Elevated PSA Benign DRE Although PSA is a prostate cancer screening test he was informed that cancer is not the most common cause of an elevated PSA. Other potential causes including BPH and inflammation were discussed.  He was informed that the only way to adequately diagnose prostate cancer would be transrectal ultrasound and biopsy of the prostate. The procedure was discussed including potential risks of bleeding and infection/sepsis. He was also informed that a negative biopsy does not conclusively rule out the possibility that prostate cancer may be present and that continued monitoring is required.  The  use of newer adjunctive blood and urine tests to predict the probability of high-grade prostate cancer were discussed. The use of multiparametric prostate MRI to evaluate for lesions suspicious for high grade prostate cancer and aid in targeted bx was reviewed.  Continued periodic surveillance was also discussed. I recommended further evaluation with a prostate MRI    Glendia JAYSON Barba, MD  [facility] [address], [address] [place], [place] [address] [phone]

## 2023-08-27 ENCOUNTER — Encounter: Payer: Self-pay | Admitting: Family Medicine

## 2023-08-27 ENCOUNTER — Ambulatory Visit (INDEPENDENT_AMBULATORY_CARE_PROVIDER_SITE_OTHER): Admitting: Family Medicine

## 2023-08-27 VITALS — BP 126/63 | HR 66 | Temp 98.5°F | Ht 74.0 in | Wt 227.0 lb

## 2023-08-27 DIAGNOSIS — I10 Essential (primary) hypertension: Secondary | ICD-10-CM

## 2023-08-27 NOTE — Assessment & Plan Note (Signed)
 Under good control on current regimen. Continue current regimen. Continue to monitor. Call with any concerns. Refills given.

## 2023-08-27 NOTE — Progress Notes (Signed)
 BP 126/63   Pulse 66   Temp 98.5 F (36.9 C) (Oral)   Ht 6' 2 (1.88 m)   Wt 227 lb (103 kg)   SpO2 96%   BMI 29.15 kg/m    Subjective:    Patient ID: Kenneth Johnston, male    DOB: 06-Aug-1950, 73 y.o.   MRN: [id number]  HPI: Kenneth Johnston is a 73 y.o. male  Chief Complaint  Patient presents with   Hypertension    Has been running about the higher end of 150's   HYPERTENSION  Hypertension status: controlled  Satisfied with current treatment? yes Duration of hypertension: chronic BP monitoring frequency:  a few times a month BP range: 120s/70s BP medication side effects:  no Medication compliance: excellent compliance Previous BP meds:amlodipine , lisinopril  Aspirin : yes Recurrent headaches: no Visual changes: no Palpitations: no Dyspnea: no Chest pain: no Lower extremity edema: no Dizzy/lightheaded: no  Relevant past medical, surgical, family and social history reviewed and updated as indicated. Interim medical history since our last visit reviewed. Allergies and medications reviewed and updated.  Review of Systems  Constitutional: Negative.   Respiratory: Negative.    Cardiovascular: Negative.   Musculoskeletal: Negative.   Neurological: Negative.   Psychiatric/Behavioral: Negative.      Per HPI unless specifically indicated above     Objective:    BP 126/63   Pulse 66   Temp 98.5 F (36.9 C) (Oral)   Ht 6' 2 (1.88 m)   Wt 227 lb (103 kg)   SpO2 96%   BMI 29.15 kg/m   Wt Readings from Last 3 Encounters:  08/27/23 227 lb (103 kg)  08/22/23 228 lb (103.4 kg)  07/17/23 230 lb (104.3 kg)    Physical Exam Vitals and nursing note reviewed.  Constitutional:      General: He is not in acute distress.    Appearance: Normal appearance. He is not ill-appearing, toxic-appearing or diaphoretic.  HENT:     Head: Normocephalic and atraumatic.     Right Ear: External ear normal.     Left Ear: External ear normal.     Nose: Nose normal.      Mouth/Throat:     Mouth: Mucous membranes are moist.     Pharynx: Oropharynx is clear.  Eyes:     General: No scleral icterus.       Right eye: No discharge.        Left eye: No discharge.     Extraocular Movements: Extraocular movements intact.     Conjunctiva/sclera: Conjunctivae normal.     Pupils: Pupils are equal, round, and reactive to light.  Cardiovascular:     Rate and Rhythm: Normal rate and regular rhythm.     Pulses: Normal pulses.     Heart sounds: Normal heart sounds. No murmur heard.    No friction rub. No gallop.  Pulmonary:     Effort: Pulmonary effort is normal. No respiratory distress.     Breath sounds: Normal breath sounds. No stridor. No wheezing, rhonchi or rales.  Chest:     Chest wall: No tenderness.  Musculoskeletal:        General: Normal range of motion.     Cervical back: Normal range of motion and neck supple.  Skin:    General: Skin is warm and dry.     Capillary Refill: Capillary refill takes less than 2 seconds.     Coloration: Skin is not jaundiced or pale.     Findings: No  bruising, erythema, lesion or rash.  Neurological:     General: No focal deficit present.     Mental Status: He is alert and oriented to person, place, and time. Mental status is at baseline.  Psychiatric:        Mood and Affect: Mood normal.        Behavior: Behavior normal.        Thought Content: Thought content normal.        Judgment: Judgment normal.     Results for orders placed or performed in visit on 07/17/23  Bayer DCA Hb A1c Waived   Collection Time: 07/17/23 11:22 AM  Result Value Ref Range   HB A1C (BAYER DCA - WAIVED) 5.5 4.8 - 5.6 %  CBC with Differential/Platelet   Collection Time: 07/17/23 11:23 AM  Result Value Ref Range   WBC 7.9 3.4 - 10.8 x10E3/uL   RBC 5.26 4.14 - 5.80 x10E6/uL   Hemoglobin 15.2 13.0 - 17.7 g/dL   Hematocrit 53.0 62.4 - 51.0 %   MCV 89 79 - 97 fL   MCH 28.9 26.6 - 33.0 pg   MCHC 32.4 31.5 - 35.7 g/dL   RDW 86.7 88.3 -  84.5 %   Platelets 221 150 - 450 x10E3/uL   Neutrophils 74 Not Estab. %   Lymphs 17 Not Estab. %   Monocytes 7 Not Estab. %   Eos 1 Not Estab. %   Basos 1 Not Estab. %   Neutrophils Absolute 5.9 1.4 - 7.0 x10E3/uL   Lymphocytes Absolute 1.4 0.7 - 3.1 x10E3/uL   Monocytes Absolute 0.5 0.1 - 0.9 x10E3/uL   EOS (ABSOLUTE) 0.1 0.0 - 0.4 x10E3/uL   Basophils Absolute 0.1 0.0 - 0.2 x10E3/uL   Immature Granulocytes 0 Not Estab. %   Immature Grans (Abs) 0.0 0.0 - 0.1 x10E3/uL  Comprehensive metabolic panel with GFR   Collection Time: 07/17/23 11:23 AM  Result Value Ref Range   Glucose 120 (H) 70 - 99 mg/dL   BUN 18 8 - 27 mg/dL   Creatinine, Ser 8.58 (H) 0.76 - 1.27 mg/dL   eGFR 52 (L) >40 fO/fpw/8.26   BUN/Creatinine Ratio 13 10 - 24   Sodium 141 134 - 144 mmol/L   Potassium 4.1 3.5 - 5.2 mmol/L   Chloride 107 (H) 96 - 106 mmol/L   CO2 18 (L) 20 - 29 mmol/L   Calcium  9.5 8.6 - 10.2 mg/dL   Total Protein 6.8 6.0 - 8.5 g/dL   Albumin 4.1 3.8 - 4.8 g/dL   Globulin, Total 2.7 1.5 - 4.5 g/dL   Bilirubin Total 0.2 0.0 - 1.2 mg/dL   Alkaline Phosphatase 61 44 - 121 IU/L   AST 19 0 - 40 IU/L   ALT 16 0 - 44 IU/L  Lipid Panel w/o Chol/HDL Ratio   Collection Time: 07/17/23 11:23 AM  Result Value Ref Range   Cholesterol, Total 138 100 - 199 mg/dL   Triglycerides 861 0 - 149 mg/dL   HDL 32 (L) >60 mg/dL   VLDL Cholesterol Cal 25 5 - 40 mg/dL   LDL Chol Calc (NIH) 81 0 - 99 mg/dL  PSA   Collection Time: 07/17/23 11:23 AM  Result Value Ref Range   Prostate Specific Ag, Serum 8.7 (H) 0.0 - 4.0 ng/mL  TSH   Collection Time: 07/17/23 11:23 AM  Result Value Ref Range   TSH 2.290 0.450 - 4.500 uIU/mL  Uric acid   Collection Time: 07/17/23 11:23 AM  Result  Value Ref Range   Uric Acid 7.4 3.8 - 8.4 mg/dL      Assessment & Plan:   Problem List Items Addressed This Visit       Cardiovascular and Mediastinum   HTN (hypertension) - Primary   Under good control on current regimen.  Continue current regimen. Continue to monitor. Call with any concerns. Refills given.        Relevant Orders   Basic metabolic panel with GFR     Follow up plan: Return for As scheduled.

## 2023-08-30 LAB — BASIC METABOLIC PANEL WITH GFR
BUN/Creatinine Ratio: 9 — ABNORMAL LOW (ref 10–24)
BUN: 14 mg/dL (ref 8–27)
CO2: 21 mmol/L (ref 20–29)
Calcium: 9.3 mg/dL (ref 8.6–10.2)
Chloride: 104 mmol/L (ref 96–106)
Creatinine, Ser: 1.48 mg/dL — ABNORMAL HIGH (ref 0.76–1.27)
Glucose: 99 mg/dL (ref 70–99)
Potassium: 4 mmol/L (ref 3.5–5.2)
Sodium: 142 mmol/L (ref 134–144)
eGFR: 49 mL/min/1.73 — ABNORMAL LOW (ref >59)

## 2023-09-02 ENCOUNTER — Ambulatory Visit: Payer: Self-pay | Admitting: Family Medicine

## 2023-09-04 ENCOUNTER — Encounter: Payer: Self-pay | Admitting: Cardiovascular Disease

## 2023-09-24 ENCOUNTER — Encounter: Payer: Self-pay | Admitting: Urology

## 2023-10-11 ENCOUNTER — Ambulatory Visit (INDEPENDENT_AMBULATORY_CARE_PROVIDER_SITE_OTHER): Admitting: Emergency Medicine

## 2023-10-11 VITALS — Ht 75.0 in | Wt 228.0 lb

## 2023-10-11 DIAGNOSIS — Z0000 Encounter for general adult medical examination without abnormal findings: Secondary | ICD-10-CM | POA: Diagnosis not present

## 2023-10-11 NOTE — Progress Notes (Signed)
 Subjective:   Kenneth Johnston is a 73 y.o. who presents for a Medicare Wellness preventive visit.  As a reminder, Annual Wellness Visits don't include a physical exam, and some assessments may be limited, especially if this visit is performed virtually. We may recommend an in-person follow-up visit with your provider if needed.  Visit Complete: Virtual I connected with  [name] on 10/11/23 by a audio enabled telemedicine application and verified that I am speaking with the correct person using two identifiers.  Patient Location: Home  Provider Location: Home Office  I discussed the limitations of evaluation and management by telemedicine. The patient expressed understanding and agreed to proceed.  Vital Signs: Because this visit was a virtual/telehealth visit, some criteria may be missing or patient reported. Any vitals not documented were not able to be obtained and vitals that have been documented are patient reported.  VideoDeclined- This patient declined Librarian, academic. Therefore the visit was completed with audio only.  Persons Participating in Visit: Patient.  AWV Questionnaire: Yes: Patient Medicare AWV questionnaire was completed by the patient on 10/07/23; I have confirmed that all information answered by patient is correct and no changes since this date.  Cardiac Risk Factors include: advanced age (>47men, >89 women);male gender;diabetes mellitus;hypertension;Other (see comment), Risk factor comments: CAD     Objective:    Today's Vitals   10/11/23 0840  Weight: 228 lb (103.4 kg)  Height: 6' 3 (1.905 m)   Body mass index is 28.5 kg/m.     10/11/2023    8:49 AM 10/15/2020    1:01 PM 10/13/2019    2:31 PM 05/17/2017    1:14 PM 05/16/2016   10:22 AM  Advanced Directives  Does Patient Have a Medical Advance Directive? No Yes Yes Yes  No   Type of Special educational needs teacher of [place];Living will Healthcare Power of  [place];Living will Living will;Healthcare Power of Attorney   Copy of Healthcare Power of Attorney in Chart?  No - copy requested No - copy requested No - copy requested    Would patient like information on creating a medical advance directive? No - Patient declined    Yes (MAU/Ambulatory/Procedural Areas - Information given)      Data saved with a previous flowsheet row definition    Current Medications (verified) Outpatient Encounter Medications as of 10/11/2023  Medication Sig   allopurinol  (ZYLOPRIM ) 100 MG tablet Take 1 tablet (100 mg total) by mouth daily.   amLODipine  (NORVASC ) 2.5 MG tablet Take 0.5 tablets (1.25 mg total) by mouth daily.   aspirin  EC 81 MG tablet Take 1 tablet (81 mg total) by mouth daily.   cyclobenzaprine  (FLEXERIL ) 10 MG tablet Take 1 tablet (10 mg total) by mouth at bedtime.   ezetimibe  (ZETIA ) 10 MG tablet Take 1 tablet (10 mg total) by mouth daily.   FLUoxetine  (PROZAC ) 20 MG capsule Take 1 capsule (20 mg total) by mouth daily.   levothyroxine  (SYNTHROID ) 75 MCG tablet TAKE 1 TABLET BY MOUTH DAILY  BEFORE BREAKFAST   lisinopril  (ZESTRIL ) 40 MG tablet Take 1 tablet (40 mg total) by mouth daily.   meloxicam  (MOBIC ) 15 MG tablet Take 1 tablet (15 mg total) by mouth daily. (Patient taking differently: Take 15 mg by mouth daily. PRN)   omeprazole  (PRILOSEC) 40 MG capsule Take 1 capsule (40 mg total) by mouth daily.   No facility-administered encounter medications on file as of 10/11/2023.    Allergies (verified) Patient has  no known allergies.   History: Past Medical History:  Diagnosis Date   Allergy    Arthritis    CKD (chronic kidney disease), stage III (HCC)    Coronary artery calcification    Hyperlipidemia    Hypertension    Hypothyroidism    Umbilical hernia 03/04/2015   Past Surgical History:  Procedure Laterality Date   KNEE CARTILAGE SURGERY Bilateral    Family History  Problem Relation Age of Onset   Kidney failure Mother    Aneurysm  Father        brain   Kidney disease Paternal Grandmother    Heart attack Paternal Grandfather    Social History   Socioeconomic History   Marital status: Married    Spouse name: Alisa   Number of children: 2   Years of education: Not on file   Highest education level: Associate degree: occupational, Scientist, product/process development, or vocational program  Occupational History   Not on file  Tobacco Use   Smoking status: Former    Current packs/day: 0.00    Types: Cigarettes    Quit date: 03/04/1995    Years since quitting: 28.6   Smokeless tobacco: Never  Vaping Use   Vaping status: Never Used  Substance and Sexual Activity   Alcohol use: Yes    Comment: very seldom, 1 drink monthly or less   Drug use: No   Sexual activity: Not Currently  Other Topics Concern   Not on file  Social History Narrative   [name]    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   Social Drivers of Corporate investment banker Strain: Low Risk  (10/11/2023)   Overall Financial Resource Strain (CARDIA)    Difficulty of Paying Living Expenses: Not hard at all  Food Insecurity: No Food Insecurity (10/11/2023)   Hunger Vital Sign    Worried About Running Out of Food in the Last Year: Never true    Ran Out of Food in the Last Year: Never true  Transportation Needs: No Transportation Needs (10/11/2023)   PRAPARE - Administrator, Civil Service (Medical): No    Lack of Transportation (Non-Medical): No  Physical Activity: Insufficiently Active (10/11/2023)   Exercise Vital Sign    Days of Exercise per Week: 3 days    Minutes of Exercise per Session: 40 min  Stress: No Stress Concern Present (10/11/2023)   [employer] of Occupational Health - Occupational Stress Questionnaire    Feeling of Stress: Not at all  Social Connections: Moderately Integrated (10/11/2023)   Social Connection and Isolation Panel    Frequency of Communication with Friends and Family: More than three times a week    Frequency of  Social Gatherings with Friends and Family: More than three times a week    Attends Religious Services: More than 4 times per year    Active Member of [employer] or Organizations: No    Attends Engineer, structural: Never    Marital Status: Married    Tobacco Counseling Counseling given: Not Answered    Clinical Intake:  Pre-visit preparation completed: Yes  Pain : No/denies pain     BMI - recorded: 28.5 Nutritional Status: BMI 25 -29 Overweight Nutritional Risks: None Diabetes: Yes CBG done?: No Did pt. bring in CBG monitor from home?: No  Lab Results  Component Value Date   HGBA1C 5.5 07/17/2023   HGBA1C 5.4 04/25/2023   HGBA1C 5.8 (H) 01/25/2023     How often do you need  to have someone help you when you read instructions, pamphlets, or other written materials from your doctor or pharmacy?: 1 - Never  Interpreter Needed?: No  Information entered by :: Vina Ned, CMA   Activities of Daily Living     10/11/2023    8:42 AM 10/07/2023    9:44 AM  In your present state of health, do you have any difficulty performing the following activities:  Hearing? 0 0  Vision? 0 0  Difficulty concentrating or making decisions? 0 0  Walking or climbing stairs? 0 0  Dressing or bathing? 0 0  Doing errands, shopping? 0 0  Preparing Food and eating ? N N  Using the Toilet? N N  In the past six months, have you accidently leaked urine? N N  Do you have problems with loss of bowel control? N N  Managing your Medications? N N  Managing your Finances? N N  Housekeeping or managing your Housekeeping? N N    Patient Care Team: Vicci Duwaine SQUIBB, DO as PCP - General (Family Medicine) Gollan, Evalene PARAS, MD as PCP - Cardiology (Cardiology) Pa, [facility] (Optometry)  I have updated your Care Teams any recent Medical Services you may have received from other providers in the past year.     Assessment:   This is a routine wellness examination for  [facility].  Hearing/Vision screen Hearing Screening - Comments:: Denies hearing loss  Vision Screening - Comments:: Gets [place] eye exams[place]   Goals Addressed             This Visit's Progress    COMPLETED: Patient Stated       10/13/2019, wants to weigh 230 pounds     Patient Stated       Lose 5-8 lbs       Depression Screen     10/11/2023    8:47 AM 06/14/2023   11:00 AM 04/25/2023   11:15 AM 12/04/2022    9:16 AM 10/23/2022   10:08 AM 04/21/2022   10:24 AM 10/12/2021    9:39 AM  PHQ 2/9 Scores  PHQ - 2 Score 0 0 0 0 0 0 0  PHQ- 9 Score 0 0 0 0 3 0 3    Fall Risk     10/11/2023    8:50 AM 10/07/2023    9:44 AM 10/23/2022   10:08 AM 04/21/2022   10:24 AM 10/12/2021    9:38 AM  Fall Risk   Falls in the past year? 0 0 0 0 0  Number falls in past yr: 0 0 0 0 0  Injury with Fall? 0 0 0 0 0  Risk for fall due to : No Fall Risks  No Fall Risks No Fall Risks No Fall Risks  Follow up Falls evaluation completed  Falls evaluation completed Falls evaluation completed Falls evaluation completed      Data saved with a previous flowsheet row definition    MEDICARE RISK AT HOME:  Medicare Risk at Home Any stairs in or around the home?: Yes If so, are there any without handrails?: No Home free of loose throw rugs in walkways, pet beds, electrical cords, etc?: Yes Adequate lighting in your home to reduce risk of falls?: Yes Life alert?: No Use of a cane, walker or w/c?: No Grab bars in the bathroom?: Yes Shower chair or bench in shower?: No Elevated toilet seat or a handicapped toilet?: No  TIMED UP AND GO:  Was the  test performed?  No  Cognitive Function: 6CIT completed        10/11/2023    8:51 AM 04/21/2022   10:47 AM 10/15/2020    1:03 PM 10/13/2019    2:33 PM 10/04/2018   10:18 AM  6CIT Screen  What Year? 0 points 0 points 0 points 0 points 0 points  What month? 0 points 0 points 0 points 0 points 0 points  What time? 0 points 0 points 0 points 0  points 0 points  Count back from 20 0 points 0 points 0 points 0 points 0 points  Months in reverse 0 points 0 points 0 points 0 points 0 points  Repeat phrase 0 points 0 points 2 points 0 points 2 points  Total Score 0 points 0 points 2 points 0 points 2 points    Immunizations Immunization History  Administered Date(s) Administered   PFIZER(Purple Top)SARS-COV-2 Vaccination 10/03/2019   Tdap 01/17/2019    Screening Tests Health Maintenance  Topic Date Due   Diabetic kidney evaluation - Urine ACR  10/23/2023   Zoster Vaccines- Shingrix (1 of 2) 10/17/2023 (Originally 12/29/1969)   Influenza Vaccine  04/15/2024 (Originally 08/17/2023)   Pneumococcal Vaccine: 50+ Years (1 of 2 - PCV) 07/16/2024 (Originally 12/29/1969)   OPHTHALMOLOGY EXAM  11/23/2023   HEMOGLOBIN A1C  01/17/2024   FOOT EXAM  01/25/2024   Fecal DNA (Cologuard)  03/30/2024   Diabetic kidney evaluation - eGFR measurement  08/26/2024   Medicare Annual Wellness (AWV)  10/10/2024   DTaP/Tdap/Td (2 - Td or Tdap) 01/16/2029   Hepatitis C Screening  Completed   HPV VACCINES  Aged Out   Meningococcal B Vaccine  Aged Out   COVID-19 Vaccine  Discontinued    Health Maintenance Items Addressed: See Nurse Notes at the end of this note  Additional Screening:  Vision Screening: Recommended annual ophthalmology exams for early detection of glaucoma and other disorders of the eye. Is the patient up to date with their annual eye exam?  Yes  Who is the provider or what is the name of the office in which the patient attends annual eye exams? [facility], [place]  Dental Screening: Recommended annual dental exams for proper oral hygiene  Community Resource Referral / Chronic Care Management: CRR required this visit?  No   CCM required this visit?  No   Plan:    I have personally reviewed and noted the following in the patient's chart:   Medical and social history Use of alcohol, tobacco or illicit drugs   Current medications and supplements including opioid prescriptions. Patient is not currently taking opioid prescriptions. Functional ability and status Nutritional status Physical activity Advanced directives List of other physicians Hospitalizations, surgeries, and ER visits in previous 12 months Vitals Screenings to include cognitive, depression, and falls Referrals and appointments  In addition, I have reviewed and discussed with patient certain preventive protocols, quality metrics, and best practice recommendations. A written personalized care plan for preventive services as well as general preventive health recommendations were provided to patient.   Nayef College, CMA   10/11/2023   After Visit Summary: (MyChart) Due to this being a telephonic visit, the after visit summary with patients personalized plan was offered to patient via MyChart   Notes:  Requested most recent DM eye exam from [facility], [place] Declined flu, covid, shingles and pneumonia vaccines Declined colonoscopy

## 2023-10-11 NOTE — Patient Instructions (Signed)
 Kenneth Johnston,  Thank you for taking the time for your Medicare Wellness Visit. I appreciate your continued commitment to your health goals. Please review the care plan we discussed, and feel free to reach out if I can assist you further.  Medicare recommends these wellness visits once per year to help you and your care team stay ahead of potential health issues. These visits are designed to focus on prevention, allowing your provider to concentrate on managing your acute and chronic conditions during your regular appointments.  Please note that Annual Wellness Visits do not include a physical exam. Some assessments may be limited, especially if the visit was conducted virtually. If needed, we may recommend a separate in-person follow-up with your provider.  Ongoing Care Seeing your primary care provider every 3 to 6 months helps us  monitor your health and provide consistent, personalized care.   Referrals If a referral was made during today's visit and you haven't received any updates within two weeks, please contact the referred provider directly to check on the status.  Recommended Screenings:  Health Maintenance  Topic Date Due   Yearly kidney health urinalysis for diabetes  10/23/2023   Zoster (Shingles) Vaccine (1 of 2) 10/17/2023*   Flu Shot  04/15/2024*   Pneumococcal Vaccine for age over 76 (1 of 2 - PCV) 07/16/2024*   Eye exam for diabetics  11/23/2023   Hemoglobin A1C  01/17/2024   Complete foot exam   01/25/2024   Cologuard (Stool DNA test)  03/30/2024   Yearly kidney function blood test for diabetes  08/26/2024   Medicare Annual Wellness Visit  10/10/2024   DTaP/Tdap/Td vaccine (2 - Td or Tdap) 01/16/2029   Hepatitis C Screening  Completed   HPV Vaccine  Aged Out   Meningitis B Vaccine  Aged Out   COVID-19 Vaccine  Discontinued  *Topic was postponed. The date shown is not the original due date.       10/11/2023    8:49 AM  Advanced Directives  Does Patient Have a  Medical Advance Directive? No  Would patient like information on creating a medical advance directive? No - Patient declined   Advance Care Planning is important because it: Ensures you receive medical care that aligns with your values, goals, and preferences. Provides guidance to your family and loved ones, reducing the emotional burden of decision-making during critical moments.  Vision: Annual vision screenings are recommended for early detection of glaucoma, cataracts, and diabetic retinopathy. These exams can also reveal signs of chronic conditions such as diabetes and high blood pressure.  Dental: Annual dental screenings help detect early signs of oral cancer, gum disease, and other conditions linked to overall health, including heart disease and diabetes.  Please see the attached documents for additional preventive care recommendations.   Fall Prevention in the Home, Adult Falls can cause injuries and affect people of all ages. There are many simple things that you can do to make your home safe and to help prevent falls. If you need it, ask for help making these changes. What actions can I take to prevent falls? General information Use good lighting in all rooms. Make sure to: Replace any light bulbs that burn out. Turn on lights if it is dark and use night-lights. Keep items that you use often in easy-to-reach places. Lower the shelves around your home if needed. Move furniture so that there are clear paths around it. Do not keep throw rugs or other things on the floor that can make  you trip. If any of your floors are uneven, fix them. Add color or contrast paint or tape to clearly mark and help you see: Grab bars or handrails. First and last steps of staircases. Where the edge of each step is. If you use a ladder or stepladder: Make sure that it is fully opened. Do not climb a closed ladder. Make sure the sides of the ladder are locked in place. Have someone hold the ladder  while you use it. Know where your pets are as you move through your home. What can I do in the bathroom?     Keep the floor dry. Clean up any water that is on the floor right away. Remove soap buildup in the bathtub or shower. Buildup makes bathtubs and showers slippery. Use non-skid mats or decals on the floor of the bathtub or shower. Attach bath mats securely with double-sided, non-slip rug tape. If you need to sit down while you are in the shower, use a non-slip stool. Install grab bars by the toilet and in the bathtub and shower. Do not use towel bars as grab bars. What can I do in the bedroom? Make sure that you have a light by your bed that is easy to reach. Do not use any sheets or blankets on your bed that hang to the floor. Have a firm bench or chair with side arms that you can use for support when you get dressed. What can I do in the kitchen? Clean up any spills right away. If you need to reach something above you, use a sturdy step stool that has a grab bar. Keep electrical cables out of the way. Do not use floor polish or wax that makes floors slippery. What can I do with my stairs? Do not leave anything on the stairs. Make sure that you have a light switch at the top and the bottom of the stairs. Have them installed if you do not have them. Make sure that there are handrails on both sides of the stairs. Fix handrails that are broken or loose. Make sure that handrails are as long as the staircases. Install non-slip stair treads on all stairs in your home if they do not have carpet. Avoid having throw rugs at the top or bottom of stairs, or secure the rugs with carpet tape to prevent them from moving. Choose a carpet design that does not hide the edge of steps on the stairs. Make sure that carpet is firmly attached to the stairs. Fix any carpet that is loose or worn. What can I do on the outside of my home? Use bright outdoor lighting. Repair the edges of walkways and  driveways and fix any cracks. Clear paths of anything that can make you trip, such as tools or rocks. Add color or contrast paint or tape to clearly mark and help you see high doorway thresholds. Trim any bushes or trees on the main path into your home. Check that handrails are securely fastened and in good repair. Both sides of all steps should have handrails. Install guardrails along the edges of any raised decks or porches. Have leaves, snow, and ice cleared regularly. Use sand, salt, or ice melt on walkways during winter months if you live where there is ice and snow. In the garage, clean up any spills right away, including grease or oil spills. What other actions can I take? Review your medicines with your health care provider. Some medicines can make you confused or feel dizzy.  This can increase your chance of falling. Wear closed-toe shoes that fit well and support your feet. Wear shoes that have rubber soles and low heels. Use a cane, walker, scooter, or crutches that help you move around if needed. Talk with your provider about other ways that you can decrease your risk of falls. This may include seeing a physical therapist to learn to do exercises to improve movement and strength. Where to find more information Centers for Disease Control and Prevention, [employer]: TonerPromos.no [employer] on Aging: BaseRingTones.pl National Institute on Aging: BaseRingTones.pl Contact a health care provider if: You are afraid of falling at home. You feel weak, drowsy, or dizzy at home. You fall at home. Get help right away if you: Lose consciousness or have trouble moving after a fall. Have a fall that causes a head injury. These symptoms may be an emergency. Get help right away. Call 911. Do not wait to see if the symptoms will go away. Do not drive yourself to the hospital. This information is not intended to replace advice given to you by your health care provider. Make sure you discuss any questions you  have with your health care provider. Document Revised: 09/05/2021 Document Reviewed: 09/05/2021 Elsevier Patient Education  2024 [employer].

## 2023-10-31 NOTE — Progress Notes (Signed)
 Kenneth Johnston                                          MRN: [id number]   10/31/2023   The [facility] Quality Team Specialist reviewed this patient medical record for the purposes of chart review for care gap closure. The following were reviewed: chart review for care gap closure-kidney health evaluation for diabetes:eGFR  and uACR.    [facility] Quality Team

## 2023-11-06 ENCOUNTER — Other Ambulatory Visit: Payer: Self-pay | Admitting: Family Medicine

## 2023-11-08 ENCOUNTER — Other Ambulatory Visit: Payer: Self-pay | Admitting: Cardiovascular Disease

## 2023-11-08 NOTE — Telephone Encounter (Signed)
 Requested Prescriptions  Pending Prescriptions Disp Refills   levothyroxine  (SYNTHROID ) 75 MCG tablet [Pharmacy Med Name: Levothyroxine  Sodium 75 MCG Oral Tablet] 100 tablet 2    Sig: TAKE 1 TABLET BY MOUTH DAILY  BEFORE BREAKFAST     Endocrinology:  Hypothyroid Agents Passed - 11/08/2023  3:11 PM      Passed - TSH in normal range and within 360 days    TSH  Date Value Ref Range Status  07/17/2023 2.290 0.450 - 4.500 uIU/mL Final         Passed - Valid encounter within last 12 months    Recent Outpatient Visits           2 months ago Primary hypertension   [facility] [place], Megan P, DO   3 months ago Controlled type 2 diabetes mellitus with complication, without long-term current use of insulin (HCC)   [facility] [place], Megan P, DO   4 months ago Primary hypertension   [place] [facility] [place], Megan P, DO   6 months ago Controlled type 2 diabetes mellitus with complication, without long-term current use of insulin [facility])    [facility] [place], [place], DO

## 2023-11-19 ENCOUNTER — Ambulatory Visit: Admitting: Family Medicine

## 2023-11-19 ENCOUNTER — Encounter: Payer: Self-pay | Admitting: Family Medicine

## 2023-11-19 VITALS — BP 132/82 | HR 74 | Temp 97.9°F | Ht 75.0 in | Wt 232.4 lb

## 2023-11-19 DIAGNOSIS — D692 Other nonthrombocytopenic purpura: Secondary | ICD-10-CM

## 2023-11-19 DIAGNOSIS — R3911 Hesitancy of micturition: Secondary | ICD-10-CM

## 2023-11-19 DIAGNOSIS — Z1211 Encounter for screening for malignant neoplasm of colon: Secondary | ICD-10-CM

## 2023-11-19 DIAGNOSIS — E118 Type 2 diabetes mellitus with unspecified complications: Secondary | ICD-10-CM | POA: Diagnosis not present

## 2023-11-19 DIAGNOSIS — R195 Other fecal abnormalities: Secondary | ICD-10-CM

## 2023-11-19 DIAGNOSIS — Z0000 Encounter for general adult medical examination without abnormal findings: Secondary | ICD-10-CM | POA: Diagnosis not present

## 2023-11-19 DIAGNOSIS — I10 Essential (primary) hypertension: Secondary | ICD-10-CM | POA: Diagnosis not present

## 2023-11-19 DIAGNOSIS — E790 Hyperuricemia without signs of inflammatory arthritis and tophaceous disease: Secondary | ICD-10-CM

## 2023-11-19 DIAGNOSIS — E039 Hypothyroidism, unspecified: Secondary | ICD-10-CM

## 2023-11-19 DIAGNOSIS — E782 Mixed hyperlipidemia: Secondary | ICD-10-CM

## 2023-11-19 DIAGNOSIS — F339 Major depressive disorder, recurrent, unspecified: Secondary | ICD-10-CM

## 2023-11-19 DIAGNOSIS — N1831 Chronic kidney disease, stage 3a: Secondary | ICD-10-CM

## 2023-11-19 DIAGNOSIS — J069 Acute upper respiratory infection, unspecified: Secondary | ICD-10-CM

## 2023-11-19 LAB — MICROALBUMIN, URINE WAIVED
Creatinine, Urine Waived: 300 mg/dL (ref 10–300)
Microalb, Ur Waived: 150 mg/L — ABNORMAL HIGH (ref 0–19)

## 2023-11-19 LAB — BAYER DCA HB A1C WAIVED: HB A1C (BAYER DCA - WAIVED): 5.5 % (ref 4.8–5.6)

## 2023-11-19 MED ORDER — EZETIMIBE 10 MG PO TABS: 10.0000 mg | ORAL_TABLET | Freq: Every day | ORAL | 3 refills | Status: AC

## 2023-11-19 NOTE — Progress Notes (Signed)
 BP 132/82 (BP Location: Left Arm, Cuff Size: Large)   Pulse 74   Temp 97.9 F (36.6 C) (Oral)   Ht 6' 3 (1.905 m)   Wt 232 lb 6.4 oz (105.4 kg)   SpO2 96%   BMI 29.05 kg/m    Subjective:    Patient ID: Kenneth Johnston, male    DOB: Dec 30, 1950, 73 y.o.   MRN: [id number]  HPI: Kenneth Johnston is a 73 y.o. male presenting on 11/19/2023 for comprehensive medical examination. Current medical complaints include:  UPPER RESPIRATORY TRACT INFECTION Duration: couple of days Worst symptom: congestion, cough Fever: yes Cough: yes Shortness of breath: no Wheezing: no Chest pain: no Chest tightness: no Chest congestion: no Nasal congestion: yes Runny nose: no Post nasal drip: yes Sneezing: no Sore throat: no Swollen glands: no Sinus pressure: no Headache: no Face pain: no Toothache: no Ear pain: no  Ear pressure: no  Eyes red/itching:no Eye drainage/crusting: no  Vomiting: no Rash: no Fatigue: yes Sick contacts: no Strep contacts: no  Context: better Recurrent sinusitis: no Relief with OTC cold/cough medications: no  Treatments attempted: cold/sinus, mucinex, and anti-histamine   DIABETES Hypoglycemic episodes:no Polydipsia/polyuria: no Visual disturbance: no Chest pain: no Paresthesias: no Glucose Monitoring: no  Accucheck frequency: Not Checking Taking Insulin?: no Blood Pressure Monitoring: a few times a month Retinal Examination: Up to Date Foot Exam: Not up to Date Diabetic Education: Not Completed Pneumovax: Not up to Date Influenza: Not up to Date Aspirin : yes  HYPERTENSION / HYPERLIPIDEMIA Satisfied with current treatment? yes Duration of hypertension: chronic BP monitoring frequency: not checking BP medication side effects: no Past BP meds: amlodipine , lisinopril  Duration of hyperlipidemia: chronic Cholesterol medication side effects: no Past cholesterol medications: zetia  Medication compliance: excellent compliance Aspirin : yes Recent  stressors: no Recurrent headaches: no Visual changes: no Palpitations: no Dyspnea: no Chest pain: no Lower extremity edema: no Dizzy/lightheaded: no  HYPOTHYROIDISM Thyroid  control status:controlled Satisfied with current treatment? yes Medication side effects: no Medication compliance: excellent compliance Recent dose adjustment:no Fatigue: no Cold intolerance: no Heat intolerance: no Weight gain: no Weight loss: no Constipation: no Diarrhea/loose stools: no Palpitations: no Lower extremity edema: no Anxiety/depressed mood: no  Interim Problems from his last visit: no  Depression Screen done today and results listed below:     11/19/2023    1:30 PM 10/11/2023    8:47 AM 06/14/2023   11:00 AM 04/25/2023   11:15 AM 12/04/2022    9:16 AM  Depression screen PHQ 2/9  Decreased Interest 0 0 0 0 0  Down, Depressed, Hopeless 0 0 0 0 0  PHQ - 2 Score 0 0 0 0 0  Altered sleeping 0 0 0 0 0  Tired, decreased energy 0 0 0 0 0  Change in appetite 0 0 0 0 0  Feeling bad or failure about yourself  0 0 0 0 0  Trouble concentrating 0 0 0 0 0  Moving slowly or fidgety/restless 0 0 0 0 0  Suicidal thoughts 0 0 0 0 0  PHQ-9 Score 0  0  0  0  0   Difficult doing work/chores Not difficult at all Not difficult at all   Not difficult at all     Data saved with a previous flowsheet row definition     Past Medical History:  Past Medical History:  Diagnosis Date   Allergy    Arthritis 20 years   CKD (chronic kidney disease), stage III (HCC)  Coronary artery calcification    Hyperlipidemia 15 years ago   Hypertension 12 years ago   Hypothyroidism    Umbilical hernia 03/04/2015    Surgical History:  Past Surgical History:  Procedure Laterality Date   KNEE CARTILAGE SURGERY Bilateral     Medications:  Current Outpatient Medications on File Prior to Visit  Medication Sig   allopurinol  (ZYLOPRIM ) 100 MG tablet Take 1 tablet (100 mg total) by mouth daily.   aspirin  EC 81 MG  tablet Take 1 tablet (81 mg total) by mouth daily.   cyclobenzaprine  (FLEXERIL ) 10 MG tablet Take 1 tablet (10 mg total) by mouth at bedtime.   FLUoxetine  (PROZAC ) 20 MG capsule Take 1 capsule (20 mg total) by mouth daily.   levothyroxine  (SYNTHROID ) 75 MCG tablet TAKE 1 TABLET BY MOUTH DAILY  BEFORE BREAKFAST   lisinopril  (ZESTRIL ) 40 MG tablet Take 1 tablet (40 mg total) by mouth daily.   meloxicam  (MOBIC ) 15 MG tablet Take 1 tablet (15 mg total) by mouth daily. (Patient taking differently: Take 15 mg by mouth daily. PRN)   omeprazole  (PRILOSEC) 40 MG capsule Take 1 capsule (40 mg total) by mouth daily.   No current facility-administered medications on file prior to visit.    Allergies:  No Known Allergies  Social History:  Social History   Socioeconomic History   Marital status: Married    Spouse name: Alisa   Number of children: 2   Years of education: Not on file   Highest education level: Associate degree: occupational, scientist, product/process development, or vocational program  Occupational History   Not on file  Tobacco Use   Smoking status: Former    Types: Pipe, Cigars   Smokeless tobacco: Never  Vaping Use   Vaping status: Never Used  Substance and Sexual Activity   Alcohol use: Not Currently    Comment: very seldom, 1 drink monthly or less   Drug use: No   Sexual activity: Not Currently  Other Topics Concern   Not on file  Social History Narrative   [name]    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   Social Drivers of Corporate Investment Banker Strain: Low Risk  (11/12/2023)   Overall Financial Resource Strain (CARDIA)    Difficulty of Paying Living Expenses: Not hard at all  Food Insecurity: No Food Insecurity (11/12/2023)   Hunger Vital Sign    Worried About Running Out of Food in the Last Year: Never true    Ran Out of Food in the Last Year: Never true  Transportation Needs: No Transportation Needs (11/12/2023)   PRAPARE - Administrator, Civil Service  (Medical): No    Lack of Transportation (Non-Medical): No  Physical Activity: Insufficiently Active (11/12/2023)   Exercise Vital Sign    Days of Exercise per Week: 3 days    Minutes of Exercise per Session: 40 min  Stress: No Stress Concern Present (11/12/2023)   [employer] of Occupational Health - Occupational Stress Questionnaire    Feeling of Stress: Not at all  Social Connections: Socially Integrated (11/12/2023)   Social Connection and Isolation Panel    Frequency of Communication with Friends and Family: More than three times a week    Frequency of Social Gatherings with Friends and Family: Twice a week    Attends Religious Services: More than 4 times per year    Active Member of [employer] or Organizations: Yes    Attends Banker Meetings: More than 4 times  per year    Marital Status: Married  Catering Manager Violence: Not At Risk (10/11/2023)   Humiliation, Afraid, Rape, and Kick questionnaire    Fear of Current or Ex-Partner: No    Emotionally Abused: No    Physically Abused: No    Sexually Abused: No   Social History   Tobacco Use  Smoking Status Former   Types: Pipe, Cigars  Smokeless Tobacco Never   Social History   Substance and Sexual Activity  Alcohol Use Not Currently   Comment: very seldom, 1 drink monthly or less    Family History:  Family History  Problem Relation Age of Onset   Kidney failure Mother    Aneurysm Father        brain   Kidney disease Paternal Grandmother    Heart attack Paternal Grandfather     Past medical history, surgical history, medications, allergies, family history and social history reviewed with patient today and changes made to appropriate areas of the chart.   Review of Systems  Constitutional:  Positive for fever. Negative for chills, diaphoresis, malaise/fatigue and weight loss.  HENT:  Positive for congestion. Negative for ear discharge, ear pain, hearing loss, nosebleeds, sinus pain, sore throat and  tinnitus.   Eyes: Negative.   Respiratory:  Positive for cough. Negative for hemoptysis, sputum production, shortness of breath, wheezing and stridor.   Cardiovascular: Negative.   Gastrointestinal: Negative.   Genitourinary: Negative.   Musculoskeletal: Negative.   Skin: Negative.   Neurological: Negative.   Endo/Heme/Allergies:  Positive for environmental allergies. Negative for polydipsia. Bruises/bleeds easily.  Psychiatric/Behavioral: Negative.     All other ROS negative except what is listed above and in the HPI.      Objective:    BP 132/82 (BP Location: Left Arm, Cuff Size: Large)   Pulse 74   Temp 97.9 F (36.6 C) (Oral)   Ht 6' 3 (1.905 m)   Wt 232 lb 6.4 oz (105.4 kg)   SpO2 96%   BMI 29.05 kg/m   Wt Readings from Last 3 Encounters:  11/19/23 232 lb 6.4 oz (105.4 kg)  10/11/23 228 lb (103.4 kg)  08/27/23 227 lb (103 kg)    Physical Exam Vitals and nursing note reviewed.  Constitutional:      General: He is not in acute distress.    Appearance: Normal appearance. He is not ill-appearing, toxic-appearing or diaphoretic.  HENT:     Head: Normocephalic and atraumatic.     Right Ear: Tympanic membrane, ear canal and external ear normal. There is no impacted cerumen.     Left Ear: Tympanic membrane, ear canal and external ear normal. There is no impacted cerumen.     Nose: Nose normal. No congestion or rhinorrhea.     Mouth/Throat:     Mouth: Mucous membranes are moist.     Pharynx: Oropharynx is clear. No oropharyngeal exudate or posterior oropharyngeal erythema.  Eyes:     General: No scleral icterus.       Right eye: No discharge.        Left eye: No discharge.     Extraocular Movements: Extraocular movements intact.     Conjunctiva/sclera: Conjunctivae normal.     Pupils: Pupils are equal, round, and reactive to light.  Neck:     Vascular: No carotid bruit.  Cardiovascular:     Rate and Rhythm: Normal rate and regular rhythm.     Pulses: Normal pulses.      Heart sounds: No murmur heard.  No friction rub. No gallop.  Pulmonary:     Effort: Pulmonary effort is normal. No respiratory distress.     Breath sounds: Normal breath sounds. No stridor. No wheezing, rhonchi or rales.  Chest:     Chest wall: No tenderness.  Abdominal:     General: Abdomen is flat. Bowel sounds are normal. There is no distension.     Palpations: Abdomen is soft. There is no mass.     Tenderness: There is no abdominal tenderness. There is no right CVA tenderness, left CVA tenderness, guarding or rebound.     Hernia: No hernia is present.  Genitourinary:    Comments: Genital exam deferred with shared decision making Musculoskeletal:        General: No swelling, tenderness, deformity or signs of injury.     Cervical back: Normal range of motion and neck supple. No rigidity. No muscular tenderness.     Right lower leg: No edema.     Left lower leg: No edema.  Lymphadenopathy:     Cervical: No cervical adenopathy.  Skin:    General: Skin is warm and dry.     Capillary Refill: Capillary refill takes less than 2 seconds.     Coloration: Skin is not jaundiced or pale.     Findings: No bruising, erythema, lesion or rash.  Neurological:     General: No focal deficit present.     Mental Status: He is alert and oriented to person, place, and time.     Cranial Nerves: No cranial nerve deficit.     Sensory: No sensory deficit.     Motor: No weakness.     Coordination: Coordination normal.     Gait: Gait normal.     Deep Tendon Reflexes: Reflexes normal.  Psychiatric:        Mood and Affect: Mood normal.        Behavior: Behavior normal.        Thought Content: Thought content normal.        Judgment: Judgment normal.     Results for orders placed or performed in visit on 11/19/23  Bayer DCA Hb A1c Waived   Collection Time: 11/19/23  1:30 PM  Result Value Ref Range   HB A1C (BAYER DCA - WAIVED) 5.5 4.8 - 5.6 %  Microalbumin, Urine Waived   Collection Time:  11/19/23  1:30 PM  Result Value Ref Range   Microalb, Ur Waived 150 (H) 0 - 19 mg/L   Creatinine, Urine Waived 300 10 - 300 mg/dL   Microalb/Creat Ratio 30-300 (H) <30 mg/g  Comprehensive metabolic panel with GFR   Collection Time: 11/19/23  1:31 PM  Result Value Ref Range   Glucose 105 (H) 70 - 99 mg/dL   BUN 17 8 - 27 mg/dL   Creatinine, Ser 8.35 (H) 0.76 - 1.27 mg/dL   eGFR 44 (L) >40 fO/fpw/8.26   BUN/Creatinine Ratio 10 10 - 24   Sodium 143 134 - 144 mmol/L   Potassium 3.9 3.5 - 5.2 mmol/L   Chloride 101 96 - 106 mmol/L   CO2 25 20 - 29 mmol/L   Calcium  9.6 8.6 - 10.2 mg/dL   Total Protein 6.9 6.0 - 8.5 g/dL   Albumin 4.3 3.8 - 4.8 g/dL   Globulin, Total 2.6 1.5 - 4.5 g/dL   Bilirubin Total 0.4 0.0 - 1.2 mg/dL   Alkaline Phosphatase 75 47 - 123 IU/L   AST 12 0 - 40 IU/L   ALT 11 0 -  44 IU/L  CBC with Differential/Platelet   Collection Time: 11/19/23  1:31 PM  Result Value Ref Range   WBC 9.7 3.4 - 10.8 x10E3/uL   RBC 5.02 4.14 - 5.80 x10E6/uL   Hemoglobin 15.0 13.0 - 17.7 g/dL   Hematocrit 54.6 62.4 - 51.0 %   MCV 90 79 - 97 fL   MCH 29.9 26.6 - 33.0 pg   MCHC 33.1 31.5 - 35.7 g/dL   RDW 86.8 88.3 - 84.5 %   Platelets 260 150 - 450 x10E3/uL   Neutrophils 74 Not Estab. %   Lymphs 16 Not Estab. %   Monocytes 7 Not Estab. %   Eos 2 Not Estab. %   Basos 1 Not Estab. %   Neutrophils Absolute 7.2 (H) 1.4 - 7.0 x10E3/uL   Lymphocytes Absolute 1.6 0.7 - 3.1 x10E3/uL   Monocytes Absolute 0.7 0.1 - 0.9 x10E3/uL   EOS (ABSOLUTE) 0.2 0.0 - 0.4 x10E3/uL   Basophils Absolute 0.1 0.0 - 0.2 x10E3/uL   Immature Granulocytes 0 Not Estab. %   Immature Grans (Abs) 0.0 0.0 - 0.1 x10E3/uL  Lipid Panel w/o Chol/HDL Ratio   Collection Time: 11/19/23  1:31 PM  Result Value Ref Range   Cholesterol, Total 155 100 - 199 mg/dL   Triglycerides 832 (H) 0 - 149 mg/dL   HDL 32 (L) >60 mg/dL   VLDL Cholesterol Cal 29 5 - 40 mg/dL   LDL Chol Calc (NIH) 94 0 - 99 mg/dL  PSA   Collection  Time: 11/19/23  1:31 PM  Result Value Ref Range   Prostate Specific Ag, Serum 11.4 (H) 0.0 - 4.0 ng/mL  TSH   Collection Time: 11/19/23  1:31 PM  Result Value Ref Range   TSH 2.800 0.450 - 4.500 uIU/mL  Uric acid   Collection Time: 11/19/23  1:31 PM  Result Value Ref Range   Uric Acid 7.3 3.8 - 8.4 mg/dL      Assessment & Plan:   Problem List Items Addressed This Visit       Cardiovascular and Mediastinum   HTN (hypertension)   Under good control on current regimen. Continue current regimen. Continue to monitor. Call with any concerns. Refills given. Labs drawn today.        Relevant Medications   ezetimibe  (ZETIA ) 10 MG tablet   Other Relevant Orders   Comprehensive metabolic panel with GFR (Completed)   CBC with Differential/Platelet (Completed)   Senile purpura   Reassured patient. Continue to monitor.       Relevant Medications   ezetimibe  (ZETIA ) 10 MG tablet   Other Relevant Orders   Comprehensive metabolic panel with GFR (Completed)   CBC with Differential/Platelet (Completed)     Endocrine   Hypothyroidism   Rechecking labs today. Await results. Treat as needed.      Relevant Orders   Comprehensive metabolic panel with GFR (Completed)   CBC with Differential/Platelet (Completed)   TSH (Completed)   Controlled type 2 diabetes mellitus with complication, without long-term current use of insulin (HCC)   Doing great with A1c of 5.5. Continue current regimen. Continue to monitor. Call with any concerns.       Relevant Orders   Comprehensive metabolic panel with GFR (Completed)   CBC with Differential/Platelet (Completed)   Bayer DCA Hb A1c Waived (Completed)   Microalbumin, Urine Waived (Completed)     Genitourinary   Chronic kidney disease, stage 3a (HCC)   Rechecking labs today. Await results. Treat as needed.  Relevant Orders   Comprehensive metabolic panel with GFR (Completed)   CBC with Differential/Platelet (Completed)     Other    Hyperlipidemia   Under good control on current regimen. Continue current regimen. Continue to monitor. Call with any concerns. Refills given. Labs drawn today.       Relevant Medications   ezetimibe  (ZETIA ) 10 MG tablet   Other Relevant Orders   Comprehensive metabolic panel with GFR (Completed)   CBC with Differential/Platelet (Completed)   Lipid Panel w/o Chol/HDL Ratio (Completed)   Elevated uric acid in blood   Rechecking labs today. Await results. Treat as needed.      Relevant Orders   Comprehensive metabolic panel with GFR (Completed)   CBC with Differential/Platelet (Completed)   Uric acid (Completed)   Depression, recurrent   Under good control on current regimen. Continue current regimen. Continue to monitor. Call with any concerns. Refills given. Labs drawn today.        Relevant Orders   Comprehensive metabolic panel with GFR (Completed)   CBC with Differential/Platelet (Completed)   Other Visit Diagnoses       Routine general medical examination at a health care facility    -  Primary   Vaccines up to date/delined. Screening labs checked today. Colonoscopy ordered. Continue to monitor. Call with any concerns.     Hesitancy       Labs drawn todays. Await results.   Relevant Orders   PSA (Completed)     Upper respiratory tract infection, unspecified type       Lungs clear. Improving. Will continue to monitor. Call with any concerns.     Positive colorectal cancer screening using Cologuard test       Referral to GI placed today. Await their input.   Relevant Orders   Ambulatory referral to Gastroenterology     Screening for colon cancer       Referral to GI placed today. Await their input.   Relevant Orders   Ambulatory referral to Gastroenterology        Discussed aspirin  prophylaxis for myocardial infarction prevention and decision was made to continue ASA  LABORATORY TESTING:  Health maintenance labs ordered today as discussed above.   The natural  history of prostate cancer and ongoing controversy regarding screening and potential treatment outcomes of prostate cancer has been discussed with the patient. The meaning of a false positive PSA and a false negative PSA has been discussed. He indicates understanding of the limitations of this screening test and wishes to proceed with screening PSA testing.   IMMUNIZATIONS:   - Tdap: Tetanus vaccination status reviewed: last tetanus booster within 10 years. - Influenza: Refused - Pneumovax: Refused - Prevnar: Refused - COVID: Refused - HPV: Not applicable - Shingrix vaccine: Refused  SCREENING: - Colonoscopy: Ordered today  Discussed with patient purpose of the colonoscopy is to detect colon cancer at curable precancerous or early stages   PATIENT COUNSELING:    Sexuality: Discussed sexually transmitted diseases, partner selection, use of condoms, avoidance of unintended pregnancy  and contraceptive alternatives.   Advised to avoid cigarette smoking.  I discussed with the patient that most people either abstain from alcohol or drink within safe limits (<=14/week and <=4 drinks/occasion for males, <=7/weeks and <= 3 drinks/occasion for females) and that the risk for alcohol disorders and other health effects rises proportionally with the number of drinks per week and how often a drinker exceeds daily limits.  Discussed cessation/primary prevention of drug use and  availability of treatment for abuse.   Diet: Encouraged to adjust caloric intake to maintain  or achieve ideal body weight, to reduce intake of dietary saturated fat and total fat, to limit sodium intake by avoiding high sodium foods and not adding table salt, and to maintain adequate dietary potassium and calcium  preferably from fresh fruits, vegetables, and low-fat dairy products.    stressed the importance of regular exercise  Injury prevention: Discussed safety belts, safety helmets, smoke detector, smoking near bedding or  upholstery.   Dental health: Discussed importance of regular tooth brushing, flossing, and dental visits.   Follow up plan: NEXT PREVENTATIVE PHYSICAL DUE IN 1 YEAR. Return in about 3 months (around 02/19/2024).

## 2023-11-20 ENCOUNTER — Telehealth: Payer: Self-pay

## 2023-11-20 LAB — TSH: TSH: 2.8 u[IU]/mL (ref 0.450–4.500)

## 2023-11-20 LAB — CBC WITH DIFFERENTIAL/PLATELET
Basophils Absolute: 0.1 x10E3/uL (ref 0.0–0.2)
Basos: 1 %
EOS (ABSOLUTE): 0.2 x10E3/uL (ref 0.0–0.4)
Eos: 2 %
Hematocrit: 45.3 % (ref 37.5–51.0)
Hemoglobin: 15 g/dL (ref 13.0–17.7)
Immature Grans (Abs): 0 x10E3/uL (ref 0.0–0.1)
Immature Granulocytes: 0 %
Lymphocytes Absolute: 1.6 x10E3/uL (ref 0.7–3.1)
Lymphs: 16 %
MCH: 29.9 pg (ref 26.6–33.0)
MCHC: 33.1 g/dL (ref 31.5–35.7)
MCV: 90 fL (ref 79–97)
Monocytes Absolute: 0.7 x10E3/uL (ref 0.1–0.9)
Monocytes: 7 %
Neutrophils Absolute: 7.2 x10E3/uL — ABNORMAL HIGH (ref 1.4–7.0)
Neutrophils: 74 %
Platelets: 260 x10E3/uL (ref 150–450)
RBC: 5.02 x10E6/uL (ref 4.14–5.80)
RDW: 13.1 % (ref 11.6–15.4)
WBC: 9.7 x10E3/uL (ref 3.4–10.8)

## 2023-11-20 LAB — COMPREHENSIVE METABOLIC PANEL WITH GFR
ALT: 11 IU/L (ref 0–44)
AST: 12 IU/L (ref 0–40)
Albumin: 4.3 g/dL (ref 3.8–4.8)
Alkaline Phosphatase: 75 IU/L (ref 47–123)
BUN/Creatinine Ratio: 10 (ref 10–24)
BUN: 17 mg/dL (ref 8–27)
Bilirubin Total: 0.4 mg/dL (ref 0.0–1.2)
CO2: 25 mmol/L (ref 20–29)
Calcium: 9.6 mg/dL (ref 8.6–10.2)
Chloride: 101 mmol/L (ref 96–106)
Creatinine, Ser: 1.64 mg/dL — ABNORMAL HIGH (ref 0.76–1.27)
Globulin, Total: 2.6 g/dL (ref 1.5–4.5)
Glucose: 105 mg/dL — ABNORMAL HIGH (ref 70–99)
Potassium: 3.9 mmol/L (ref 3.5–5.2)
Sodium: 143 mmol/L (ref 134–144)
Total Protein: 6.9 g/dL (ref 6.0–8.5)
eGFR: 44 mL/min/1.73 — ABNORMAL LOW (ref >59)

## 2023-11-20 LAB — URIC ACID: Uric Acid: 7.3 mg/dL (ref 3.8–8.4)

## 2023-11-20 LAB — PSA: Prostate Specific Ag, Serum: 11.4 ng/mL — ABNORMAL HIGH (ref 0.0–4.0)

## 2023-11-20 LAB — LIPID PANEL W/O CHOL/HDL RATIO
Cholesterol, Total: 155 mg/dL (ref 100–199)
HDL: 32 mg/dL — ABNORMAL LOW (ref >39)
LDL Chol Calc (NIH): 94 mg/dL (ref 0–99)
Triglycerides: 167 mg/dL — ABNORMAL HIGH (ref 0–149)
VLDL Cholesterol Cal: 29 mg/dL (ref 5–40)

## 2023-11-20 NOTE — Telephone Encounter (Signed)
 Copied from CRM [id number]. Topic: Clinical - Medical Advice >> Nov 20, 2023  1:20 PM Roselie BROCKS wrote: Reason for CRM: Patients wife Clearnce Leja) calls to let provider know issues going on with the patients penis. She states its noticeably swollen for the last 3 months, and he is not circumcised, and the PSA test is doubled to 11.4. 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 And he is also urinating more.  She states the patient does not want to mention it in appointments and just wants to pass the information along.

## 2023-11-22 ENCOUNTER — Encounter: Payer: Self-pay | Admitting: Family Medicine

## 2023-11-22 ENCOUNTER — Ambulatory Visit: Payer: Self-pay | Admitting: Family Medicine

## 2023-11-22 MED ORDER — TAMSULOSIN HCL 0.4 MG PO CAPS: 0.4000 mg | ORAL_CAPSULE | Freq: Every day | ORAL | 3 refills | Status: DC

## 2023-11-22 NOTE — Progress Notes (Signed)
 Called patient to see if the appt that I have scheduled on 12/18 at 1120 would be ok with him.

## 2023-11-25 NOTE — Assessment & Plan Note (Signed)
 Rechecking labs today. Await results. Treat as needed.

## 2023-11-25 NOTE — Assessment & Plan Note (Signed)
 Under good control on current regimen. Continue current regimen. Continue to monitor. Call with any concerns. Refills given. Labs drawn today.

## 2023-11-25 NOTE — Assessment & Plan Note (Signed)
 Reassured patient. Continue to monitor.

## 2023-11-25 NOTE — Assessment & Plan Note (Signed)
 Doing great with A1c of 5.5. Continue current regimen. Continue to monitor. Call with any concerns.

## 2023-12-08 LAB — MICROALBUMIN / CREATININE URINE RATIO: Microalb Creat Ratio: 66

## 2023-12-19 ENCOUNTER — Encounter: Payer: Self-pay | Admitting: Family Medicine

## 2024-01-03 ENCOUNTER — Encounter: Payer: Self-pay | Admitting: Family Medicine

## 2024-01-03 ENCOUNTER — Ambulatory Visit: Admitting: Family Medicine

## 2024-01-03 VITALS — BP 142/78 | HR 66 | Temp 97.5°F | Ht 75.0 in | Wt 234.8 lb

## 2024-01-03 DIAGNOSIS — F339 Major depressive disorder, recurrent, unspecified: Secondary | ICD-10-CM

## 2024-01-03 DIAGNOSIS — N401 Enlarged prostate with lower urinary tract symptoms: Secondary | ICD-10-CM | POA: Diagnosis not present

## 2024-01-03 DIAGNOSIS — R351 Nocturia: Secondary | ICD-10-CM | POA: Diagnosis not present

## 2024-01-03 MED ORDER — FLUOXETINE HCL 20 MG PO CAPS: 40.0000 mg | ORAL_CAPSULE | Freq: Every day | ORAL | 1 refills | Status: AC

## 2024-01-03 NOTE — Progress Notes (Signed)
 BP (!) 142/78   Pulse 66   Temp (!) 97.5 F (36.4 C) (Oral)   Ht 6' 3 (1.905 m)   Wt 234 lb 12.8 oz (106.5 kg)   SpO2 98%   BMI 29.35 kg/m    Subjective:    Patient ID: Kenneth Johnston, male    DOB: [birth year], 73 y.o.   MRN: [id number]  HPI: Kenneth Johnston is a 73 y.o. male  Chief Complaint  Patient presents with   Benign Prostatic Hypertrophy   BPH BPH status: better Satisfied with current treatment?: yes Medication side effects: yes Medication compliance: excellent compliance Duration: chronic Nocturia: 1/night Urinary frequency:no Incomplete voiding: no Urgency: no Weak urinary stream: no Straining to start stream: no Dysuria: no Onset: gradual Severity: mild  DEPRESSION Mood status: better Satisfied with current treatment?: no Symptom severity: mild  Duration of current treatment : months Side effects: no Medication compliance: excellent compliance Psychotherapy/counseling: no  Previous psychiatric medications: prozac  Depressed mood: yes Anxious mood: yes Anhedonia: no Significant weight loss or gain: no Insomnia: no  Fatigue: yes Feelings of worthlessness or guilt: no Impaired concentration/indecisiveness: no Suicidal ideations: no Hopelessness: no Crying spells: no    11/19/2023    1:30 PM 10/11/2023    8:47 AM 06/14/2023   11:00 AM 04/25/2023   11:15 AM 12/04/2022    9:16 AM  Depression screen PHQ 2/9  Decreased Interest 0 0 0 0 0  Down, Depressed, Hopeless 0 0 0 0 0  PHQ - 2 Score 0 0 0 0 0  Altered sleeping 0 0 0 0 0  Tired, decreased energy 0 0 0 0 0  Change in appetite 0 0 0 0 0  Feeling bad or failure about yourself  0 0 0 0 0  Trouble concentrating 0 0 0 0 0  Moving slowly or fidgety/restless 0 0 0 0 0  Suicidal thoughts 0 0 0 0 0  PHQ-9 Score 0  0  0  0  0   Difficult doing work/chores Not difficult at all Not difficult at all   Not difficult at all     Data saved with a previous flowsheet row definition     Relevant  past medical, surgical, family and social history reviewed and updated as indicated. Interim medical history since our last visit reviewed. Allergies and medications reviewed and updated.  Review of Systems  Constitutional: Negative.   Respiratory: Negative.    Cardiovascular: Negative.   Genitourinary: Negative.   Musculoskeletal:  Positive for arthralgias. Negative for back pain, gait problem, joint swelling, myalgias, neck pain and neck stiffness.  Skin: Negative.   Psychiatric/Behavioral: Negative.      Per HPI unless specifically indicated above     Objective:    BP (!) 142/78   Pulse 66   Temp (!) 97.5 F (36.4 C) (Oral)   Ht 6' 3 (1.905 m)   Wt 234 lb 12.8 oz (106.5 kg)   SpO2 98%   BMI 29.35 kg/m   Wt Readings from Last 3 Encounters:  01/03/24 234 lb 12.8 oz (106.5 kg)  11/19/23 232 lb 6.4 oz (105.4 kg)  10/11/23 228 lb (103.4 kg)    Physical Exam Vitals and nursing note reviewed.  Constitutional:      General: He is not in acute distress.    Appearance: Normal appearance. He is not ill-appearing, toxic-appearing or diaphoretic.  HENT:     Head: Normocephalic and atraumatic.     Right Ear: External ear normal.  Left Ear: External ear normal.     Nose: Nose normal.     Mouth/Throat:     Mouth: Mucous membranes are moist.     Pharynx: Oropharynx is clear.  Eyes:     General: No scleral icterus.       Right eye: No discharge.        Left eye: No discharge.     Extraocular Movements: Extraocular movements intact.     Conjunctiva/sclera: Conjunctivae normal.     Pupils: Pupils are equal, round, and reactive to light.  Cardiovascular:     Rate and Rhythm: Normal rate and regular rhythm.     Pulses: Normal pulses.     Heart sounds: Normal heart sounds. No murmur heard.    No friction rub. No gallop.  Pulmonary:     Effort: Pulmonary effort is normal. No respiratory distress.     Breath sounds: Normal breath sounds. No stridor. No wheezing, rhonchi or  rales.  Chest:     Chest wall: No tenderness.  Musculoskeletal:        General: Normal range of motion.     Cervical back: Normal range of motion and neck supple.  Skin:    General: Skin is warm and dry.     Capillary Refill: Capillary refill takes less than 2 seconds.     Coloration: Skin is not jaundiced or pale.     Findings: No bruising, erythema, lesion or rash.  Neurological:     General: No focal deficit present.     Mental Status: He is alert and oriented to person, place, and time. Mental status is at baseline.  Psychiatric:        Mood and Affect: Mood normal.        Behavior: Behavior normal.        Thought Content: Thought content normal.        Judgment: Judgment normal.     Results for orders placed or performed in visit on 11/19/23  Bayer DCA Hb A1c Waived   Collection Time: 11/19/23  1:30 PM  Result Value Ref Range   HB A1C (BAYER DCA - WAIVED) 5.5 4.8 - 5.6 %  Microalbumin, Urine Waived   Collection Time: 11/19/23  1:30 PM  Result Value Ref Range   Microalb, Ur Waived 150 (H) 0 - 19 mg/L   Creatinine, Urine Waived 300 10 - 300 mg/dL   Microalb/Creat Ratio 30-300 (H) <30 mg/g  Comprehensive metabolic panel with GFR   Collection Time: 11/19/23  1:31 PM  Result Value Ref Range   Glucose 105 (H) 70 - 99 mg/dL   BUN 17 8 - 27 mg/dL   Creatinine, Ser 8.35 (H) 0.76 - 1.27 mg/dL   eGFR 44 (L) >40 fO/fpw/8.26   BUN/Creatinine Ratio 10 10 - 24   Sodium 143 134 - 144 mmol/L   Potassium 3.9 3.5 - 5.2 mmol/L   Chloride 101 96 - 106 mmol/L   CO2 25 20 - 29 mmol/L   Calcium  9.6 8.6 - 10.2 mg/dL   Total Protein 6.9 6.0 - 8.5 g/dL   Albumin 4.3 3.8 - 4.8 g/dL   Globulin, Total 2.6 1.5 - 4.5 g/dL   Bilirubin Total 0.4 0.0 - 1.2 mg/dL   Alkaline Phosphatase 75 47 - 123 IU/L   AST 12 0 - 40 IU/L   ALT 11 0 - 44 IU/L  CBC with Differential/Platelet   Collection Time: 11/19/23  1:31 PM  Result Value Ref Range   WBC  9.7 3.4 - 10.8 x10E3/uL   RBC 5.02 4.14 - 5.80  x10E6/uL   Hemoglobin 15.0 13.0 - 17.7 g/dL   Hematocrit 54.6 62.4 - 51.0 %   MCV 90 79 - 97 fL   MCH 29.9 26.6 - 33.0 pg   MCHC 33.1 31.5 - 35.7 g/dL   RDW 86.8 88.3 - 84.5 %   Platelets 260 150 - 450 x10E3/uL   Neutrophils 74 Not Estab. %   Lymphs 16 Not Estab. %   Monocytes 7 Not Estab. %   Eos 2 Not Estab. %   Basos 1 Not Estab. %   Neutrophils Absolute 7.2 (H) 1.4 - 7.0 x10E3/uL   Lymphocytes Absolute 1.6 0.7 - 3.1 x10E3/uL   Monocytes Absolute 0.7 0.1 - 0.9 x10E3/uL   EOS (ABSOLUTE) 0.2 0.0 - 0.4 x10E3/uL   Basophils Absolute 0.1 0.0 - 0.2 x10E3/uL   Immature Granulocytes 0 Not Estab. %   Immature Grans (Abs) 0.0 0.0 - 0.1 x10E3/uL  Lipid Panel w/o Chol/HDL Ratio   Collection Time: 11/19/23  1:31 PM  Result Value Ref Range   Cholesterol, Total 155 100 - 199 mg/dL   Triglycerides 832 (H) 0 - 149 mg/dL   HDL 32 (L) >60 mg/dL   VLDL Cholesterol Cal 29 5 - 40 mg/dL   LDL Chol Calc (NIH) 94 0 - 99 mg/dL  PSA   Collection Time: 11/19/23  1:31 PM  Result Value Ref Range   Prostate Specific Ag, Serum 11.4 (H) 0.0 - 4.0 ng/mL  TSH   Collection Time: 11/19/23  1:31 PM  Result Value Ref Range   TSH 2.800 0.450 - 4.500 uIU/mL  Uric acid   Collection Time: 11/19/23  1:31 PM  Result Value Ref Range   Uric Acid 7.3 3.8 - 8.4 mg/dL      Assessment & Plan:   Problem List Items Addressed This Visit       Other   Depression, recurrent   Would like to increase his prozac  to 40mg - Rx sent to his pharmacy. Call with any concerns.       Relevant Medications   FLUoxetine  (PROZAC ) 20 MG capsule   Benign prostatic hyperplasia with nocturia - Primary   Doing much better on the flomax . Will recheck BMP and PSA- if still running high will increase his flomax . Call with any concerns.       Relevant Orders   PSA   Basic metabolic panel with GFR     Follow up plan: Return for As scheduled.

## 2024-01-03 NOTE — Assessment & Plan Note (Signed)
 Doing much better on the flomax . Will recheck BMP and PSA- if still running high will increase his flomax . Call with any concerns.

## 2024-01-03 NOTE — Assessment & Plan Note (Signed)
 Would like to increase his prozac  to 40mg - Rx sent to his pharmacy. Call with any concerns.

## 2024-01-04 ENCOUNTER — Ambulatory Visit: Payer: Self-pay | Admitting: Family Medicine

## 2024-01-04 LAB — BASIC METABOLIC PANEL WITH GFR
BUN/Creatinine Ratio: 12 (ref 10–24)
BUN: 16 mg/dL (ref 8–27)
CO2: 22 mmol/L (ref 20–29)
Calcium: 9.5 mg/dL (ref 8.6–10.2)
Chloride: 103 mmol/L (ref 96–106)
Creatinine, Ser: 1.31 mg/dL — ABNORMAL HIGH (ref 0.76–1.27)
Glucose: 105 mg/dL — ABNORMAL HIGH (ref 70–99)
Potassium: 4.3 mmol/L (ref 3.5–5.2)
Sodium: 142 mmol/L (ref 134–144)
eGFR: 57 mL/min/1.73 — ABNORMAL LOW

## 2024-01-04 LAB — PSA: Prostate Specific Ag, Serum: 10.4 ng/mL — ABNORMAL HIGH (ref 0.0–4.0)

## 2024-01-04 MED ORDER — TAMSULOSIN HCL 0.4 MG PO CAPS: 0.8000 mg | ORAL_CAPSULE | Freq: Every day | ORAL | 3 refills | Status: AC

## 2024-01-07 ENCOUNTER — Encounter: Payer: Self-pay | Admitting: Family Medicine

## 2024-01-08 NOTE — Telephone Encounter (Signed)
 Patient has picked up this morning.

## 2024-01-23 ENCOUNTER — Ambulatory Visit: Admission: RE | Admit: 2024-01-23 | Admitting: Gastroenterology

## 2024-01-28 ENCOUNTER — Other Ambulatory Visit: Payer: Self-pay | Admitting: Family Medicine

## 2024-01-28 NOTE — Progress Notes (Signed)
 Kenneth Johnston                                          MRN: [id number]   01/28/2024   The [facility] Quality Team Specialist reviewed this patient medical record for the purposes of chart review for care gap closure. The following were reviewed: abstraction for care gap closure-glycemic status assessment.    [facility] Quality Team

## 2024-02-05 ENCOUNTER — Other Ambulatory Visit: Payer: Self-pay

## 2024-02-05 NOTE — Telephone Encounter (Signed)
 [facility] Refill Encounter  Last office visit: 11/15/2023  Verified last visit within 6 months and next visit is scheduled: Yes   Next office visit: 02/19/2024   Patient record reviewed by CMA and medication due for refill: Yes  Requested Prescriptions   Pending Prescriptions Disp Refills   lisinopril  (ZESTRIL ) 40 MG tablet 100 tablet 1    Sig: Take 1 tablet (40 mg total) by mouth daily.

## 2024-02-05 NOTE — Telephone Encounter (Signed)
 [facility] Refill Encounter  Last office visit: 01/03/2024  Verified last visit within 6 months and next visit is scheduled: Yes   Next office visit: 02/19/2024   Patient record reviewed by CMA and medication due for refill: Yes  Requested Prescriptions   Pending Prescriptions Disp Refills   meloxicam  (MOBIC ) 15 MG tablet 100 tablet 1    Sig: Take 1 tablet (15 mg total) by mouth daily.

## 2024-02-06 ENCOUNTER — Other Ambulatory Visit: Payer: Self-pay | Admitting: Family Medicine

## 2024-02-06 NOTE — Telephone Encounter (Signed)
 Requested by interface surescripts. Future visit 02/19/24.  Requested Prescriptions  Pending Prescriptions Disp Refills   allopurinol  (ZYLOPRIM ) 100 MG tablet [Pharmacy Med Name: Allopurinol  100 MG Oral Tablet] 100 tablet 2    Sig: TAKE 1 TABLET BY MOUTH DAILY     Endocrinology:  Gout Agents - allopurinol  Failed - 02/06/2024 12:04 PM      Failed - Cr in normal range and within 360 days    Creatinine, Ser  Date Value Ref Range Status  01/03/2024 1.31 (H) 0.76 - 1.27 mg/dL Final         Passed - Uric Acid in normal range and within 360 days    Uric Acid  Date Value Ref Range Status  11/19/2023 7.3 3.8 - 8.4 mg/dL Final    Comment:               Therapeutic target for gout patients: <6.0         Passed - Valid encounter within last 12 months    Recent Outpatient Visits           1 month ago Benign prostatic hyperplasia with nocturia   [facility] [place], Megan P, DO   2 months ago Routine general medical examination at a health care facility   [facility], Megan P, DO   5 months ago Primary hypertension   [facility] [place], [place] P, DO   6 months ago Controlled type 2 diabetes mellitus with complication, without long-term current use of insulin (HCC)   [facility] [place], Megan P, DO   7 months ago Primary hypertension    [facility] [place], Megan P, DO              Passed - CBC within normal limits and completed in the last 12 months    WBC  Date Value Ref Range Status  11/19/2023 9.7 3.4 - 10.8 x10E3/uL Final   RBC  Date Value Ref Range Status  11/19/2023 5.02 4.14 - 5.80 x10E6/uL Final   Hemoglobin  Date Value Ref Range Status  11/19/2023 15.0 13.0 - 17.7 g/dL Final   Hematocrit  Date Value Ref Range Status  11/19/2023 45.3 37.5 - 51.0 % Final   MCHC  Date Value Ref Range Status  11/19/2023 33.1 31.5 - 35.7 g/dL Final    [facility]  Date Value Ref Range Status  11/19/2023 29.9 26.6 - 33.0 pg Final   MCV  Date Value Ref Range Status  11/19/2023 90 79 - 97 fL Final   No results found for: PLTCOUNTKUC, LABPLAT, POCPLA RDW  Date Value Ref Range Status  11/19/2023 13.1 11.6 - 15.4 % Final

## 2024-02-13 ENCOUNTER — Other Ambulatory Visit: Payer: Self-pay | Admitting: Family Medicine

## 2024-02-14 NOTE — Telephone Encounter (Signed)
 Requested Prescriptions  Pending Prescriptions Disp Refills   meloxicam  (MOBIC ) 15 MG tablet [Pharmacy Med Name: Meloxicam  15 MG Oral Tablet] 100 tablet 1    Sig: TAKE 1 TABLET BY MOUTH DAILY     Analgesics:  COX2 Inhibitors Failed - 02/14/2024  3:22 PM      Failed - Manual Review: Labs are only required if the patient has taken medication for more than 8 weeks.      Failed - Cr in normal range and within 360 days    Creatinine, Ser  Date Value Ref Range Status  01/03/2024 1.31 (H) 0.76 - 1.27 mg/dL Final         Passed - HGB in normal range and within 360 days    Hemoglobin  Date Value Ref Range Status  11/19/2023 15.0 13.0 - 17.7 g/dL Final         Passed - HCT in normal range and within 360 days    Hematocrit  Date Value Ref Range Status  11/19/2023 45.3 37.5 - 51.0 % Final         Passed - AST in normal range and within 360 days    AST  Date Value Ref Range Status  11/19/2023 12 0 - 40 IU/L Final         Passed - ALT in normal range and within 360 days    ALT  Date Value Ref Range Status  11/19/2023 11 0 - 44 IU/L Final         Passed - eGFR is 30 or above and within 360 days    GFR calc Af Amer  Date Value Ref Range Status  10/09/2019 64 >59 mL/min/1.73 Final    Comment:    **[facility] currently reports eGFR in compliance with the current**   recommendations of the [employer]. [facility] will   update reporting as new guidelines are published from the NKF-ASN   Task force.    GFR calc non Af Amer  Date Value Ref Range Status  10/09/2019 55 (L) >59 mL/min/1.73 Final   eGFR  Date Value Ref Range Status  01/03/2024 57 (L) >59 mL/min/1.73 Final         Passed - Patient is not pregnant      Passed - Valid encounter within last 12 months    Recent Outpatient Visits           1 month ago Benign prostatic hyperplasia with nocturia   [facility] [place], Megan P, DO   2 months ago Routine general medical examination at  a health care facility   [facility], Megan P, DO   5 months ago Primary hypertension   [facility] [place], [place] P, DO   7 months ago Controlled type 2 diabetes mellitus with complication, without long-term current use of insulin (HCC)   [facility] [place], Megan P, DO   8 months ago Primary hypertension   [facility] [place], Megan P, DO               lisinopril  (ZESTRIL ) 40 MG tablet [Pharmacy Med Name: Lisinopril  40 MG Oral Tablet] 100 tablet 1    Sig: TAKE 1 TABLET BY MOUTH DAILY     Cardiovascular:  ACE Inhibitors Failed - 02/14/2024  3:22 PM      Failed - Cr in normal range and within 180 days    Creatinine, Ser  Date Value  Ref Range Status  01/03/2024 1.31 (H) 0.76 - 1.27 mg/dL Final         Failed - Last BP in normal range    BP Readings from Last 1 Encounters:  01/03/24 (!) 142/78         Passed - K in normal range and within 180 days    Potassium  Date Value Ref Range Status  01/03/2024 4.3 3.5 - 5.2 mmol/L Final         Passed - Patient is not pregnant      Passed - Valid encounter within last 6 months    Recent Outpatient Visits           1 month ago Benign prostatic hyperplasia with nocturia   [facility] [place], Megan P, DO   2 months ago Routine general medical examination at a health care facility   [facility] [place], Megan P, DO   5 months ago Primary hypertension   [facility] [place], [place] P, DO   7 months ago Controlled type 2 diabetes mellitus with complication, without long-term current use of insulin [facility])   [facility] [place], Megan P, DO   8 months ago Primary hypertension   [facility] [place], [place], DO

## 2024-02-19 ENCOUNTER — Ambulatory Visit: Admitting: Family Medicine

## 2024-02-19 ENCOUNTER — Encounter: Payer: Self-pay | Admitting: Family Medicine

## 2024-02-19 VITALS — BP 117/74 | HR 75 | Temp 97.8°F | Resp 18 | Ht 75.0 in | Wt 233.0 lb

## 2024-02-19 DIAGNOSIS — N401 Enlarged prostate with lower urinary tract symptoms: Secondary | ICD-10-CM

## 2024-02-19 DIAGNOSIS — E1129 Type 2 diabetes mellitus with other diabetic kidney complication: Secondary | ICD-10-CM

## 2024-02-19 DIAGNOSIS — F339 Major depressive disorder, recurrent, unspecified: Secondary | ICD-10-CM

## 2024-02-19 LAB — BAYER DCA HB A1C WAIVED: HB A1C (BAYER DCA - WAIVED): 5.6 % (ref 4.8–5.6)

## 2024-02-19 NOTE — Assessment & Plan Note (Signed)
 Doing much better on higher dose of flomax . Rechecking labs today. Await results. Treat as needed.

## 2024-02-19 NOTE — Assessment & Plan Note (Signed)
 Doing great with A1c of 5.6. Continue current regimen. Continue management of diabetes for microalbumin. Call with any concerns.

## 2024-02-19 NOTE — Assessment & Plan Note (Signed)
 Under good control on current regimen. Continue current regimen. Continue to monitor. Call with any concerns. Refills given.

## 2024-02-20 LAB — BASIC METABOLIC PANEL WITH GFR
BUN/Creatinine Ratio: 12 (ref 10–24)
BUN: 17 mg/dL (ref 8–27)
CO2: 21 mmol/L (ref 20–29)
Calcium: 9.5 mg/dL (ref 8.6–10.2)
Chloride: 106 mmol/L (ref 96–106)
Creatinine, Ser: 1.47 mg/dL — ABNORMAL HIGH (ref 0.76–1.27)
Glucose: 96 mg/dL (ref 70–99)
Potassium: 3.9 mmol/L (ref 3.5–5.2)
Sodium: 143 mmol/L (ref 134–144)
eGFR: 49 mL/min/{1.73_m2} — ABNORMAL LOW

## 2024-02-20 LAB — PSA: Prostate Specific Ag, Serum: 11.2 ng/mL — ABNORMAL HIGH (ref 0.0–4.0)

## 2024-02-21 ENCOUNTER — Encounter: Payer: Self-pay | Admitting: Family Medicine

## 2024-08-18 ENCOUNTER — Ambulatory Visit: Admitting: Family Medicine

## 2024-10-16 ENCOUNTER — Ambulatory Visit
